# Patient Record
Sex: Female | Born: 1945
Health system: Southern US, Community
[De-identification: ages and names within clinical notes are randomized; demographics above are authoritative.]

## PROBLEM LIST (undated history)

## (undated) DIAGNOSIS — T7840XA Allergy, unspecified, initial encounter: Secondary | ICD-10-CM

## (undated) DIAGNOSIS — M1990 Unspecified osteoarthritis, unspecified site: Secondary | ICD-10-CM

## (undated) DIAGNOSIS — J383 Other diseases of vocal cords: Secondary | ICD-10-CM

## (undated) DIAGNOSIS — Z86718 Personal history of other venous thrombosis and embolism: Secondary | ICD-10-CM

## (undated) DIAGNOSIS — N950 Postmenopausal bleeding: Secondary | ICD-10-CM

## (undated) DIAGNOSIS — K219 Gastro-esophageal reflux disease without esophagitis: Secondary | ICD-10-CM

## (undated) DIAGNOSIS — F419 Anxiety disorder, unspecified: Secondary | ICD-10-CM

## (undated) HISTORY — DX: Other diseases of vocal cords: J38.3

## (undated) HISTORY — PX: EYE SURGERY: SHX253

## (undated) HISTORY — PX: AUGMENTATION MAMMAPLASTY: SUR837

## (undated) HISTORY — DX: Gastro-esophageal reflux disease without esophagitis: K21.9

## (undated) HISTORY — PX: COSMETIC SURGERY: SHX468

## (undated) HISTORY — PX: APPENDECTOMY: SHX54

## (undated) HISTORY — DX: Anxiety disorder, unspecified: F41.9

## (undated) HISTORY — PX: OTHER SURGICAL HISTORY: SHX169

## (undated) HISTORY — PX: CATARACT EXTRACTION W/ INTRAOCULAR LENS  IMPLANT, BILATERAL: SHX1307

## (undated) HISTORY — DX: Allergy, unspecified, initial encounter: T78.40XA

## (undated) HISTORY — PX: JOINT REPLACEMENT: SHX530

## (undated) HISTORY — PX: TONSILLECTOMY: SUR1361

---

## 1997-08-04 ENCOUNTER — Other Ambulatory Visit: Admission: RE | Admit: 1997-08-04 | Discharge: 1997-08-04 | Payer: Self-pay | Admitting: Obstetrics and Gynecology

## 1998-08-06 ENCOUNTER — Other Ambulatory Visit: Admission: RE | Admit: 1998-08-06 | Discharge: 1998-08-06 | Payer: Self-pay | Admitting: Obstetrics and Gynecology

## 1999-09-09 ENCOUNTER — Other Ambulatory Visit: Admission: RE | Admit: 1999-09-09 | Discharge: 1999-09-09 | Payer: Self-pay | Admitting: Obstetrics and Gynecology

## 2000-02-02 HISTORY — PX: BREAST ENHANCEMENT SURGERY: SHX7

## 2000-04-17 ENCOUNTER — Inpatient Hospital Stay: Admission: EM | Admit: 2000-04-17 | Discharge: 2000-04-19 | Payer: Self-pay | Admitting: Family Medicine

## 2000-04-18 ENCOUNTER — Encounter: Payer: Self-pay | Admitting: Family Medicine

## 2000-10-02 ENCOUNTER — Other Ambulatory Visit: Admission: RE | Admit: 2000-10-02 | Discharge: 2000-10-02 | Payer: Self-pay | Admitting: Obstetrics and Gynecology

## 2001-11-20 ENCOUNTER — Other Ambulatory Visit: Admission: RE | Admit: 2001-11-20 | Discharge: 2001-11-20 | Payer: Self-pay | Admitting: Obstetrics and Gynecology

## 2002-12-24 ENCOUNTER — Other Ambulatory Visit: Admission: RE | Admit: 2002-12-24 | Discharge: 2002-12-24 | Payer: Self-pay | Admitting: Obstetrics and Gynecology

## 2003-04-04 HISTORY — PX: ROTATOR CUFF REPAIR: SHX139

## 2003-09-02 ENCOUNTER — Encounter: Admission: RE | Admit: 2003-09-02 | Discharge: 2003-12-01 | Payer: Self-pay

## 2003-11-25 ENCOUNTER — Ambulatory Visit (HOSPITAL_COMMUNITY): Admission: RE | Admit: 2003-11-25 | Discharge: 2003-11-25 | Payer: Self-pay | Admitting: Gastroenterology

## 2003-11-25 ENCOUNTER — Encounter (INDEPENDENT_AMBULATORY_CARE_PROVIDER_SITE_OTHER): Payer: Self-pay | Admitting: Specialist

## 2003-12-24 ENCOUNTER — Other Ambulatory Visit: Admission: RE | Admit: 2003-12-24 | Discharge: 2003-12-24 | Payer: Self-pay | Admitting: Obstetrics and Gynecology

## 2005-01-31 ENCOUNTER — Other Ambulatory Visit: Admission: RE | Admit: 2005-01-31 | Discharge: 2005-01-31 | Payer: Self-pay | Admitting: Obstetrics and Gynecology

## 2009-04-03 HISTORY — PX: KNEE ARTHROSCOPY W/ MENISCECTOMY: SHX1879

## 2009-11-29 ENCOUNTER — Encounter: Admission: RE | Admit: 2009-11-29 | Discharge: 2009-11-29 | Payer: Self-pay | Admitting: Family Medicine

## 2010-08-19 NOTE — H&P (Signed)
[facility]  Patient:    Tamara Pope, Tamara Pope                         MRN: [id number] Adm. Date:  [id number] Attending:  Pamelia Hoit                         History and Physical  IDENTIFICATION:  [age]-year-old married white female from [place].  CHIEF COMPLAINT:  Swelling and tightness of the right upper arm starting this morning.  HISTORY OF PRESENT ILLNESS:  Patient was in her usual excellent state of health until this morning, when she woke up with a feeling of tightness and swelling in her right upper arm.  She noticed that it appeared swollen and slightly bruised.  It was not painful.  She came to the office, from where she was sent to [facility] where a CT scan and ultrasound were done of the area which revealed venous thrombosis; she is now admitted for further evaluation and treatment.  Two months ago, she had a replacement of bilateral breast implants.  Silicone implants which were leaking were removed and new silicone implants replaced without incident.  She had good healing without complications.  She has not had any kind of trauma since then.  Yesterday, she lifted some heavy pots x 2 and last night, she felt as if she may have strained a muscle in her arm.  She has not had any fever or chills or respiratory symptoms.  No pain.  PAST MEDICAL HISTORY:  No previous history of blood clotting problems.  Breast implants placed 13 years ago, replaced 10 years ago and 2 months ago.  No other surgery.  She has migraine headaches and allergies.  She is status post menopause and on hormone-replacement therapy.  Prempro was changed recently to femhrt.  Other medicines include Inderal LA to prevent migraines, Allegra-D once a day and Flonase one or two sprays per nostril daily.  SOCIAL HISTORY:  She has never smoked cigarettes and rarely drinks alcohol. 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  They travel a lot and recently went to [place] without incident; plan to go to [place] and [place] in one week.  They have two girls who are grown and well.  No grandchildren.  Patient works out regularly doing aerobics with no recent overuse or change in schedule.  ALLERGIES: She has an intolerance to STEROIDS, which cause her to feel jittery.  No known allergies.  FAMILY HISTORY:  Patients father died at age 71 of lung cancer.  Her mother is living at age 3 with rheumatoid arthritis.  She has one older brother who has diabetes associated with being overweight.  PHYSICAL EXAMINATION  GENERAL:  Examination reveals an attractive, young-for-age white female in no distress.  She is not obese.  Mental status is normal.  VITAL SIGNS:  Temperature 97.3, pulse 66, respirations 18, blood pressure 117/63.  Weight is 67 kg.  SKIN:  Her skin is tanned without lesions.  EXTREMITIES:  The right upper arm is swollen, slightly bruised and reddish in appearance.  The diameter of the upper and lower right arm is greater than that of the left arm.  She has full active range of motion of both arms and shoulders with normal strength bilaterally.  There is no tenderness or heat over the upper right arm.  Lower extremities are normal with no  edema.  Good pulses in feet and ankles and there are no joint abnormalities anywhere.  LYMPHATICS:  There is no axillary tenderness, mass or adenopathy.  No drainage.  There are no lymph nodes palpated at the neck or supraclavicular areas.  HEENT:  Unremarkable.  LUNGS:  Clear to auscultation.  HEART:  Regular without murmurs or extra sounds.  BREASTS:  No bruising, inflammation or swelling.  ABDOMEN:  Soft, nontender, without masses.  GU:  Deferred.  RECTAL:  Deferred.  LABORATORY AND X-RAY FINDINGS:  White count 12,000, 68% neutrophils, 23% lymphocytes, hemoglobin 14.1, platelets 303,000, MCV 88.6.  Pro time INR 1.0; PTT  26, normal.  Sodium 140, potassium 3.4, glucose 104, BUN 8, creatinine 0.6, calcium 9.8.  Liver functions are normal.  CT scan and ultrasound of right arm and shoulder are noted above but there is no written report here.  ASSESSMENT 1. Deep venous thrombosis with right upper arm with no recent trauma and no    history of same. 2. Hormone-replacement therapy. 3. Seasonal allergies. 4. Two months status post silicone breast implant replacements. 5. Migraine headaches.  PLAN:  Will obtain x-ray reports from [facility].  Heparin is started, as is Coumadin.  Tomorrow, we will change to Lovenox and plan for outpatient management when stable.  Will obtain hematology consultation for further advise.  For now, will hold off on hormone-replacement therapy but continue other medicines. DD:  04/17/00 TD:  04/18/00 Job: [id number]

## 2010-08-19 NOTE — Consult Note (Signed)
[facility]  Patient:    Tamara Pope, Tamara Pope                         MRN: [id number] Adm. Date:  [id number] Attending:  Pamelia Hoit                          Consultation Report  REFERRING PHYSICIAN:          Benedetto Goad, M.D.  REASON FOR CONSULT:           New onset right upper extremity DVT.  HISTORY OF CURRENT ILLNESS:   Patient was in a usual state of excellent health until morning of admission, January 15, when she began to feel tightness and pain, swelling in her right upper arm; it was swollen and slightly bruised. It was not very painful. She had a workup to include a CT scan and ultrasound of the area which revealed an extensive venous thrombosis. She was admitted for heparin therapy and is currently switched over to Lovenox while she is being Coumandinized.  SIGNIFICANT RECENT HISTORY:  Two months ago she had replacement of bilateral breast implants. Silicone implants were leaking. They were removed and new silicone implants were replaced without an incident. She has had no complications since then. She had good healing. She has had no trauma. She has had no period of immobilization with her upper extremities. Of note, she has recently, in the past two years, changed her hormone replacement therapy from Prempro to [facility].  PAST HISTORY:                 Significant for no history of blood clotting problems, breast implants placed 13 years ago, replaced 10 years ago and two months ago. She has had no other surgeries. She has migraine headaches and allergies. She is status post menopause and on hormone replacement therapy.  SOCIAL HISTORY:               She has never smoked cigarettes. She rarely drinks alcohol. She travels extensively. They have two grown children. She works out regularly doing aerobics. She has maintained good preventive health including mammograms which have been normal as well as Pap smears. She is currently on hormone  replacement therapy.  ALLERGIES:                    Has as intolerance to STEROIDS which cause her to feel jittery. She has no other known drug allergies.  FAMILY HISTORY:               Father died at age of 61 with lung cancer. Her mother is living at age of 14 with rheumatoid arthritis. She has one older brother who has diabetes associated with being overweight.  REVIEW OF SYSTEMS:            Right upper extremity swelling. Otherwise, no headache; no dizziness; no shortness of breath; no bowel or bladder changes; no focal neurologic complaints; no difficulty ambulating; no weight loss; no fever, chills; no night sweats.  PHYSICAL EXAMINATION:  VITAL SIGNS:                  She is afebrile. Vitals are stable.  CHEST:                        Clear.  HEART:  Regular rate and rhythm.  ABDOMEN:                      Soft, nontender to palpation. No hepatosplenomegaly by palpation.  EXTREMITIES:                  Full range of motion. No edema. She does have significant edema throughout the right upper extremity with some bruising.  NEURO:                        Alert and oriented x 3. Cranial nerves 2-12 grossly intact. Motor and sensory are intact globally.  IMPRESSION:                   Female with new onset right upper extremity deep vein thrombosis while on hormone replacement therapy. No known significant risk factors or hypercoagulability.  PLAN:                         At this point, I certainly agree with stopping her hormone replacement therapy and continue Coumadin for at least three months. During this period, I would certainly like to establish care with her and at the end of her Coumandinization, once her parameters have normalized, would consider a hypercoagulable workup; however, I believe this would be very low yield. However, it would reassure [place] that this secondary to the hormone replacement therapy. DD:  04/18/00 TD:  04/18/00 Job:  [id number]

## 2010-08-19 NOTE — Discharge Summary (Signed)
[facility]  Patient:    Tamara Pope, Tamara Pope                         MRN: [id number] Adm. Date:  [id number] Disc. Date: [id number] Attending:  Pamelia Hoit CC:         Delorse Lek, M.D. [facility]  Marnee Guarneri, M.D. Hematology   Discharge Summary  ADMISSION DIAGNOSIS:  Right upper extremity thrombosis.  DISCHARGE DIAGNOSES: 1. Thrombosis of the right axillary and subclavian vein, probably secondary to    postmenopausal hormone replacement therapy. 2. History of seasonal allergic rhinitis. 3. History of silicone breast implants two months prior to this admission. 4. History of migraine headaches.  HISTORY OF PRESENT ILLNESS:  This is a 65 year old married female from [place] who presented to the office on April 17, 2000, after she woke up feeling some tightness and swelling in her right upper extremity.  It also appeared to be slightly bruised.  There was no history of any pain, shortness of breath, or chest pain.  She did not recall any previous injury.  She was sent to [facility] where CT and ultrasound were done which showed an area of thrombosis within the entire axillary and subclavian vein with sluggish flow in the brachial vein.  The jugular vein was unremarkable.  She was admitted for IV heparin and to initiate Coumadin therapy.  There is no prior history of known clotting disorder.  She is a nonsmoker and rarely drinks alcohol.  ADMISSION LABORATORY DATA:  Her electrolytes were all normal with the exception of a minimally depressed potassium of 3.4.  Initial INR 1.0 with PTT of 26.  White blood count 12,000 with hemoglobin of 14.1, and platelet count of 303,000.  HOSPITAL COURSE:  The patient was admitted, and IV heparin was started initially, along with Coumadin 10 mg.  She was then switched over to Lovenox 70 mg subcutaneously q.12h. and Coumadin continued.  Hematology consult was obtained with  Dr. Aliene Altes who felt that the hormone replacement therapy was probably contributing to the clot, and that it would be less likely that she has a significant hypercoagulable state.  The patient did not have any complications during admission.  In fact, had really no pain whatsoever involving her upper extremity.  INR on the day of discharge, April 19, 2000, was 1.6.  The patient was able to inject her own Lovenox without any difficulty, and it was felt that she would have no problems maintaining this at home.  CONDITION ON DISCHARGE:  Stable.  DISCHARGE MEDICATIONS: 1. Coumadin 5 mg p.o. q.d. 2. Lovenox 80 mg subcutaneously b.i.d. until Coumadin therapeutic. 3. Allegra-D one b.i.d. 4. Inderal LA 80 mg p.o. q.d. 5. Flonase nasal 2 sprays to nostril q.d.  DISCHARGE INSTRUCTIONS: 1. She is instructed to do no heavy lifting. 2. Follow up promptly if she has any shortness of breath, chest pain, fevers,    or other difficulties.  DISCHARGE FOLLOWUP:  Saturday, April 21, 2000, in our office for repeat prothrombin time, and again Monday morning for repeat prothrombin time as well. DD:  04/19/00 TD:  04/20/00 Job: [id number]

## 2010-08-19 NOTE — Op Note (Signed)
[name], Tamara Pope                          ACCOUNT NO.:  [id number]   MEDICAL RECORD NO.:  [id number]                   PATIENT TYPE:  AMB   LOCATION:  ENDO                                 FACILITY:  [facility]   PHYSICIAN:  Anselmo Rod, M.D.               DATE OF BIRTH:  1945/12/25   DATE OF PROCEDURE:  11/25/2003  DATE OF DISCHARGE:                                 OPERATIVE REPORT   PROCEDURE PERFORMED:  Colonoscopy with snare polypectomy x1 and cold  biopsies x3.   ENDOSCOPIST:  Anselmo Rod, M.D.   INSTRUMENT USED:  Olympus video colonoscope.   INDICATIONS FOR PROCEDURE:  A 65 year old white female with a history of  constipation and laxative abuse undergoing a screening colonoscopy, rule out  colonic polyps, masses, etc..  There is no known family history of colon  cancer.   PREPROCEDURE PHYSICAL:  VITAL SIGNS:  Stable vital signs.  NECK:  Supple.  CHEST:  Clear to auscultation.  CARDIOVASCULAR:  S1 and S2 regular.  ABDOMEN:  Soft with normal bowel sounds.   DESCRIPTION OF PROCEDURE:  The patient was placed in left lateral decubitus  position, sedated with 60 mg of Demerol and 6 mg of Versed in slow  incremental doses.  Once the patient was adequately sedated and maintained  on low flow oxygen and continuous cardiac monitoring, the Olympus video  colonoscope was advanced from the rectum to the cecum with slight difficulty  but the patient had severe melanosis coli throughout the colon.  A small  sessile polyp was snared from 50 cm.  Three small polyps were biopsied from  40 to 50 cm.  They were all placed in the same bottle.  The appendiceal  orifice and ileocecal valve were clearly visualized and photographed.  Small  internal hemorrhoids were seen on retroflexion.  No masses or polyps were  seen in the transverse colon, right colon or cecum.  There were a few early  left-sided diverticula.  The patient tolerated the procedure well without  any immediate  complications.   IMPRESSION:  1. Small nonbleeding internal hemorrhoids.  2. Early left-sided diverticulosis.  3. One polyp snared from 50 cm and three small polyps biopsied from 40 to 50     cm.  4. Normal-appearing transverse colon, right colon and cecum, except for     severe melanosis coli throughout the colonic mucosa.   RECOMMENDATIONS:  1. Continue high fiber diet with liberal fluid intake.  2. Avoid nonsteroidals for the next four weeks.  3. Avoid laxatives in the future.  4. Avoid pathology results.  5. Repeat CR6 reading depending on pathology results.  6. Outpatient follow-up in the next two weeks or earlier if need be.  Anselmo Rod, M.D.    JNM/MEDQ  D:  11/25/2003  T:  11/25/2003  Job:  [id number]   cc:   Marjory Lies, M.D.  [address]  [place]  [place] [address]  Fax: [phone]

## 2011-10-11 DIAGNOSIS — Z9889 Other specified postprocedural states: Secondary | ICD-10-CM | POA: Insufficient documentation

## 2011-11-14 DIAGNOSIS — Z961 Presence of intraocular lens: Secondary | ICD-10-CM | POA: Insufficient documentation

## 2011-12-21 DIAGNOSIS — M1710 Unilateral primary osteoarthritis, unspecified knee: Secondary | ICD-10-CM | POA: Insufficient documentation

## 2011-12-21 DIAGNOSIS — S83249A Other tear of medial meniscus, current injury, unspecified knee, initial encounter: Secondary | ICD-10-CM | POA: Insufficient documentation

## 2012-12-02 HISTORY — PX: OTHER SURGICAL HISTORY: SHX169

## 2013-04-16 ENCOUNTER — Encounter (HOSPITAL_BASED_OUTPATIENT_CLINIC_OR_DEPARTMENT_OTHER): Payer: Self-pay | Admitting: *Deleted

## 2013-04-16 NOTE — Progress Notes (Signed)
NPO AFTER MN. ARRIVE AT 0700. NEEDS CBC.

## 2013-04-16 NOTE — H&P (Signed)
Tamara Pope  DICTATION # [id number] CSN# [id number]   [name], MD 04/16/2013 1:16 PM

## 2013-04-17 NOTE — H&P (Signed)
NAMEFABIAN, COCA                ACCOUNT NO.:  [id number]  MEDICAL RECORD NO.:  [id number]  LOCATION:                                 FACILITY:  PHYSICIAN:  Ralene Bathe. Matthew Saras, M.D.DATE OF BIRTH:  1945-10-31  DATE OF ADMISSION: DATE OF DISCHARGE:                             HISTORY & PHYSICAL   CHIEF COMPLAINT:  Postmenopausal bleeding.  HISTORY OF PRESENT ILLNESS:  A 68 year old, G2, P2, postmenopausal patient who is on HRT, she has been on Minivelle and nightly progesterone, which has worked extremely well for her without prior problems related to postmenopausal bleeding.  Was on vacation last week, when she experienced some cramping and bleeding, was told to come into the office this week for evaluation with [facility].  Sonohysterogram in our office demonstrated possible adenomyosis with question of a 2.1-cm intramural fibroid with some blood clot noted.  On saline infusion, could not see the cavity well due to some bleeding and clots.  She presents now for hysteroscopy, D and C, with possible resection of polyp or small fibroid depending on the findings.  This procedure including specific risks related to bleeding, infection, other complications or may require additional surgery discussed with her, which she understands and accepts.  Pap in April 2014 was normal.  PAST MEDICAL HISTORY:  ALLERGIES:  None.  CURRENT MEDICATIONS:  Effexor, Minivelle and nightly progesterone.  OBSTETRICAL HISTORY:  Two vaginal deliveries.  She has had prior breast augmentation.  Last mammogram showed a possible leaking implant, which was followed up by her plastic surgeon.  She has also had appendectomy, tonsillectomy and rotator cuff repair.  SOCIAL HISTORY:  Denies drug or tobacco use, one alcoholic drink per day.  REVIEW OF SYSTEMS:  Significant for arthritis, prior meniscus tear.  FAMILY HISTORY:  Significant for unspecified cancer and arthritis.  PHYSICAL EXAMINATION:  VITAL SIGNS:   Temperature 98.2, blood pressure 120/72. HEENT:  Unremarkable. NECK:  Supple without masses. LUNGS:  Clear. CARDIOVASCULAR:  Regular rate and rhythm without murmurs, rubs, or gallops. BREASTS:  Without masses.  She does have implants. ABDOMEN:  Soft, flat, nontender.  Vulva, vagina, cervix normal except for some small amount of clotting at the internal or at the external os. Uterus itself was upper limit of normal size.  Adnexa negative. EXTREMITIES:  Unremarkable. NEUROLOGIC:  Unremarkable.  IMPRESSION:  Postmenopausal bleeding, currently on hormone replacement therapy, possible adenomyosis or endometrial polyps versus submucous fibroid.  PLAN:  D and C, hysteroscopy with resectoscope.  Procedure and risks discussed as above.     Kalaya Infantino M. Matthew Saras, M.D.     RMH/MEDQ  D:  04/16/2013  T:  04/17/2013  Job:  [id number]

## 2013-04-18 ENCOUNTER — Ambulatory Visit (HOSPITAL_BASED_OUTPATIENT_CLINIC_OR_DEPARTMENT_OTHER)
Admission: RE | Admit: 2013-04-18 | Discharge: 2013-04-18 | Disposition: A | Discharge status: Home / Self Care | Payer: PRIVATE HEALTH INSURANCE | Source: Ambulatory Visit | Attending: Obstetrics and Gynecology | Admitting: Obstetrics and Gynecology

## 2013-04-18 ENCOUNTER — Encounter (HOSPITAL_BASED_OUTPATIENT_CLINIC_OR_DEPARTMENT_OTHER): Payer: Self-pay

## 2013-04-18 ENCOUNTER — Encounter (HOSPITAL_BASED_OUTPATIENT_CLINIC_OR_DEPARTMENT_OTHER)
Admission: RE | Disposition: A | Discharge status: Home / Self Care | Payer: Self-pay | Source: Ambulatory Visit | Attending: Obstetrics and Gynecology

## 2013-04-18 ENCOUNTER — Ambulatory Visit (HOSPITAL_BASED_OUTPATIENT_CLINIC_OR_DEPARTMENT_OTHER): Payer: PRIVATE HEALTH INSURANCE | Admitting: Anesthesiology

## 2013-04-18 ENCOUNTER — Encounter (HOSPITAL_BASED_OUTPATIENT_CLINIC_OR_DEPARTMENT_OTHER): Payer: PRIVATE HEALTH INSURANCE | Admitting: Anesthesiology

## 2013-04-18 DIAGNOSIS — M129 Arthropathy, unspecified: Secondary | ICD-10-CM | POA: Insufficient documentation

## 2013-04-18 DIAGNOSIS — R109 Unspecified abdominal pain: Secondary | ICD-10-CM | POA: Insufficient documentation

## 2013-04-18 DIAGNOSIS — N950 Postmenopausal bleeding: Secondary | ICD-10-CM | POA: Insufficient documentation

## 2013-04-18 HISTORY — DX: Personal history of other venous thrombosis and embolism: Z86.718

## 2013-04-18 HISTORY — PX: DILATATION & CURRETTAGE/HYSTEROSCOPY WITH RESECTOCOPE: SHX5572

## 2013-04-18 HISTORY — DX: Postmenopausal bleeding: N95.0

## 2013-04-18 LAB — CBC
HEMATOCRIT: 36.2 % (ref 36.0–46.0)
Hemoglobin: 12.4 g/dL (ref 12.0–15.0)
MCH: 31.2 pg (ref 26.0–34.0)
MCHC: 34.3 g/dL (ref 30.0–36.0)
MCV: 91.2 fL (ref 78.0–100.0)
Platelets: 287 10*3/uL (ref 150–400)
RBC: 3.97 MIL/uL (ref 3.87–5.11)
RDW: 12.7 % (ref 11.5–15.5)
WBC: 8 10*3/uL (ref 4.0–10.5)

## 2013-04-18 SURGERY — DILATATION & CURETTAGE/HYSTEROSCOPY WITH RESECTOCOPE
Anesthesia: Monitor Anesthesia Care | Site: Uterus

## 2013-04-18 MED ORDER — CEFOTETAN DISODIUM-DEXTROSE 2-2.08 GM-% IV SOLR
INTRAVENOUS | Status: AC
  Filled 2013-04-18: qty 50

## 2013-04-18 MED ORDER — KETOROLAC TROMETHAMINE 30 MG/ML IJ SOLN
INTRAMUSCULAR | Status: DC | PRN
  Administered 2013-04-18: 15 mg via INTRAVENOUS

## 2013-04-18 MED ORDER — MIDAZOLAM HCL 2 MG/2ML IJ SOLN
INTRAMUSCULAR | Status: AC
  Filled 2013-04-18: qty 2

## 2013-04-18 MED ORDER — MIDAZOLAM HCL 5 MG/5ML IJ SOLN
INTRAMUSCULAR | Status: DC | PRN
  Administered 2013-04-18: 2 mg via INTRAVENOUS

## 2013-04-18 MED ORDER — LIDOCAINE HCL 1 % IJ SOLN
INTRAMUSCULAR | Status: DC | PRN
  Administered 2013-04-18: 9 mL

## 2013-04-18 MED ORDER — LACTATED RINGERS IV SOLN
INTRAVENOUS | Status: DC
  Administered 2013-04-18: 08:00:00 via INTRAVENOUS
  Filled 2013-04-18: qty 1000

## 2013-04-18 MED ORDER — FENTANYL CITRATE 0.05 MG/ML IJ SOLN
INTRAMUSCULAR | Status: DC | PRN
Start: 2013-04-18 — End: 2013-04-18
  Administered 2013-04-18: 50 ug via INTRAVENOUS

## 2013-04-18 MED ORDER — PROPOFOL 10 MG/ML IV EMUL
INTRAVENOUS | Status: DC | PRN
  Administered 2013-04-18: 140 ug/kg/min via INTRAVENOUS

## 2013-04-18 MED ORDER — STERILE WATER FOR IRRIGATION IR SOLN
Status: DC | PRN
  Administered 2013-04-18: 1000 mL

## 2013-04-18 MED ORDER — DEXTROSE 5 % IV SOLN
2.0000 g | INTRAVENOUS | Status: AC
  Administered 2013-04-18: 2 g via INTRAVENOUS
  Filled 2013-04-18: qty 2

## 2013-04-18 MED ORDER — ONDANSETRON HCL 4 MG/2ML IJ SOLN
INTRAMUSCULAR | Status: DC | PRN
  Administered 2013-04-18: 4 mg via INTRAVENOUS

## 2013-04-18 MED ORDER — GLYCINE 1.5 % IR SOLN
Status: DC | PRN
  Administered 2013-04-18: 3000 mL

## 2013-04-18 MED ORDER — FENTANYL CITRATE 0.05 MG/ML IJ SOLN
INTRAMUSCULAR | Status: AC
  Filled 2013-04-18: qty 4

## 2013-04-18 MED ORDER — IBUPROFEN 600 MG PO TABS: 600.0000 mg | ORAL_TABLET | Freq: Four times a day (QID) | ORAL | Status: DC | PRN

## 2013-04-18 SURGICAL SUPPLY — 37 items
CANISTER SUCTION 2500CC (MISCELLANEOUS) ×3 IMPLANT
CATH ROBINSON RED A/P 16FR (CATHETERS) IMPLANT
CLOTH BEACON ORANGE TIMEOUT ST (SAFETY) ×3 IMPLANT
CORD ACTIVE DISPOSABLE (ELECTRODE)
CORD ELECTRO ACTIVE DISP (ELECTRODE) IMPLANT
COVER TABLE BACK 60X90 (DRAPES) ×3 IMPLANT
DRAPE CAMERA CLOSED 9X96 (DRAPES) ×3 IMPLANT
DRAPE LG THREE QUARTER DISP (DRAPES) ×3 IMPLANT
DRESSING TELFA 8X3 (GAUZE/BANDAGES/DRESSINGS) ×3 IMPLANT
ELECT LOOP GYNE PRO 24FR (CUTTING LOOP)
ELECT REM PT RETURN 9FT ADLT (ELECTROSURGICAL) ×3
ELECT VAPORTRODE GRVD BAR (ELECTRODE) IMPLANT
ELECTRODE LOOP GYNE PRO 24FR (CUTTING LOOP) IMPLANT
ELECTRODE REM PT RTRN 9FT ADLT (ELECTROSURGICAL) ×1 IMPLANT
ELECTRODE ROLLER BARREL 22FR (ELECTROSURGICAL) IMPLANT
ELECTRODE VAPORCUT 22FR (ELECTROSURGICAL) IMPLANT
GLOVE BIO SURGEON STRL SZ7 (GLOVE) ×6 IMPLANT
GLOVE BIOGEL PI IND STRL 7.5 (GLOVE) IMPLANT
GLOVE BIOGEL PI INDICATOR 7.5 (GLOVE) ×6
GLOVE SKINSENSE NS SZ7.0 (GLOVE) ×2
GLOVE SKINSENSE STRL SZ7.0 (GLOVE) IMPLANT
GLYCINE 1.5% IRRIG UROMATIC (IV SOLUTION) ×2 IMPLANT
GOWN PREVENTION PLUS LG XLONG (DISPOSABLE) ×1 IMPLANT
GOWN STRL REIN XL XLG (GOWN DISPOSABLE) ×1 IMPLANT
GOWN STRL REUS W/TWL LRG LVL3 (GOWN DISPOSABLE) ×6 IMPLANT
GOWN STRL REUS W/TWL XL LVL3 (GOWN DISPOSABLE) ×2 IMPLANT
LEGGING LITHOTOMY PAIR STRL (DRAPES) ×3 IMPLANT
LOOP ANGLED CUTTING 22FR (CUTTING LOOP) IMPLANT
NDL SPNL 22GX3.5 QUINCKE BK (NEEDLE) IMPLANT
NEEDLE SPNL 22GX3.5 QUINCKE BK (NEEDLE) ×3 IMPLANT
PACK BASIN DAY SURGERY FS (CUSTOM PROCEDURE TRAY) ×3 IMPLANT
PAD OB MATERNITY 4.3X12.25 (PERSONAL CARE ITEMS) ×3 IMPLANT
PAD PREP 24X48 CUFFED NSTRL (MISCELLANEOUS) ×3 IMPLANT
SYR CONTROL 10ML LL (SYRINGE) ×2 IMPLANT
TOWEL OR 17X24 6PK STRL BLUE (TOWEL DISPOSABLE) ×6 IMPLANT
TUBING HYDROFLEX HYSTEROSCOPY (TUBING) ×5 IMPLANT
WATER STERILE IRR 500ML POUR (IV SOLUTION) ×3 IMPLANT

## 2013-04-18 NOTE — Transfer of Care (Signed)
Immediate Anesthesia Transfer of Care Note  Patient: Tamara Pope  Procedure(s) Performed: Procedure(s): DILATATION & CURETTAGE/HYSTEROSCOPY WITH RESECTOCOPE (N/A)  Patient Location: PACU  Anesthesia Type:MAC  Level of Consciousness: sedated and responds to stimulation  Airway & Oxygen Therapy: Patient Spontanous Breathing and Patient connected to face mask oxygen  Post-op Assessment: Report given to PACU RN  Post vital signs: Reviewed and stable  Complications: No apparent anesthesia complications

## 2013-04-18 NOTE — Anesthesia Postprocedure Evaluation (Signed)
Anesthesia Post Note  Patient: Tamara Pope  Procedure(s) Performed: Procedure(s) (LRB): DILATATION & CURETTAGE/HYSTEROSCOPY WITH RESECTOCOPE (N/A)  Anesthesia type: MAC  Patient location: PACU  Post pain: Pain level controlled  Post assessment: Post-op Vital signs reviewed  Last Vitals:  Filed Vitals:   04/18/13 0914  BP: 111/60  Pulse: 69  Temp: 36.3 C  Resp: 15    Post vital signs: Reviewed  Level of consciousness: sedated  Complications: No apparent anesthesia complications

## 2013-04-18 NOTE — Op Note (Signed)
Preoperative diagnosis: Postmenopausal bleeding  Postoperative diagnosis: Same  Procedure: D&C, hysteroscopy  Surgeon: Matthew Saras  Anesthesia: Local plus mask general  EBL: Less than 50 cc  Specimens removed: Endometrial curettings, to pathology  Procedure and findings:  Patient was taken the operating room after an adequate level of mask anesthesia was obtained with the legs in stirrups the perineum and vagina were prepped and draped in the usual fashion and the bladder was drained. Appropriate timeout for taken prior to prepping. EUA was carried out the uterus was normal size, mobile adnexa negative. Speculum was positioned, paracervical block was then created by infiltrating at 3 and 9:00 submucosally 5-7 cc 1% plain Xylocaine at each site after negative aspiration. The uterus was sounded to 8 cm, progressively dilated to a 27-29 Pratt dilator, the continuous flow hysteroscope was inserted and a small to moderate amount of tissue buildup was noted, therefore the scope was removed sharp curettage was carried out minimal tissue was noted sent to pathology, the scope was reinserted the cavity was irrigated and the findings as follows  The fundus an endometrial layers were unremarkable, there was slight erythema from the curettage no residual polyps there was a very small right lower intramural fibroid that was partially protruding into the cavity but pretty minimally. No other abnormalities were noted these findings for further documented. Instruments removed. She tolerated this well went to recovery room in good condition.  Dictated with [facility]  Tamara Pope M. Garry Heater.D.

## 2013-04-18 NOTE — Anesthesia Preprocedure Evaluation (Signed)
Anesthesia Evaluation  Patient identified by MRN, date of birth, ID band Patient awake    Reviewed: Allergy & Precautions, H&P , NPO status , Patient's Chart, lab work & pertinent test results  Airway Mallampati: II TM Distance: >3 FB Neck ROM: Full    Dental  (+) Caps and Teeth Intact   Pulmonary neg pulmonary ROS,  breath sounds clear to auscultation  Pulmonary exam normal       Cardiovascular DVT Rhythm:Regular Rate:Normal     Neuro/Psych negative neurological ROS  negative psych ROS   GI/Hepatic negative GI ROS, Neg liver ROS,   Endo/Other  negative endocrine ROS  Renal/GU negative Renal ROS  negative genitourinary   Musculoskeletal negative musculoskeletal ROS (+)   Abdominal   Peds  Hematology negative hematology ROS (+)   Anesthesia Other Findings   Reproductive/Obstetrics                           Anesthesia Physical Anesthesia Plan  ASA: II  Anesthesia Plan: MAC   Post-op Pain Management:    Induction: Intravenous  Airway Management Planned: Simple Face Mask  Additional Equipment:   Intra-op Plan:   Post-operative Plan: Extubation in OR  Informed Consent: I have reviewed the patients History and Physical, chart, labs and discussed the procedure including the risks, benefits and alternatives for the proposed anesthesia with the patient or authorized representative who has indicated his/her understanding and acceptance.   Dental advisory given  Plan Discussed with: CRNA  Anesthesia Plan Comments:         Anesthesia Quick Evaluation

## 2013-04-18 NOTE — Progress Notes (Signed)
The patient was re-examined with no change in status 

## 2013-04-18 NOTE — Discharge Instructions (Signed)

## 2013-04-21 ENCOUNTER — Encounter (HOSPITAL_BASED_OUTPATIENT_CLINIC_OR_DEPARTMENT_OTHER): Payer: Self-pay | Admitting: Obstetrics and Gynecology

## 2014-08-16 DIAGNOSIS — H17813 Minor opacity of cornea, bilateral: Secondary | ICD-10-CM | POA: Insufficient documentation

## 2014-10-14 ENCOUNTER — Other Ambulatory Visit: Payer: Self-pay | Admitting: Obstetrics and Gynecology

## 2014-10-15 LAB — CYTOLOGY - PAP

## 2015-01-05 ENCOUNTER — Other Ambulatory Visit: Payer: Self-pay | Admitting: Obstetrics and Gynecology

## 2015-01-05 DIAGNOSIS — R928 Other abnormal and inconclusive findings on diagnostic imaging of breast: Secondary | ICD-10-CM

## 2015-01-21 ENCOUNTER — Ambulatory Visit
Admission: RE | Admit: 2015-01-21 | Discharge: 2015-01-21 | Disposition: A | Discharge status: Home / Self Care | Payer: 59 | Source: Ambulatory Visit | Attending: Obstetrics and Gynecology | Admitting: Obstetrics and Gynecology

## 2015-01-21 ENCOUNTER — Other Ambulatory Visit: Payer: PRIVATE HEALTH INSURANCE

## 2015-01-21 DIAGNOSIS — R928 Other abnormal and inconclusive findings on diagnostic imaging of breast: Secondary | ICD-10-CM

## 2015-03-10 ENCOUNTER — Other Ambulatory Visit: Payer: Self-pay | Admitting: Orthopedic Surgery

## 2015-04-19 ENCOUNTER — Encounter (HOSPITAL_COMMUNITY)
Admission: RE | Admit: 2015-04-19 | Discharge: 2015-04-19 | Disposition: A | Discharge status: Home / Self Care | Payer: 59 | Source: Ambulatory Visit | Attending: Orthopedic Surgery | Admitting: Orthopedic Surgery

## 2015-04-19 ENCOUNTER — Encounter (HOSPITAL_COMMUNITY): Payer: Self-pay

## 2015-04-19 DIAGNOSIS — Z01812 Encounter for preprocedural laboratory examination: Secondary | ICD-10-CM | POA: Diagnosis not present

## 2015-04-19 DIAGNOSIS — M1711 Unilateral primary osteoarthritis, right knee: Secondary | ICD-10-CM | POA: Insufficient documentation

## 2015-04-19 DIAGNOSIS — R918 Other nonspecific abnormal finding of lung field: Secondary | ICD-10-CM | POA: Diagnosis not present

## 2015-04-19 DIAGNOSIS — Z01818 Encounter for other preprocedural examination: Secondary | ICD-10-CM | POA: Diagnosis present

## 2015-04-19 HISTORY — DX: Unspecified osteoarthritis, unspecified site: M19.90

## 2015-04-19 LAB — URINALYSIS, ROUTINE W REFLEX MICROSCOPIC
Bilirubin Urine: NEGATIVE
Glucose, UA: NEGATIVE mg/dL
Hgb urine dipstick: NEGATIVE
KETONES UR: NEGATIVE mg/dL
LEUKOCYTES UA: NEGATIVE
NITRITE: NEGATIVE
PROTEIN: NEGATIVE mg/dL
Specific Gravity, Urine: 1.017 (ref 1.005–1.030)
pH: 8 (ref 5.0–8.0)

## 2015-04-19 LAB — COMPREHENSIVE METABOLIC PANEL
ALT: 25 U/L (ref 14–54)
AST: 27 U/L (ref 15–41)
Albumin: 4.2 g/dL (ref 3.5–5.0)
Alkaline Phosphatase: 82 U/L (ref 38–126)
Anion gap: 10 (ref 5–15)
BUN: 14 mg/dL (ref 6–20)
CHLORIDE: 104 mmol/L (ref 101–111)
CO2: 24 mmol/L (ref 22–32)
Calcium: 9.4 mg/dL (ref 8.9–10.3)
Creatinine, Ser: 0.6 mg/dL (ref 0.44–1.00)
Glucose, Bld: 122 mg/dL — ABNORMAL HIGH (ref 65–99)
POTASSIUM: 4.4 mmol/L (ref 3.5–5.1)
Sodium: 138 mmol/L (ref 135–145)
Total Bilirubin: 0.7 mg/dL (ref 0.3–1.2)
Total Protein: 7.5 g/dL (ref 6.5–8.1)

## 2015-04-19 LAB — CBC WITH DIFFERENTIAL/PLATELET
BASOS ABS: 0 10*3/uL (ref 0.0–0.1)
Basophils Relative: 0 %
EOS ABS: 0.2 10*3/uL (ref 0.0–0.7)
EOS PCT: 2 %
HCT: 44.9 % (ref 36.0–46.0)
Hemoglobin: 15 g/dL (ref 12.0–15.0)
LYMPHS PCT: 25 %
Lymphs Abs: 2 10*3/uL (ref 0.7–4.0)
MCH: 30.8 pg (ref 26.0–34.0)
MCHC: 33.4 g/dL (ref 30.0–36.0)
MCV: 92.2 fL (ref 78.0–100.0)
MONO ABS: 0.5 10*3/uL (ref 0.1–1.0)
Monocytes Relative: 7 %
Neutro Abs: 5.2 10*3/uL (ref 1.7–7.7)
Neutrophils Relative %: 66 %
PLATELETS: 319 10*3/uL (ref 150–400)
RBC: 4.87 MIL/uL (ref 3.87–5.11)
RDW: 12.7 % (ref 11.5–15.5)
WBC: 8 10*3/uL (ref 4.0–10.5)

## 2015-04-19 LAB — PROTIME-INR
INR: 1.09 (ref 0.00–1.49)
PROTHROMBIN TIME: 14.3 s (ref 11.6–15.2)

## 2015-04-19 LAB — URINE MICROSCOPIC-ADD ON

## 2015-04-19 LAB — SURGICAL PCR SCREEN
MRSA, PCR: POSITIVE — AB
STAPHYLOCOCCUS AUREUS: POSITIVE — AB

## 2015-04-19 LAB — APTT: APTT: 31 s (ref 24–37)

## 2015-04-19 NOTE — Progress Notes (Signed)
Script for Mupirocin called into [facility]  Message left for Ms Flanery related to positive results from PCR screen, and the need to pick up her script.

## 2015-04-19 NOTE — Pre-Procedure Instructions (Signed)
Tamara Pope  04/19/2015      [id number] - [place], [address] AT [facility] [address] [place] [address] Phone: [phone] Fax: [phone]    Your procedure is scheduled on Jan 30  Report to [place] at 530 A.M.  Call this number if you have problems the morning of surgery:  [phone]   Remember:  Do not eat food or drink liquids after midnight.  Take these medicines the morning of surgery with A SIP OF WATER Cetirizine (Zyrtec), Flonase Nasal spray if needed, venlafaxine XR (Effexor-XR), progesterone (Prometrium)  Stop taking Asprin, Ibuprofen, Advil, Motrin, Aleve, BC's, Goody's, Herbal medications, Fish Oil, Vitamins   Do not wear jewelry, make-up or nail polish.  Do not wear lotions, powders, or perfumes.  You may wear deodorant.  Do not shave 48 hours prior to surgery.  Men may shave face and neck.  Do not bring valuables to the hospital.  [facility] is not responsible for any belongings or valuables.  Contacts, dentures or bridgework may not be worn into surgery.  Leave your suitcase in the car.  After surgery it may be brought to your room.  For patients admitted to the hospital, discharge time will be determined by your treatment team.  Patients discharged the day of surgery will not be allowed to drive home.    Special instructions:  [place] - Preparing for Surgery  Before surgery, you can play an important role.  Because skin is not sterile, your skin needs to be as free of germs as possible.  You can reduce the number of germs on you skin by washing with CHG (chlorahexidine gluconate) soap before surgery.  CHG is an antiseptic cleaner which kills germs and bonds with the skin to continue killing germs even after washing.  Please DO NOT use if you have an allergy to CHG or antibacterial soaps.  If your skin becomes reddened/irritated stop using the CHG and inform your  nurse when you arrive at Short Stay.  Do not shave (including legs and underarms) for at least 48 hours prior to the first CHG shower.  You may shave your face.  Please follow these instructions carefully:   1.  Shower with CHG Soap the night before surgery and the    morning of Surgery.  2.  If you choose to wash your hair, wash your hair first as usual with your  normal shampoo.  3.  After you shampoo, rinse your hair and body thoroughly to remove the  Shampoo.  4.  Use CHG as you would any other liquid soap.  You can apply chg directly    to the skin and wash gently with scrungie or a clean washcloth.  5.  Apply the CHG Soap to your body ONLY FROM THE NECK DOWN.    Do not use on open wounds or open sores.  Avoid contact with your eyes,   ears, mouth and genitals (private parts).  Wash genitals (private parts)  with your normal soap.  6.  Wash thoroughly, paying special attention to the area where your surgery  will be performed.  7.  Thoroughly rinse your body with warm water from the neck down.  8.  DO NOT shower/wash with your normal soap after using and rinsing off the CHG Soap.  9.  Pat yourself dry with a clean towel.  10.  Wear clean pajamas.            11.  Place clean sheets on your bed the night of your first shower and do not sleep with pets.  Day of Surgery  Do not apply any lotions/deoderants the morning of surgery.  Please wear clean clothes to the hospital/surgery center.     Please read over the following fact sheets that you were given. Pain Booklet, Coughing and Deep Breathing, MRSA Information and Surgical Site Infection Prevention

## 2015-04-19 NOTE — Progress Notes (Signed)
PCP is Dr. Juanita Craver Denies seeing a cardiologist. Denies having a recent CXR or EKG Denies ever having a card cath, stress test, or echo.

## 2015-04-20 LAB — URINE CULTURE

## 2015-04-30 MED ORDER — CEFAZOLIN SODIUM-DEXTROSE 2-3 GM-% IV SOLR
2.0000 g | INTRAVENOUS | Status: AC
Start: 2015-05-03 — End: 2015-05-03
  Administered 2015-05-03: 2 g via INTRAVENOUS
  Filled 2015-04-30: qty 50

## 2015-04-30 MED ORDER — CHLORHEXIDINE GLUCONATE 4 % EX LIQD: 60.0000 mL | Freq: Once | CUTANEOUS | Status: DC

## 2015-04-30 MED ORDER — BUPIVACAINE LIPOSOME 1.3 % IJ SUSP
20.0000 mL | Freq: Once | INTRAMUSCULAR | Status: AC
  Administered 2015-05-03: 20 mL
  Filled 2015-04-30: qty 20

## 2015-04-30 MED ORDER — TRANEXAMIC ACID 1000 MG/10ML IV SOLN
1000.0000 mg | INTRAVENOUS | Status: AC
  Administered 2015-05-03: 1000 mg via INTRAVENOUS
  Filled 2015-04-30: qty 10

## 2015-04-30 MED ORDER — SODIUM CHLORIDE 0.9 % IV SOLN: INTRAVENOUS | Status: DC

## 2015-05-02 NOTE — Anesthesia Preprocedure Evaluation (Addendum)
Anesthesia Evaluation  Patient identified by MRN, date of birth, ID band Patient awake    Reviewed: Allergy & Precautions, Patient's Chart, lab work & pertinent test results  History of Anesthesia Complications Negative for: history of anesthetic complications  Airway Mallampati: II  TM Distance: >3 FB Neck ROM: Full    Dental  (+) Teeth Intact, Dental Advisory Given   Pulmonary neg pulmonary ROS,    Pulmonary exam normal        Cardiovascular negative cardio ROS Normal cardiovascular exam     Neuro/Psych negative neurological ROS  negative psych ROS   GI/Hepatic negative GI ROS,   Endo/Other  negative endocrine ROS  Renal/GU      Musculoskeletal   Abdominal   Peds  Hematology negative hematology ROS (+)   Anesthesia Other Findings   Reproductive/Obstetrics                            Anesthesia Physical Anesthesia Plan  ASA: II  Anesthesia Plan: MAC and Spinal   Post-op Pain Management:    Induction:   Airway Management Planned: Simple Face Mask and Natural Airway  Additional Equipment:   Intra-op Plan:   Post-operative Plan: Extubation in OR  Informed Consent: I have reviewed the patients History and Physical, chart, labs and discussed the procedure including the risks, benefits and alternatives for the proposed anesthesia with the patient or authorized representative who has indicated his/her understanding and acceptance.   Dental advisory given  Plan Discussed with: CRNA, Anesthesiologist and Surgeon  Anesthesia Plan Comments:        Anesthesia Quick Evaluation

## 2015-05-03 ENCOUNTER — Inpatient Hospital Stay (HOSPITAL_COMMUNITY): Payer: 59 | Admitting: Anesthesiology

## 2015-05-03 ENCOUNTER — Inpatient Hospital Stay (HOSPITAL_COMMUNITY): Payer: 59 | Admitting: Emergency Medicine

## 2015-05-03 ENCOUNTER — Encounter (HOSPITAL_COMMUNITY): Payer: Self-pay | Admitting: *Deleted

## 2015-05-03 ENCOUNTER — Encounter (HOSPITAL_COMMUNITY)
Admission: RE | Disposition: A | Discharge status: Home Health | Payer: Self-pay | Source: Ambulatory Visit | Attending: Orthopedic Surgery

## 2015-05-03 ENCOUNTER — Inpatient Hospital Stay (HOSPITAL_COMMUNITY)
Admission: RE | Admit: 2015-05-03 | Discharge: 2015-05-04 | DRG: 470 | Disposition: A | Discharge status: Home Health | Payer: 59 | Source: Ambulatory Visit | Attending: Orthopedic Surgery | Admitting: Orthopedic Surgery

## 2015-05-03 DIAGNOSIS — Z9842 Cataract extraction status, left eye: Secondary | ICD-10-CM

## 2015-05-03 DIAGNOSIS — Z9882 Breast implant status: Secondary | ICD-10-CM | POA: Diagnosis not present

## 2015-05-03 DIAGNOSIS — Z9841 Cataract extraction status, right eye: Secondary | ICD-10-CM

## 2015-05-03 DIAGNOSIS — Z961 Presence of intraocular lens: Secondary | ICD-10-CM | POA: Diagnosis present

## 2015-05-03 DIAGNOSIS — M1711 Unilateral primary osteoarthritis, right knee: Principal | ICD-10-CM | POA: Diagnosis present

## 2015-05-03 DIAGNOSIS — Z96659 Presence of unspecified artificial knee joint: Secondary | ICD-10-CM

## 2015-05-03 DIAGNOSIS — Z86718 Personal history of other venous thrombosis and embolism: Secondary | ICD-10-CM

## 2015-05-03 DIAGNOSIS — D62 Acute posthemorrhagic anemia: Secondary | ICD-10-CM | POA: Diagnosis not present

## 2015-05-03 HISTORY — PX: TOTAL KNEE ARTHROPLASTY: SHX125

## 2015-05-03 LAB — CBC
HEMATOCRIT: 38.9 % (ref 36.0–46.0)
Hemoglobin: 13.2 g/dL (ref 12.0–15.0)
MCH: 31.4 pg (ref 26.0–34.0)
MCHC: 33.9 g/dL (ref 30.0–36.0)
MCV: 92.4 fL (ref 78.0–100.0)
Platelets: 260 10*3/uL (ref 150–400)
RBC: 4.21 MIL/uL (ref 3.87–5.11)
RDW: 13.2 % (ref 11.5–15.5)
WBC: 13.9 10*3/uL — AB (ref 4.0–10.5)

## 2015-05-03 LAB — CREATININE, SERUM
Creatinine, Ser: 0.6 mg/dL (ref 0.44–1.00)
GFR calc non Af Amer: >60 mL/min (ref >60)

## 2015-05-03 SURGERY — ARTHROPLASTY, KNEE, TOTAL
Anesthesia: Monitor Anesthesia Care | Site: Knee | Laterality: Right

## 2015-05-03 MED ORDER — CEFAZOLIN SODIUM 1-5 GM-% IV SOLN
1.0000 g | Freq: Four times a day (QID) | INTRAVENOUS | Status: AC
  Administered 2015-05-03 (×2): 1 g via INTRAVENOUS
  Filled 2015-05-03 (×2): qty 50

## 2015-05-03 MED ORDER — BUPIVACAINE-EPINEPHRINE (PF) 0.25% -1:200000 IJ SOLN
INTRAMUSCULAR | Status: AC
  Filled 2015-05-03: qty 30

## 2015-05-03 MED ORDER — ONDANSETRON HCL 4 MG/2ML IJ SOLN
INTRAMUSCULAR | Status: AC
  Filled 2015-05-03: qty 2

## 2015-05-03 MED ORDER — PROPOFOL 500 MG/50ML IV EMUL
INTRAVENOUS | Status: DC | PRN
  Administered 2015-05-03: 50 ug/kg/min via INTRAVENOUS

## 2015-05-03 MED ORDER — HYDROMORPHONE HCL 1 MG/ML IJ SOLN
1.0000 mg | INTRAMUSCULAR | Status: DC | PRN
  Administered 2015-05-03 – 2015-05-04 (×2): 1 mg via INTRAVENOUS
  Filled 2015-05-03 (×3): qty 1

## 2015-05-03 MED ORDER — BUPIVACAINE IN DEXTROSE 0.75-8.25 % IT SOLN
INTRATHECAL | Status: DC | PRN
  Administered 2015-05-03: 15 mg via INTRATHECAL

## 2015-05-03 MED ORDER — SODIUM CHLORIDE 0.9 % IJ SOLN
INTRAMUSCULAR | Status: DC | PRN
  Administered 2015-05-03: 20 mL via INTRAVENOUS

## 2015-05-03 MED ORDER — LACTATED RINGERS IV SOLN
INTRAVENOUS | Status: DC | PRN
  Administered 2015-05-03 (×2): via INTRAVENOUS

## 2015-05-03 MED ORDER — PHENYLEPHRINE 40 MCG/ML (10ML) SYRINGE FOR IV PUSH (FOR BLOOD PRESSURE SUPPORT)
PREFILLED_SYRINGE | INTRAVENOUS | Status: AC
  Filled 2015-05-03: qty 10

## 2015-05-03 MED ORDER — METOCLOPRAMIDE HCL 5 MG/ML IJ SOLN: 5.0000 mg | Freq: Three times a day (TID) | INTRAMUSCULAR | Status: DC | PRN

## 2015-05-03 MED ORDER — BISACODYL 5 MG PO TBEC: 5.0000 mg | DELAYED_RELEASE_TABLET | Freq: Every day | ORAL | Status: DC | PRN

## 2015-05-03 MED ORDER — HYDROCODONE-ACETAMINOPHEN 10-325 MG PO TABS
1.0000 | ORAL_TABLET | ORAL | Status: DC | PRN
  Administered 2015-05-03: 1 via ORAL
  Administered 2015-05-03 – 2015-05-04 (×2): 2 via ORAL
  Filled 2015-05-03: qty 2
  Filled 2015-05-03: qty 1
  Filled 2015-05-03: qty 2

## 2015-05-03 MED ORDER — ENOXAPARIN SODIUM 30 MG/0.3ML ~~LOC~~ SOLN
30.0000 mg | Freq: Two times a day (BID) | SUBCUTANEOUS | Status: DC
  Administered 2015-05-04: 30 mg via SUBCUTANEOUS
  Filled 2015-05-03: qty 0.3

## 2015-05-03 MED ORDER — VENLAFAXINE HCL ER 75 MG PO CP24
75.0000 mg | ORAL_CAPSULE | Freq: Every day | ORAL | Status: DC
  Administered 2015-05-04: 75 mg via ORAL
  Filled 2015-05-03: qty 1

## 2015-05-03 MED ORDER — ONDANSETRON HCL 4 MG/2ML IJ SOLN
INTRAMUSCULAR | Status: DC | PRN
  Administered 2015-05-03: 4 mg via INTRAVENOUS

## 2015-05-03 MED ORDER — ALUM & MAG HYDROXIDE-SIMETH 200-200-20 MG/5ML PO SUSP: 30.0000 mL | ORAL | Status: DC | PRN

## 2015-05-03 MED ORDER — METOCLOPRAMIDE HCL 5 MG PO TABS: 5.0000 mg | ORAL_TABLET | Freq: Three times a day (TID) | ORAL | Status: DC | PRN

## 2015-05-03 MED ORDER — MIDAZOLAM HCL 2 MG/2ML IJ SOLN
INTRAMUSCULAR | Status: AC
  Filled 2015-05-03: qty 2

## 2015-05-03 MED ORDER — CELECOXIB 200 MG PO CAPS
200.0000 mg | ORAL_CAPSULE | Freq: Two times a day (BID) | ORAL | Status: DC
  Administered 2015-05-03 (×2): 200 mg via ORAL
  Filled 2015-05-03 (×3): qty 1

## 2015-05-03 MED ORDER — MENTHOL 3 MG MT LOZG: 1.0000 | LOZENGE | OROMUCOSAL | Status: DC | PRN

## 2015-05-03 MED ORDER — SODIUM CHLORIDE 0.9 % IV SOLN
INTRAVENOUS | Status: DC
  Administered 2015-05-03: 17:00:00 via INTRAVENOUS
  Administered 2015-05-04: 75 mL/h via INTRAVENOUS

## 2015-05-03 MED ORDER — FENTANYL CITRATE (PF) 250 MCG/5ML IJ SOLN
INTRAMUSCULAR | Status: DC | PRN
  Administered 2015-05-03: 50 ug via INTRAVENOUS
  Administered 2015-05-03: 25 ug via INTRAVENOUS
  Administered 2015-05-03: 50 ug via INTRAVENOUS
  Administered 2015-05-03 (×2): 25 ug via INTRAVENOUS

## 2015-05-03 MED ORDER — BUPIVACAINE-EPINEPHRINE (PF) 0.25% -1:200000 IJ SOLN
INTRAMUSCULAR | Status: DC | PRN
  Administered 2015-05-03: 20 mL via PERINEURAL

## 2015-05-03 MED ORDER — SENNOSIDES-DOCUSATE SODIUM 8.6-50 MG PO TABS: 1.0000 | ORAL_TABLET | Freq: Every evening | ORAL | Status: DC | PRN

## 2015-05-03 MED ORDER — EPHEDRINE SULFATE 50 MG/ML IJ SOLN
INTRAMUSCULAR | Status: DC | PRN
  Administered 2015-05-03: 10 mg via INTRAVENOUS

## 2015-05-03 MED ORDER — TRANEXAMIC ACID 1000 MG/10ML IV SOLN
1000.0000 mg | Freq: Once | INTRAVENOUS | Status: DC
  Filled 2015-05-03: qty 10

## 2015-05-03 MED ORDER — ACETAMINOPHEN 325 MG PO TABS
650.0000 mg | ORAL_TABLET | Freq: Four times a day (QID) | ORAL | Status: DC | PRN
  Administered 2015-05-04: 650 mg via ORAL
  Filled 2015-05-03: qty 2

## 2015-05-03 MED ORDER — MIDAZOLAM HCL 2 MG/2ML IJ SOLN
INTRAMUSCULAR | Status: DC | PRN
  Administered 2015-05-03 (×2): 1 mg via INTRAVENOUS
  Administered 2015-05-03: 2 mg via INTRAVENOUS

## 2015-05-03 MED ORDER — ZOLPIDEM TARTRATE 5 MG PO TABS: 5.0000 mg | ORAL_TABLET | Freq: Every evening | ORAL | Status: DC | PRN

## 2015-05-03 MED ORDER — STERILE WATER FOR INJECTION IJ SOLN
INTRAMUSCULAR | Status: AC
  Filled 2015-05-03: qty 10

## 2015-05-03 MED ORDER — DIPHENHYDRAMINE HCL 12.5 MG/5ML PO ELIX: 12.5000 mg | ORAL_SOLUTION | ORAL | Status: DC | PRN

## 2015-05-03 MED ORDER — SODIUM CHLORIDE 0.9 % IR SOLN
Status: DC | PRN
  Administered 2015-05-03: 1000 mL

## 2015-05-03 MED ORDER — FLEET ENEMA 7-19 GM/118ML RE ENEM: 1.0000 | ENEMA | Freq: Once | RECTAL | Status: DC | PRN

## 2015-05-03 MED ORDER — METHOCARBAMOL 500 MG PO TABS
500.0000 mg | ORAL_TABLET | Freq: Four times a day (QID) | ORAL | Status: DC | PRN
  Administered 2015-05-03 – 2015-05-04 (×3): 500 mg via ORAL
  Filled 2015-05-03 (×3): qty 1

## 2015-05-03 MED ORDER — ONDANSETRON HCL 4 MG/2ML IJ SOLN
4.0000 mg | Freq: Four times a day (QID) | INTRAMUSCULAR | Status: DC | PRN
  Administered 2015-05-03 – 2015-05-04 (×2): 4 mg via INTRAVENOUS
  Filled 2015-05-03 (×2): qty 2

## 2015-05-03 MED ORDER — PHENYLEPHRINE HCL 10 MG/ML IJ SOLN
INTRAMUSCULAR | Status: DC | PRN
  Administered 2015-05-03: 80 ug via INTRAVENOUS
  Administered 2015-05-03 (×2): 40 ug via INTRAVENOUS
  Administered 2015-05-03: 80 ug via INTRAVENOUS
  Administered 2015-05-03 (×3): 40 ug via INTRAVENOUS

## 2015-05-03 MED ORDER — FENTANYL CITRATE (PF) 250 MCG/5ML IJ SOLN
INTRAMUSCULAR | Status: AC
  Filled 2015-05-03: qty 5

## 2015-05-03 MED ORDER — PROGESTERONE MICRONIZED 100 MG PO CAPS
100.0000 mg | ORAL_CAPSULE | Freq: Every day | ORAL | Status: DC
  Filled 2015-05-03 (×2): qty 1

## 2015-05-03 MED ORDER — METHOCARBAMOL 1000 MG/10ML IJ SOLN
500.0000 mg | Freq: Four times a day (QID) | INTRAVENOUS | Status: DC | PRN
  Filled 2015-05-03: qty 5

## 2015-05-03 MED ORDER — ACETAMINOPHEN 650 MG RE SUPP: 650.0000 mg | Freq: Four times a day (QID) | RECTAL | Status: DC | PRN

## 2015-05-03 MED ORDER — ONDANSETRON HCL 4 MG PO TABS: 4.0000 mg | ORAL_TABLET | Freq: Four times a day (QID) | ORAL | Status: DC | PRN

## 2015-05-03 MED ORDER — DOCUSATE SODIUM 100 MG PO CAPS
100.0000 mg | ORAL_CAPSULE | Freq: Two times a day (BID) | ORAL | Status: DC
Start: 2015-05-03 — End: 2015-05-04
  Administered 2015-05-03 – 2015-05-04 (×3): 100 mg via ORAL
  Filled 2015-05-03 (×3): qty 1

## 2015-05-03 MED ORDER — EPHEDRINE SULFATE 50 MG/ML IJ SOLN
INTRAMUSCULAR | Status: AC
  Filled 2015-05-03: qty 1

## 2015-05-03 MED ORDER — 0.9 % SODIUM CHLORIDE (POUR BTL) OPTIME
TOPICAL | Status: DC | PRN
  Administered 2015-05-03: 1000 mL

## 2015-05-03 MED ORDER — PHENOL 1.4 % MT LIQD: 1.0000 | OROMUCOSAL | Status: DC | PRN

## 2015-05-03 MED ORDER — LORATADINE 10 MG PO TABS
10.0000 mg | ORAL_TABLET | Freq: Every day | ORAL | Status: DC
  Administered 2015-05-03: 10 mg via ORAL
  Filled 2015-05-03 (×2): qty 1

## 2015-05-03 MED ORDER — FLUTICASONE PROPIONATE 50 MCG/ACT NA SUSP
1.0000 | Freq: Every day | NASAL | Status: DC | PRN
  Filled 2015-05-03: qty 16

## 2015-05-03 SURGICAL SUPPLY — 59 items
BANDAGE ESMARK 6X9 LF (GAUZE/BANDAGES/DRESSINGS) ×1 IMPLANT
BLADE SAGITTAL 13X1.27X60 (BLADE) ×2 IMPLANT
BLADE SAGITTAL 13X1.27X60MM (BLADE) ×1
BLADE SAW SGTL 83.5X18.5 (BLADE) ×3 IMPLANT
BLADE SURG 10 STRL SS (BLADE) ×3 IMPLANT
BNDG CMPR 9X6 STRL LF SNTH (GAUZE/BANDAGES/DRESSINGS) ×1
BNDG ESMARK 6X9 LF (GAUZE/BANDAGES/DRESSINGS) ×3
BOWL SMART MIX CTS (DISPOSABLE) ×3 IMPLANT
CAPT KNEE TOTAL 3 ×3 IMPLANT
CEMENT BONE SIMPLEX SPEEDSET (Cement) ×6 IMPLANT
COVER SURGICAL LIGHT HANDLE (MISCELLANEOUS) ×3 IMPLANT
CUFF TOURNIQUET SINGLE 34IN LL (TOURNIQUET CUFF) ×3 IMPLANT
DRAPE EXTREMITY T 121X128X90 (DRAPE) ×3 IMPLANT
DRAPE INCISE IOBAN 66X45 STRL (DRAPES) ×6 IMPLANT
DRAPE PROXIMA HALF (DRAPES) IMPLANT
DRAPE U-SHAPE 47X51 STRL (DRAPES) ×3 IMPLANT
DRSG ADAPTIC 3X8 NADH LF (GAUZE/BANDAGES/DRESSINGS) ×3 IMPLANT
DRSG PAD ABDOMINAL 8X10 ST (GAUZE/BANDAGES/DRESSINGS) ×3 IMPLANT
DURAPREP 26ML APPLICATOR (WOUND CARE) ×4 IMPLANT
ELECT REM PT RETURN 9FT ADLT (ELECTROSURGICAL) ×3
ELECTRODE REM PT RTRN 9FT ADLT (ELECTROSURGICAL) ×1 IMPLANT
GAUZE SPONGE 4X4 12PLY STRL (GAUZE/BANDAGES/DRESSINGS) ×3 IMPLANT
GLOVE BIOGEL M 7.0 STRL (GLOVE) IMPLANT
GLOVE BIOGEL PI IND STRL 7.5 (GLOVE) IMPLANT
GLOVE BIOGEL PI IND STRL 8.5 (GLOVE) ×5 IMPLANT
GLOVE BIOGEL PI INDICATOR 7.5 (GLOVE)
GLOVE BIOGEL PI INDICATOR 8.5 (GLOVE) ×4
GLOVE SURG ORTHO 8.0 STRL STRW (GLOVE) ×14 IMPLANT
GOWN STRL REUS W/ TWL LRG LVL3 (GOWN DISPOSABLE) ×1 IMPLANT
GOWN STRL REUS W/ TWL XL LVL3 (GOWN DISPOSABLE) ×2 IMPLANT
GOWN STRL REUS W/TWL 2XL LVL3 (GOWN DISPOSABLE) ×3 IMPLANT
GOWN STRL REUS W/TWL LRG LVL3 (GOWN DISPOSABLE) ×3
GOWN STRL REUS W/TWL XL LVL3 (GOWN DISPOSABLE) ×6
HANDPIECE INTERPULSE COAX TIP (DISPOSABLE) ×3
HOOD PEEL AWAY FACE SHEILD DIS (HOOD) ×9 IMPLANT
KIT BASIN OR (CUSTOM PROCEDURE TRAY) ×3 IMPLANT
KIT ROOM TURNOVER OR (KITS) ×3 IMPLANT
KNEE CAPITATED TOTAL 3 IMPLANT
MANIFOLD NEPTUNE II (INSTRUMENTS) ×3 IMPLANT
NEEDLE 22X1 1/2 (OR ONLY) (NEEDLE) ×6 IMPLANT
NS IRRIG 1000ML POUR BTL (IV SOLUTION) ×3 IMPLANT
PACK TOTAL JOINT (CUSTOM PROCEDURE TRAY) ×3 IMPLANT
PACK UNIVERSAL I (CUSTOM PROCEDURE TRAY) ×3 IMPLANT
PAD ARMBOARD 7.5X6 YLW CONV (MISCELLANEOUS) ×6 IMPLANT
PADDING CAST COTTON 6X4 STRL (CAST SUPPLIES) ×3 IMPLANT
SET HNDPC FAN SPRY TIP SCT (DISPOSABLE) ×1 IMPLANT
STAPLER VISISTAT 35W (STAPLE) ×3 IMPLANT
SUCTION FRAZIER HANDLE 10FR (MISCELLANEOUS) ×2
SUCTION TUBE FRAZIER 10FR DISP (MISCELLANEOUS) ×1 IMPLANT
SUT BONE WAX W31G (SUTURE) ×3 IMPLANT
SUT VIC AB 0 CTB1 27 (SUTURE) ×6 IMPLANT
SUT VIC AB 1 CT1 27 (SUTURE) ×6
SUT VIC AB 1 CT1 27XBRD ANBCTR (SUTURE) ×2 IMPLANT
SUT VIC AB 2-0 CT1 27 (SUTURE) ×6
SUT VIC AB 2-0 CT1 TAPERPNT 27 (SUTURE) ×2 IMPLANT
SYR 20CC LL (SYRINGE) ×6 IMPLANT
TOWEL OR 17X24 6PK STRL BLUE (TOWEL DISPOSABLE) ×3 IMPLANT
TOWEL OR 17X26 10 PK STRL BLUE (TOWEL DISPOSABLE) ×3 IMPLANT
WATER STERILE IRR 1000ML POUR (IV SOLUTION) ×6 IMPLANT

## 2015-05-03 NOTE — Progress Notes (Signed)
Orthopedic Tech Progress Note Patient Details:  Tamara Pope 05-17-1945 [id number] On cpm at [place] Patient ID: [name], female   DOB: [birth year], 70 y.o.   MRN: [id number]   Braulio Bosch 05/03/2015, 6:52 PM

## 2015-05-03 NOTE — Care Management (Signed)
Utilization review completed. Kaveri Perras, RN Case Manager [phone]. 

## 2015-05-03 NOTE — Anesthesia Procedure Notes (Signed)
Spinal Patient location during procedure: OR Start time: 05/03/2015 7:34 AM End time: 05/03/2015 7:42 AM Staffing Anesthesiologist: Duane Boston Performed by: anesthesiologist  Preanesthetic Checklist Completed: patient identified, surgical consent, pre-op evaluation, timeout performed, IV checked, risks and benefits discussed and monitors and equipment checked Spinal Block Patient position: sitting Prep: DuraPrep Patient monitoring: cardiac monitor, continuous pulse ox and blood pressure Approach: midline Location: L2-3 Injection technique: single-shot Needle Needle type: Pencan  Needle gauge: 24 G Needle length: 9 cm Additional Notes Functioning IV was confirmed and monitors were applied. Sterile prep and drape, including hand hygiene and sterile gloves were used. The patient was positioned and the spine was prepped. The skin was anesthetized with lidocaine.  Free flow of clear CSF was obtained prior to injecting local anesthetic into the CSF.  The spinal needle aspirated freely following injection.  The needle was carefully withdrawn.  The patient tolerated the procedure well.

## 2015-05-03 NOTE — H&P (Signed)
Tamara Pope MRN:  [id number] DOB/SEX:  15-Jul-1945/female  CHIEF COMPLAINT:  Painful right Knee  HISTORY: Patient is a 70 y.o. female presented with a history of pain in the right knee. Onset of symptoms was gradual starting several years ago with gradually worsening course since that time. Prior procedures on the knee include arthroscopy. Patient has been treated conservatively with over-the-counter NSAIDs and activity modification. Patient currently rates pain in the knee at 10 out of 10 with activity. There is pain at night.  PAST MEDICAL HISTORY: There are no active problems to display for this patient.  Past Medical History  Diagnosis Date  . PMB (postmenopausal bleeding)   . History of DVT (deep vein thrombosis)     JAN 2002-- S/P BREAST IMPLANTS  RIGHT UPPER ARM (AXILLARY/ SUBCLAVIN VEIN)  . Arthritis    Past Surgical History  Procedure Laterality Date  . Breast enhancement surgery Bilateral NOV 2001  . Bilateral breast lift and implant reduction  SEPT 2014  . Tonsillectomy  AS CHILD  . Appendectomy  AS  CHILD  . Knee arthroscopy w/ meniscectomy Right 2011  . Rotator cuff repair Right 2005  . Cataract extraction w/ intraocular lens  implant, bilateral    . Dilatation & currettage/hysteroscopy with resectocope N/A 04/18/2013    Procedure: DILATATION & CURETTAGE/HYSTEROSCOPY ;  Surgeon: [name], MD;  Location: [facility];  Service: Gynecology;  Laterality: N/A;     MEDICATIONS:   Prescriptions prior to admission  Medication Sig Dispense Refill Last Dose  . aspirin EC 81 MG tablet Take 81 mg by mouth daily.   2 weeks  . Biotin 5000 MCG TABS Take 1 tablet by mouth daily.   2 weeks  . Calcium Citrate-Vitamin D (CALCIUM + D PO) Take 1 tablet by mouth daily.   2 weeks  . cetirizine (ZYRTEC) 10 MG tablet Take 10 mg by mouth daily.   05/02/2015 at Unknown time  . Coenzyme Q10 (COQ10 PO) Take 1 capsule by mouth daily.   2 weeks  . Cyanocobalamin (B-12)  5000 MCG SUBL Place 1 tablet under the tongue daily.   2 weeks  . estradiol (VIVELLE-DOT) 0.05 MG/24HR patch Place 1 patch onto the skin 2 (two) times a week. MINIVELLE   Past Week at Unknown time  . fluticasone (FLONASE) 50 MCG/ACT nasal spray Place 1 spray into both nostrils daily as needed for allergies or rhinitis.   05/02/2015 at Unknown time  . magnesium gluconate (MAGONATE) 500 MG tablet Take 500 mg by mouth at bedtime.   2 weeks  . Omega 3 1000 MG CAPS Take 1 capsule by mouth daily.   2 weeks  . Probiotic Product (ALIGN) 4 MG CAPS Take 1 capsule by mouth daily.   05/02/2015 at Unknown time  . progesterone (PROMETRIUM) 100 MG capsule Take 100 mg by mouth daily.   05/02/2015 at Unknown time  . venlafaxine XR (EFFEXOR-XR) 75 MG 24 hr capsule Take 75 mg by mouth daily with breakfast.   05/02/2015 at Unknown time  . vitamin C (ASCORBIC ACID) 500 MG tablet Take 500 mg by mouth daily.   2 weeks    ALLERGIES:  No Known Allergies  REVIEW OF SYSTEMS:  Pertinent items noted in HPI and remainder of comprehensive ROS otherwise negative.   FAMILY HISTORY:  History reviewed. No pertinent family history.  SOCIAL HISTORY:   Social History  Substance Use Topics  . Smoking status: Never Smoker   . Smokeless tobacco: Never Used  .  Alcohol Use: Yes     Comment: maybe 2glasses a week     EXAMINATION:  Vital signs in last 24 hours: Temp:  [97.1 F (36.2 C)] 97.1 F (36.2 C) (01/30 [id number]) Pulse Rate:  [71] 71 (01/30 0607) Resp:  [20] 20 (01/30 0607) BP: (137)/(70) 137/70 mmHg (01/30 0607) SpO2:  [98 %] 98 % (01/30 0607) Weight:  [70.308 kg (155 lb)] 70.308 kg (155 lb) (01/30 0607)  General appearance: alert, cooperative and no distress Lungs: clear to auscultation bilaterally Heart: regular rate and rhythm, S1, S2 normal, no murmur, click, rub or gallop Abdomen: soft, non-tender; bowel sounds normal; no masses,  no organomegaly Extremities: extremities normal, atraumatic, no cyanosis or edema  and Homans sign is negative, no sign of DVT Pulses: 2+ and symmetric Skin: Skin color, texture, turgor normal. No rashes or lesions Neurologic: Alert and oriented X 3, normal strength and tone. Normal symmetric reflexes. Normal coordination and gait  Musculoskeletal:  ROM 0-90, Ligaments intact,  Imaging Review Plain radiographs demonstrate severe degenerative joint disease of the right knee. The overall alignment is significant varus. The bone quality appears to be good for age and reported activity level.  Assessment/Plan: Primary osteoarthritis, right knee   The patient history, physical examination and imaging studies are consistent with advanced degenerative joint disease of the right knee. The patient has failed conservative treatment.  The clearance notes were reviewed.  After discussion with the patient it was felt that Total Knee Replacement was indicated. The procedure,  risks, and benefits of total knee arthroplasty were presented and reviewed. The risks including but not limited to aseptic loosening, infection, blood clots, vascular injury, stiffness, patella tracking problems complications among others were discussed. The patient acknowledged the explanation, agreed to proceed with the plan.  [name] 05/03/2015, 6:28 AM

## 2015-05-03 NOTE — Care Management Note (Signed)
Case Management Note  Patient Details  Name: Tamara Pope MRN: [id number] Date of Birth: 05-29-1945  Subjective/Objective:  70 yr old female s/p right total knee arthroplasty.                  Action/Plan:  Case manager spoke with patient and husband concerning home health and DME needs. Patient was preoperatively setup with  [facility], no changes. Rolling walker, 3in1 and CPM have been delivered to the home. Patient has family support at discharge.  Expected Discharge Date:    05/04/15              Expected Discharge Plan:  [place]  In-House Referral:     Discharge planning Services  CM Consult  Post Acute Care Choice:  Durable Medical Equipment, Home Health Choice offered to:  Patient  DME Arranged:  3-N-1, Walker rolling, CPM DME Agency:  [facility]  HH Arranged:  PT HH Agency:  [place]  Status of Service:  Completed, signed off  Medicare Important Message Given:    Date Medicare IM Given:    Medicare IM give by:    Date Additional Medicare IM Given:    Additional Medicare Important Message give by:     If discussed at [place] of Stay Meetings, dates discussed:    Additional Comments:  Ninfa Meeker, RN 05/03/2015, 2:24 PM

## 2015-05-03 NOTE — Evaluation (Signed)
Physical Therapy Evaluation Patient Details Name: Tamara Pope MRN: [id number] DOB: [birth year] Today's Date: 05/03/2015   History of Present Illness  Pt admitted 1/30 for elective R TKA. PMH: history of DVT, arthritis, appendectomy, rotator cuff repair, and breast enhancement surgery.  Clinical Impression  Pt is s/p TKA resulting in the deficits listed below (see PT Problem List). Pt tolerated OOB mobility well but then felt hot and lightheaded therefore deferred ambulation at this time. Suspect pt will progress well and be able to d/c home as planned tomorrow s/p 2, PT treatments. Pt will benefit from skilled PT to increase their independence and safety with mobility to allow discharge to the venue listed below.      Follow Up Recommendations Home health PT;Supervision/Assistance - 24 hour    Equipment Recommendations  None recommended by PT    Recommendations for Other Services       Precautions / Restrictions Precautions Precautions: Knee Precaution Booklet Issued: Yes (comment) Precaution Comments: instructed on not placing pillow under knee Restrictions Weight Bearing Restrictions: Yes RLE Weight Bearing: Weight bearing as tolerated      Mobility  Bed Mobility Overal bed mobility: Needs Assistance Bed Mobility: Supine to Sit     Supine to sit: Supervision     General bed mobility comments: used bed rail but no physical assist needed  Transfers Overall transfer level: Needs assistance Equipment used: Rolling walker (2 wheeled) Transfers: Sit to/from [facility] Sit to Stand: Min guard Stand pivot transfers: Min assist       General transfer comment: v/c's for hand placement, but no physical assist. pt able to complete std pvt to [facility] and then to chair without knee buckling  Ambulation/Gait             General Gait Details: deferred ambulation due to patient feeling hot and light headed  Science writer     Modified Rankin (Stroke Patients Only)       Balance Overall balance assessment: Needs assistance Sitting-balance support: Feet supported;No upper extremity supported Sitting balance-Leahy Scale: Good     Standing balance support: Single extremity supported Standing balance-Leahy Scale: Fair Standing balance comment: pt able to stand and perform pericare without physical assist                             Pertinent Vitals/Pain Pain Assessment: 0-10 Pain Score: 7  Pain Location: R knee Pain Intervention(s): Premedicated before session    [place] expects to be discharged to:: Private residence Living Arrangements: Spouse/significant other Available Help at Discharge: Family;Available 24 hours/day Type of Home: House Home Access: Stairs to enter   [employer] of Steps: 1 (platform step) Home Layout: One level Home Equipment: Walker - 2 wheels;Cane - single point;Shower seat;Bedside commode Additional Comments: equip delivered PTA    Prior Function Level of Independence: Independent         Comments: owns a business [place]     [employer]   Dominant Hand: Right    Extremity/Trunk Assessment   Upper Extremity Assessment: Overall WFL for tasks assessed           Lower Extremity Assessment: RLE deficits/detail RLE Deficits / Details: weak quad set, achieved 60 degrees active knee flexion    Cervical / Trunk Assessment: Normal  Communication   Communication: No difficulties  Cognition Arousal/Alertness: Awake/alert Behavior During Therapy: [facility] for  tasks assessed/performed Overall Cognitive Status: Within Functional Limits for tasks assessed                      General Comments      Exercises Total Joint Exercises Ankle Circles/Pumps: AROM;Both;10 reps;Supine Quad Sets: AROM;Right;10 reps;Supine (with 5 second hold) Heel Slides: AROM;Right;10 reps;Supine      Assessment/Plan    PT  Assessment Patient needs continued PT services  PT Diagnosis Difficulty walking;Acute pain   PT Problem List Decreased strength;Decreased range of motion;Decreased activity tolerance;Decreased balance;Decreased mobility;Decreased safety awareness  PT Treatment Interventions DME instruction;Gait training;Stair training;Functional mobility training;Therapeutic activities;Therapeutic exercise;Patient/family education   PT Goals (Current goals can be found in the Care Plan section) Acute Rehab PT Goals Patient Stated Goal: home PT Goal Formulation: With patient Time For Goal Achievement: 05/10/15 Potential to Achieve Goals: Good    Frequency 7X/week   Barriers to discharge        Co-evaluation               End of Session Equipment Utilized During Treatment: Gait belt Activity Tolerance: Patient tolerated treatment well Patient left: in chair;with call bell/phone within reach;with family/visitor present Nurse Communication: Mobility status         Time: [id number] PT Time Calculation (min) (ACUTE ONLY): 28 min   Charges:   PT Evaluation $PT Eval Moderate Complexity: 1 Procedure PT Treatments $Therapeutic Activity: 8-22 mins   PT G CodesKingsley Callander 05/03/2015, 2:56 PM   Kittie Plater, PT, DPT Pager #: [phone] Office #: [phone]

## 2015-05-03 NOTE — Transfer of Care (Signed)
Immediate Anesthesia Transfer of Care Note  Patient: Tamara Pope  Procedure(s) Performed: Procedure(s): RIGHT TOTAL KNEE ARTHROPLASTY (Right)  Patient Location: PACU  Anesthesia Type:Spinal and GA combined with regional for post-op pain  Level of Consciousness: awake, alert  and oriented  Airway & Oxygen Therapy: Patient Spontanous Breathing  Post-op Assessment: Report given to RN and Post -op Vital signs reviewed and stable  Post vital signs: Reviewed and stable  Last Vitals:  Filed Vitals:   05/03/15 0607 05/03/15 0619  BP: 137/70   Pulse: 71   Temp:  36.2 C  Resp: 20     Complications: No apparent anesthesia complications

## 2015-05-03 NOTE — Progress Notes (Signed)
Occupational Therapy Evaluation Patient Details Name: Tamara Pope MRN: [id number] DOB: [birth year] Today's Date: 05/03/2015    History of Present Illness Pt admitted 1/30 for elective R TKA. PMH: history of DVT, arthritis, appendectomy, rotator cuff repair, and breast enhancement surgery.   Clinical Impression   PTA, pt was independent with all ADLs and mobility. Pt currently requires min guard assist for functional transfers and min assist for LB ADLs. Pt plans to d/c home with 24/7 assistance from her husband. Pt would benefit from continued acute OT to increase independence and safety with ADLs and mobility to allow for safe discharge home. No OT follow up or DME recommendations at this time.    Follow Up Recommendations  No OT follow up;Supervision - Intermittent    Equipment Recommendations  None recommended by OT    Recommendations for Other Services       Precautions / Restrictions Precautions Precautions: Knee Precaution Booklet Issued: No Precaution Comments: Reviewed not placing pillow under knee Restrictions Weight Bearing Restrictions: Yes RLE Weight Bearing: Weight bearing as tolerated      Mobility Bed Mobility Overal bed mobility: Needs Assistance Bed Mobility: Supine to Sit;Sit to Supine     Supine to sit: Supervision Sit to supine: Supervision   General bed mobility comments: HOB flat, no use of bedrails to simulate home environment. Able to progress RLE off bed without physical assist or cues.  Transfers Overall transfer level: Needs assistance Equipment used: Rolling walker (2 wheeled) Transfers: Sit to/from Stand Sit to Stand: Min guard Stand pivot transfers: Min assist       General transfer comment: Min guard for safety. Good demonstration of safe hand placement on surfaces and DME. No overt LOB or knee buckling noted.    Balance Overall balance assessment: Needs assistance Sitting-balance support: No upper extremity supported;Feet  supported Sitting balance-Leahy Scale: Good     Standing balance support: No upper extremity supported;During functional activity Standing balance-Leahy Scale: Fair Standing balance comment: Able to complete grooming tasks at sink without UE support for 2-3 minutes.                            ADL Overall ADL's : Needs assistance/impaired     Grooming: Wash/dry hands;Min guard;Standing           Upper Body Dressing : Set up;Sitting   Lower Body Dressing: Minimal assistance;Sit to/from stand   Toilet Transfer: Min guard;Cueing for safety;Ambulation;BSC;RW Toilet Transfer Details (indicate cue type and reason): BSC over toilet, cues to feel BSC on back of legs before sitting Toileting- Clothing Manipulation and Hygiene: Min guard;Sit to/from stand;Sitting/lateral lean       Functional mobility during ADLs: Min guard;Rolling walker General ADL Comments: Husband present for OT eval. Pt very motivated and responded well to allow compensatory strategy suggestions and precautions.     Vision Vision Assessment?: No apparent visual deficits   Perception     Praxis      Pertinent Vitals/Pain Pain Assessment: 0-10 Pain Score: 8  Pain Location: R knee Pain Descriptors / Indicators: Aching Pain Intervention(s): Limited activity within patient's tolerance;Monitored during session;Patient requesting pain meds-RN notified;Ice applied;Repositioned     Hand Dominance Right   Extremity/Trunk Assessment Upper Extremity Assessment Upper Extremity Assessment: Overall WFL for tasks assessed   Lower Extremity Assessment Lower Extremity Assessment: RLE deficits/detail RLE Deficits / Details: decreased ROM and strength as expected post op   Cervical / Trunk Assessment Cervical / Trunk  Assessment: Normal   Communication Communication Communication: No difficulties   Cognition Arousal/Alertness: Awake/alert Behavior During Therapy: WFL for tasks  assessed/performed Overall Cognitive Status: Within Functional Limits for tasks assessed                     General Comments       Exercises Exercises: Total Joint     Shoulder Instructions      Home Living Family/patient expects to be discharged to:: Private residence Living Arrangements: Spouse/significant other Available Help at Discharge: Family;Available 24 hours/day Type of Home: House Home Access: Stairs to enter [employer] of Steps: 1   Home Layout: One level     Bathroom Shower/Tub: Walk-in shower;Door   [employer] Toilet: Standard Bathroom Accessibility: Yes How Accessible: Accessible via walker Home Equipment: Walker - 2 wheels;Cane - single point;Bedside commode;Shower seat   Additional Comments: equip delivered PTA      Prior Functioning/Environment Level of Independence: Independent        Comments: [place] business [place]    OT Diagnosis: Acute pain   OT Problem List: Decreased strength;Decreased range of motion;Decreased activity tolerance;Impaired balance (sitting and/or standing);Decreased knowledge of use of DME or AE;Decreased knowledge of precautions;Decreased safety awareness;Decreased coordination;Pain   OT Treatment/Interventions: Self-care/ADL training;Therapeutic exercise;Energy conservation;DME and/or AE instruction;Therapeutic activities;Patient/family education;Balance training    OT Goals(Current goals can be found in the care plan section) Acute Rehab OT Goals Patient Stated Goal: to go home OT Goal Formulation: With patient Time For Goal Achievement: 05/17/15 Potential to Achieve Goals: Good ADL Goals Pt Will Perform Grooming: with modified independence;standing Pt Will Perform Lower Body Bathing: with min assist;with caregiver independent in assisting;sit to/from stand Pt Will Perform Lower Body Dressing: with min assist;with caregiver independent in assisting;sit to/from stand Pt Will Transfer to Toilet:  with modified independence;ambulating;bedside commode (BSC over toilet) Pt Will Perform Toileting - Clothing Manipulation and hygiene: with modified independence;sit to/from stand Pt Will Perform Tub/Shower Transfer: Shower transfer;with supervision;ambulating;shower seat;rolling walker  OT Frequency: Min 2X/week   Barriers to D/C:            Co-evaluation              End of Session Equipment Utilized During Treatment: Gait belt;Rolling walker CPM Right Knee CPM Right Knee: Off Nurse Communication: Mobility status;Precautions  Activity Tolerance: Patient limited by pain Patient left: in bed;with call bell/phone within reach;with family/visitor present;with SCD's reapplied   Time: [id number] OT Time Calculation (min): 25 min Charges:  OT General Charges $OT Visit: 1 Procedure OT Evaluation $OT Eval Moderate Complexity: 1 Procedure OT Treatments $Self Care/Home Management : 8-22 mins G-Codes:    Redmond Baseman, OTR/L Pager: [phone] 05/03/2015, 3:55 PM

## 2015-05-03 NOTE — Care Management Note (Deleted)
Case Management Note  Patient Details  Name: Tamara Pope MRN: [id number] Date of Birth: [birth year]  Subjective/Objective:   70 yr old female s/p right total knee arthroplasty.                 Action/Plan:  Patient was preoperatively setup with [facility]   Expected Discharge Date:                  Expected Discharge Plan:  [place]  In-House Referral:     Discharge planning Services  CM Consult  Post Acute Care Choice:    Choice offered to:     DME Arranged:    DME Agency:     HH Arranged:  PT HH Agency:  [place]  Status of Service:  In process, will continue to follow  Medicare Important Message Given:    Date Medicare IM Given:    Medicare IM give by:    Date Additional Medicare IM Given:    Additional Medicare Important Message give by:     If discussed at [place] of Stay Meetings, dates discussed:    Additional Comments:  Ninfa Meeker, RN 05/03/2015, 2:03 PM

## 2015-05-03 NOTE — Anesthesia Postprocedure Evaluation (Signed)
Anesthesia Post Note  Patient: Tamara Pope  Procedure(s) Performed: Procedure(s) (LRB): RIGHT TOTAL KNEE ARTHROPLASTY (Right)  Patient location during evaluation: PACU Anesthesia Type: Spinal Level of consciousness: oriented and awake and alert Pain management: pain level controlled Vital Signs Assessment: post-procedure vital signs reviewed and stable Respiratory status: spontaneous breathing, respiratory function stable and patient connected to nasal cannula oxygen Cardiovascular status: blood pressure returned to baseline and stable Postop Assessment: no headache and no backache Anesthetic complications: no    Last Vitals:  Filed Vitals:   05/03/15 1030 05/03/15 1045  BP: 130/73 157/75  Pulse: 76 70  Temp:  36.1 C  Resp: 19 19    Last Pain: There were no vitals filed for this visit.          L Sensory Level: S1-Sole of foot, small toes (05/03/15 1045) R Sensory Level: S1-Sole of foot, small toes (05/03/15 1045)  [place]

## 2015-05-04 ENCOUNTER — Encounter (HOSPITAL_COMMUNITY): Payer: Self-pay | Admitting: Orthopedic Surgery

## 2015-05-04 LAB — CBC
HEMATOCRIT: 39.2 % (ref 36.0–46.0)
HEMOGLOBIN: 13.2 g/dL (ref 12.0–15.0)
MCH: 30.6 pg (ref 26.0–34.0)
MCHC: 33.7 g/dL (ref 30.0–36.0)
MCV: 91 fL (ref 78.0–100.0)
Platelets: 277 10*3/uL (ref 150–400)
RBC: 4.31 MIL/uL (ref 3.87–5.11)
RDW: 13 % (ref 11.5–15.5)
WBC: 14.1 10*3/uL — ABNORMAL HIGH (ref 4.0–10.5)

## 2015-05-04 LAB — BASIC METABOLIC PANEL
Anion gap: 10 (ref 5–15)
CHLORIDE: 96 mmol/L — AB (ref 101–111)
CO2: 25 mmol/L (ref 22–32)
CREATININE: 0.54 mg/dL (ref 0.44–1.00)
Calcium: 8.8 mg/dL — ABNORMAL LOW (ref 8.9–10.3)
GFR calc Af Amer: >60 mL/min (ref >60)
GFR calc non Af Amer: >60 mL/min (ref >60)
Glucose, Bld: 159 mg/dL — ABNORMAL HIGH (ref 65–99)
POTASSIUM: 3.9 mmol/L (ref 3.5–5.1)
Sodium: 131 mmol/L — ABNORMAL LOW (ref 135–145)

## 2015-05-04 MED ORDER — ENOXAPARIN SODIUM 40 MG/0.4ML ~~LOC~~ SOLN: 40.0000 mg | SUBCUTANEOUS | Status: DC

## 2015-05-04 MED ORDER — METHOCARBAMOL 500 MG PO TABS: 500.0000 mg | ORAL_TABLET | Freq: Four times a day (QID) | ORAL | Status: DC | PRN

## 2015-05-04 MED ORDER — HYDROCODONE-ACETAMINOPHEN 10-325 MG PO TABS: 1.0000 | ORAL_TABLET | ORAL | Status: DC | PRN

## 2015-05-04 MED ORDER — HYDROMORPHONE HCL 2 MG PO TABS
2.0000 mg | ORAL_TABLET | ORAL | Status: DC | PRN
  Administered 2015-05-04 (×2): 2 mg via ORAL
  Filled 2015-05-04 (×2): qty 1

## 2015-05-04 MED ORDER — HYDROMORPHONE HCL 2 MG PO TABS: 2.0000 mg | ORAL_TABLET | ORAL | Status: DC | PRN

## 2015-05-04 MED ORDER — DOCUSATE SODIUM 100 MG PO CAPS
100.0000 mg | ORAL_CAPSULE | Freq: Two times a day (BID) | ORAL | Status: DC
Start: 2015-05-04 — End: 2017-02-05

## 2015-05-04 MED ORDER — ONDANSETRON HCL 4 MG PO TABS: 4.0000 mg | ORAL_TABLET | Freq: Four times a day (QID) | ORAL | Status: DC | PRN

## 2015-05-04 MED ORDER — OXYCODONE HCL 5 MG PO TABS: 5.0000 mg | ORAL_TABLET | ORAL | Status: DC | PRN

## 2015-05-04 NOTE — Progress Notes (Addendum)
Physical Therapy Treatment Patient Details Name: Tamara Pope MRN: [id number] DOB: 1945/07/24 Today's Date: 05/04/2015    History of Present Illness Pt admitted 1/30 for elective R TKA. PMH: history of DVT, arthritis, appendectomy, rotator cuff repair, and breast enhancement surgery.    PT Comments    Patient is making good progress with PT.  From a mobility standpoint anticipate patient will be ready for DC home when medically ready .      Follow Up Recommendations  Home health PT;Supervision/Assistance - 24 hour     Equipment Recommendations  None recommended by PT    Recommendations for Other Services       Precautions / Restrictions Precautions Precautions: Knee Precaution Booklet Issued: No Precaution Comments: Reviewed not placing pillow under knee Restrictions Weight Bearing Restrictions: Yes RLE Weight Bearing: Weight bearing as tolerated    Mobility  Bed Mobility Overal bed mobility: Needs Assistance Bed Mobility: Supine to Sit     Supine to sit: Supervision Sit to supine: Supervision   General bed mobility comments: supervision for safety; no use of bed rails needed; HOB flat; increased time needed due to pain with R knee flexion   Transfers Overall transfer level: Needs assistance Equipment used: Rolling walker (2 wheeled) Transfers: Sit to/from Stand Sit to Stand: Supervision Stand pivot transfers: Supervision       General transfer comment: verbal cues for safe hand placement; good technique; supervision for safety  Ambulation/Gait Ambulation/Gait assistance: Min guard Ambulation Distance (Feet): 150 Feet Assistive device: Rolling walker (2 wheeled) Gait Pattern/deviations: Trunk flexed;Step-to pattern;Step-through pattern;Decreased step length - left;Decreased stance time - right;Antalgic     Gait velocity interpretation: Below normal speed for age/gender General Gait Details: vc for sequencing, posture, and facilitation of R heel strike  and increasing R knee extension befor WS to R LE   Stairs  Min guard No rails; forward; with walker Number: 2 (1X2) Educated pt on technique with use of RW; husband present for stair training            Wheelchair Mobility    Modified Rankin (Stroke Patients Only)       Balance Overall balance assessment: Needs assistance Sitting-balance support: No upper extremity supported;Feet supported Sitting balance-Leahy Scale: Good     Standing balance support: Bilateral upper extremity supported Standing balance-Leahy Scale: Fair Standing balance comment: Able to maintain balance without UE support during dressing tasks                    Cognition Arousal/Alertness: Awake/alert Behavior During Therapy: WFL for tasks assessed/performed Overall Cognitive Status: Within Functional Limits for tasks assessed                      Exercises Total Joint Exercises Quad Sets: AROM;Both;10 reps;Supine Short Arc Quad: AROM;Right;10 reps;Supine Heel Slides: AROM;AAROM;Right;10 reps;Supine Goniometric ROM: 10-55    General Comments General comments (skin integrity, edema, etc.): pt with c/o nausea that subided after ambulating      Pertinent Vitals/Pain Pain Assessment: 0-10 Pain Score: 6  Pain Location: R knee Pain Descriptors / Indicators: Sore Pain Intervention(s): Limited activity within patient's tolerance;Monitored during session;Premedicated before session;Repositioned    Home Living                      Prior Function            PT Goals (current goals can now be found in the care plan section) Acute Rehab PT Goals  Patient Stated Goal: to go home Progress towards PT goals: Progressing toward goals    Frequency  7X/week    PT Plan Current plan remains appropriate    Co-evaluation             End of Session Equipment Utilized During Treatment: Gait belt Activity Tolerance: Patient tolerated treatment well Patient left: in  chair;with call bell/phone within reach;with family/visitor present     Time: [id number] PT Time Calculation (min) (ACUTE ONLY): 47 min  Charges:  $Gait Training: 23-37 mins $Therapeutic Exercise: 8-22 mins                    G Codes:      [place] April, PTA Pager: [phone]   05/04/2015, 12:17 PM

## 2015-05-04 NOTE — Op Note (Signed)
TOTAL KNEE REPLACEMENT OPERATIVE NOTE:  05/03/2015  12:51 PM  PATIENT:  Tamara Pope  70 y.o. female  PRE-OPERATIVE DIAGNOSIS:  primary osteoarthritis right knee  POST-OPERATIVE DIAGNOSIS:  primary osteoarthritis right knee  PROCEDURE:  Procedure(s): RIGHT TOTAL KNEE ARTHROPLASTY  SURGEON:  Surgeon(s): Vickey Huger, MD  PHYSICIAN ASSISTANT: Carlynn Spry, [facility]  ANESTHESIA:   spinal  DRAINS: Hemovac  SPECIMEN: None  COUNTS:  Correct  TOURNIQUET:   Total Tourniquet Time Documented: Thigh (Right) - 42 minutes Total: Thigh (Right) - 42 minutes   DICTATION:  Indication for procedure:    The patient is a 70 y.o. female who has failed conservative treatment for primary osteoarthritis right knee.  Informed consent was obtained prior to anesthesia. The risks versus benefits of the operation were explain and in a way the patient can, and did, understand.   On the implant demand matching protocol, this patient scored 10.  Therefore, this patient did" "did not receive a polyethylene insert with vitamin E which is a high demand implant.  Description of procedure:     The patient was taken to the operating room and placed under anesthesia.  The patient was positioned in the usual fashion taking care that all body parts were adequately padded and/or protected.  I foley catheter was not placed.  A tourniquet was applied and the leg prepped and draped in the usual sterile fashion.  The extremity was exsanguinated with the esmarch and tourniquet inflated to 350 mmHg.  Pre-operative range of motion was normal.  The knee was in 5 degree of mild valgus.  A midline incision approximately 6-7 inches long was made with a #10 blade.  A new blade was used to make a parapatellar arthrotomy going 2-3 cm into the quadriceps tendon, over the patella, and alongside the medial aspect of the patellar tendon.  A synovectomy was then performed with the #10 blade and forceps. I then elevated the deep MCL  off the medial tibial metaphysis subperiosteally around to the semimembranosus attachment.    I everted the patella and used calipers to measure patellar thickness.  I used the reamer to ream down to appropriate thickness to recreate the native thickness.  I then removed excess bone with the rongeur and sagittal saw.  I used the appropriately sized template and drilled the three lug holes.  I then put the trial in place and measured the thickness with the calipers to ensure recreation of the native thickness.  The trial was then removed and the patella subluxed and the knee brought into flexion.  A homan retractor was place to retract and protect the patella and lateral structures.  A Z-retractor was place medially to protect the medial structures.  The extra-medullary alignment system was used to make cut the tibial articular surface perpendicular to the anamotic axis of the tibia and in 3 degrees of posterior slope.  The cut surface and alignment jig was removed.  I then used the intramedullary alignment guide to make a 4 valgus cut on the distal femur.  I then marked out the epicondylar axis on the distal femur.  The posterior condylar axis measured 5 degrees.  I then used the anterior referencing sizer and measured the femur to be a size 6.  The 4-In-1 cutting block was screwed into place in external rotation matching the posterior condylar angle, making our cuts perpendicular to the epicondylar axis.  Anterior, posterior and chamfer cuts were made with the sagittal saw.  The cutting block and cut  pieces were removed.  A lamina spreader was placed in 90 degrees of flexion.  The ACL, PCL, menisci, and posterior condylar osteophytes were removed.  A 10 mm spacer blocked was found to offer good flexion and extension gap balance after moderate in degree releasing.   The scoop retractor was then placed and the femoral finishing block was pinned in place.  The small sagittal saw was used as well as the lug  drill to finish the femur.  The block and cut surfaces were removed and the medullary canal hole filled with autograft bone from the cut pieces.  The tibia was delivered forward in deep flexion and external rotation.  A size C tray was selected and pinned into place centered on the medial 1/3 of the tibial tubercle.  The reamer and keel was used to prepare the tibia through the tray.    I then trialed with the size 6 femur, size C tibia, a 10 mm insert and the 32 patella.  I had excellent flexion/extension gap balance, excellent patella tracking.  Flexion was full and beyond 120 degrees; extension was zero.  These components were chosen and the staff opened them to me on the back table while the knee was lavaged copiously and the cement mixed.  The soft tissue was infiltrated with 60cc of exparel 1.3% through a 21 gauge needle.  I cemented in the components and removed all excess cement.  The polyethylene tibial component was snapped into place and the knee placed in extension while cement was hardening.  The capsule was infilltrated with 30cc of .25% Marcaine with epinephrine.  A hemovac was place in the joint exiting superolaterally.  A pain pump was place superomedially superficial to the arthrotomy.  Once the cement was hard, the tourniquet was let down.  Hemostasis was obtained.  The arthrotomy was closed with figure-8 #1 vicryl sutures.  The deep soft tissues were closed with #0 vicryls and the subcuticular layer closed with a running #2-0 vicryl.  The skin was reapproximated and closed with skin staples.  The wound was dressed with xeroform, 4 x4's, 2 ABD sponges, a single layer of webril and a TED stocking.   The patient was then awakened, extubated, and taken to the recovery room in stable condition.  BLOOD LOSS:  300cc DRAINS: 1 hemovac, 1 pain catheter COMPLICATIONS:  None.  PLAN OF CARE: Admit to inpatient   PATIENT DISPOSITION:  PACU - hemodynamically stable.   Delay start of  Pharmacological VTE agent (>24hrs) due to surgical blood loss or risk of bleeding:  not applicable  Please fax a copy of this op note to my office at [phone] (please only include page 1 and 2 of the Case Information op note)

## 2015-05-04 NOTE — Discharge Instructions (Signed)

## 2015-05-04 NOTE — Progress Notes (Signed)
SPORTS MEDICINE AND JOINT REPLACEMENT  Lara Mulch, MD   Carlynn Spry, PA-C [address], [place], [place]  [id number]                             [phone]   PROGRESS NOTE  Subjective:  negative for Chest Pain  negative for Shortness of Breath  negative for Nausea/Vomiting   negative for Calf Pain  negative for Bowel Movement   Tolerating Diet: yes         Patient reports pain as 5 on 0-10 scale.    Objective: Vital signs in last 24 hours:   Patient Vitals for the past 24 hrs:  BP Temp Temp src Pulse Resp SpO2  05/04/15 0423 140/66 mmHg 98.2 F (36.8 C) Oral 83 18 96 %  05/04/15 0016 (!) 159/76 mmHg 98.4 F (36.9 C) Oral 80 18 96 %  05/03/15 2001 (!) 150/73 mmHg 98.1 F (36.7 C) Oral 73 18 94 %  05/03/15 1114 (!) 150/76 mmHg 97.6 F (36.4 C) - 71 16 99 %  05/03/15 1045 (!) 157/75 mmHg 97 F (36.1 C) - 70 19 98 %  05/03/15 1030 130/73 mmHg - - 76 19 95 %  05/03/15 1015 137/71 mmHg - - 75 16 95 %  05/03/15 1000 - - - 78 16 95 %  05/03/15 0956 135/64 mmHg - - 79 17 96 %  05/03/15 0941 128/61 mmHg - - 80 15 97 %  05/03/15 0930 - - - 82 15 97 %  05/03/15 0926 120/67 mmHg - - 87 15 94 %  05/03/15 0912 125/71 mmHg - - 88 18 96 %  05/03/15 0908 - 97.6 F (36.4 C) - 89 18 98 %    @flow {1959:LAST@   Intake/Output from previous day:   01/30 0701 - 01/31 0700 In: 1300 [I.V.:1300] Out: 25    Intake/Output this shift:       Intake/Output      01/30 0701 - 01/31 0700 01/31 0701 - 02/01 0700   I.V. (mL/kg) 1300 (18.5)    Total Intake(mL/kg) 1300 (18.5)    Blood 25    Total Output 25     Net +1275          Urine Occurrence 2 x       LABORATORY DATA:  Recent Labs  05/03/15 1257  WBC 13.9*  HGB 13.2  HCT 38.9  PLT 260    Recent Labs  05/03/15 1257  CREATININE 0.60   Lab Results  Component Value Date   INR 1.09 04/19/2015    Examination:  General appearance: alert, cooperative and no distress Extremities: extremities normal,  atraumatic, no cyanosis or edema and no ulcers, gangrene or trophic changes  Wound Exam: clean, dry, intact   Drainage:  None: wound tissue dry  Motor Exam: Quadriceps and Hamstrings Intact  Sensory Exam: Superficial Peroneal and Tibial normal   Assessment:    1 Day Post-Op  Procedure(s) (LRB): RIGHT TOTAL KNEE ARTHROPLASTY (Right)  ADDITIONAL DIAGNOSIS:  Active Problems:   S/P total knee arthroplasty  Acute Blood Loss Anemia   Plan: Physical Therapy as ordered Weight Bearing as Tolerated (WBAT)  DVT Prophylaxis:  Lovenox  DISCHARGE PLAN: Home  DISCHARGE NEEDS: HHPT         Donia Ast 05/04/2015, 7:01 AM

## 2015-05-04 NOTE — Progress Notes (Signed)
Occupational Therapy Treatment/Discharge Patient Details Name: Tamara Pope MRN: [id number] DOB: 10/14/1945 Today's Date: 05/04/2015    History of present illness Pt admitted 1/30 for elective R TKA. PMH: history of DVT, arthritis, appendectomy, rotator cuff repair, and breast enhancement surgery.   OT comments  Pt progressing very well towards occupational therapy goals. Pt completed all functional transfers at supervision level with 1 verbal cue for safe hand placement and completed LB ADLs with min assist from husband. Educated pt/husband on fall prevention, energy conservation, gradually increasing activity level, and pain/edema management strategies. All education has been completed and pt has no further questions. Pt with no further acute OT needs. OT signing off.   Follow Up Recommendations  No OT follow up;Supervision - Intermittent    Equipment Recommendations  None recommended by OT    Recommendations for Other Services      Precautions / Restrictions Precautions Precautions: Knee Precaution Booklet Issued: No Precaution Comments: Reviewed not placing pillow under knee Restrictions Weight Bearing Restrictions: Yes RLE Weight Bearing: Weight bearing as tolerated       Mobility Bed Mobility Overal bed mobility: Needs Assistance Bed Mobility: Supine to Sit;Sit to Supine     Supine to sit: Supervision Sit to supine: Supervision   General bed mobility comments: HOB flat, no use of bedrails to simulate home environment. Supervision for safety - pt with increased knee pain and diminshed ability to move RLE efficiently on/off bed  Transfers Overall transfer level: Needs assistance Equipment used: Rolling walker (2 wheeled) Transfers: Sit to/from Stand Sit to Stand: Supervision Stand pivot transfers: Supervision       General transfer comment: Supervision for safety. Verbal cues x1 for safe hand placement on seated surface before sitting - pt immediately stood  back up and practiced the correct way    Balance Overall balance assessment: Needs assistance Sitting-balance support: No upper extremity supported;Feet supported Sitting balance-Leahy Scale: Good     Standing balance support: No upper extremity supported;During functional activity Standing balance-Leahy Scale: Fair Standing balance comment: Able to maintain balance without UE support during dressing tasks                   ADL Overall ADL's : Needs assistance/impaired                     Lower Body Dressing: Minimal assistance;Sit to/from stand;Cueing for sequencing Lower Body Dressing Details (indicate cue type and reason): Pt able to don underwear and pants independently, required min assist for socks         Tub/ Shower Transfer: Walk-in shower;Supervision/safety;Cueing for sequencing;Ambulation;Rolling walker Tub/Shower Transfer Details (indicate cue type and reason): cues for step sequence with RW Functional mobility during ADLs: Supervision/safety;Rolling walker General ADL Comments: Reviewed compensatory strategies for functional transfers and ADLs. Educated pt/husband on fall prevention, energy conservation, gradually increasing activity level, and pain/edema management strategies.       Vision                     Perception     Praxis      Cognition   Behavior During Therapy: [facility] for tasks assessed/performed Overall Cognitive Status: Within Functional Limits for tasks assessed                       Extremity/Trunk Assessment               Exercises     Shoulder Instructions  General Comments      Pertinent Vitals/ Pain       Pain Assessment: 0-10 Pain Score: 4  Pain Location: R knee Pain Descriptors / Indicators: Aching Pain Intervention(s): Limited activity within patient's tolerance;Monitored during session;Premedicated before session;Repositioned;Ice applied  Home Living                                           Prior Functioning/Environment              Frequency       Progress Toward Goals  OT Goals(current goals can now be found in the care plan section)  Progress towards OT goals: Goals met/education completed, patient discharged from OT  Acute Rehab OT Goals Patient Stated Goal: to go home OT Goal Formulation: With patient Time For Goal Achievement: 05/17/15 Potential to Achieve Goals: Good ADL Goals Pt Will Perform Grooming: with modified independence;standing Pt Will Perform Lower Body Bathing: with min assist;with caregiver independent in assisting;sit to/from stand Pt Will Perform Lower Body Dressing: with min assist;with caregiver independent in assisting;sit to/from stand Pt Will Transfer to Toilet: with modified independence;ambulating;bedside commode Pt Will Perform Toileting - Clothing Manipulation and hygiene: with modified independence;sit to/from stand Pt Will Perform Tub/Shower Transfer: Shower transfer;with supervision;ambulating;shower seat;rolling walker  Plan All goals met and education completed, patient discharged from OT services    Co-evaluation                 End of Session Equipment Utilized During Treatment: Gait belt;Rolling walker   Activity Tolerance Patient tolerated treatment well   Patient Left in bed;with call bell/phone within reach;with family/visitor present;Other (comment) (0 degree bone foam applied, ice applied)   Nurse Communication Mobility status        Time: 8756-4332 OT Time Calculation (min): 24 min  Charges: OT General Charges $OT Visit: 1 Procedure OT Treatments $Self Care/Home Management : 23-37 mins  Redmond Baseman, OTR/L Pager: [phone] 05/04/2015, 11:51 AM

## 2015-05-04 NOTE — Op Note (Signed)
PATIENT ID:      [name]  MRN:     [id number] DOB/AGE:    February 21, 1946 / 71 y.o.                                             _____________________________           Lenore Manner ASSISTANT NOTE        I assisted Surgeon:  Lara Mulch, MD   ON THE PROCEDURE:  Procedure(s): RIGHT TOTAL KNEE ARTHROPLASTY on1/30/17    I provided my assistance on this case for assistance with exposure, retraction, bleeding control, instrumentation, and closure         Electronicallly signed by [place] Biana Haggar 05/04/2015  7:04 AM

## 2015-05-11 NOTE — Discharge Summary (Signed)
SPORTS MEDICINE & JOINT REPLACEMENT   Tamara Mulch, MD   Carlynn Spry, PA-C Carlyon Shadow, PA-C [place], [place], [place]  [address]                             [phone]  PATIENT ID: Tamara Pope        MRN:  [id number]          DOB/AGE: 72/72/1947 / 70 y.o.    DISCHARGE SUMMARY  ADMISSION DATE:    05/03/2015 DISCHARGE DATE:   05/04/2015   ADMISSION DIAGNOSIS: primary osteoarthritis right knee    DISCHARGE DIAGNOSIS:  primary osteoarthritis right knee    ADDITIONAL DIAGNOSIS: Active Problems:   S/P total knee arthroplasty  Past Medical History  Diagnosis Date  . PMB (postmenopausal bleeding)   . History of DVT (deep vein thrombosis)     JAN 2002-- S/P BREAST IMPLANTS  RIGHT UPPER ARM (AXILLARY/ SUBCLAVIN VEIN)  . Arthritis     PROCEDURE: Procedure(s): RIGHT TOTAL KNEE ARTHROPLASTY on 05/03/2015  CONSULTS:     HISTORY:  See H&P in chart  HOSPITAL COURSE:  Tamara Pope is a 70 y.o. admitted on 05/03/2015 and found to have a diagnosis of primary osteoarthritis right knee.  After appropriate laboratory studies were obtained  they were taken to the operating room on 05/03/2015 and underwent Procedure(s): RIGHT TOTAL KNEE ARTHROPLASTY.   They were given perioperative antibiotics:  Anti-infectives    Start     Dose/Rate Route Frequency Ordered Stop   05/03/15 1300  ceFAZolin (ANCEF) IVPB 1 g/50 mL premix     1 g 100 mL/hr over 30 Minutes Intravenous Every 6 hours 05/03/15 1127 05/03/15 1921   05/03/15 0700  ceFAZolin (ANCEF) IVPB 2 g/50 mL premix     2 g 100 mL/hr over 30 Minutes Intravenous To [facility] 04/30/15 0754 05/03/15 0742    .  Patient given tranexamic acid IV or topical and exparel intra-operatively.  Tolerated the procedure well.    POD# 1: Vital signs were stable.  Patient denied Chest pain, shortness of breath, or calf pain.  Patient was started on Lovenox 30 mg subcutaneously twice daily at 8am.  Consults to PT, OT, and care  management were made.  The patient was weight bearing as tolerated.  CPM was placed on the operative leg 0-90 degrees for 6-8 hours a day. When out of the CPM, patient was placed in the foam block to achieve full extension. Incentive spirometry was taught.  Dressing was changed.       , Continued  PT for ambulation and exercise program.  IV saline locked.  O2 discontinued.    The remainder of the hospital course was dedicated to ambulation and strengthening.   The patient was discharged on 1 day post op in  Good condition.  Blood products given:none  DIAGNOSTIC STUDIES: Recent vital signs: No data found.      Recent laboratory studies: No results for input(s): WBC, HGB, HCT, PLT in the last 168 hours. No results for input(s): NA, K, CL, CO2, BUN, CREATININE, GLUCOSE, CALCIUM in the last 168 hours. Lab Results  Component Value Date   INR 1.09 04/19/2015     Recent Radiographic Studies :  Dg Chest 2 View  04/19/2015  CLINICAL DATA:  Knee arthroplasty. EXAM: CHEST  2 VIEW COMPARISON:  No prior. FINDINGS: Mediastinum and hilar structures normal. Low lung volumes with mild bibasilar atelectasis. No  pleural effusion or pneumothorax. Heart size normal. No acute bony abnormality . IMPRESSION: Low lung volumes with mild bibasilar atelectasis. Electronically Signed   By: Marcello Moores  Register   On: 04/19/2015 09:56    DISCHARGE INSTRUCTIONS:   DISCHARGE MEDICATIONS:     Medication List    STOP taking these medications        aspirin EC 81 MG tablet     CALCIUM + D PO     estradiol 0.05 MG/24HR patch  Commonly known as:  VIVELLE-DOT     magnesium gluconate 500 MG tablet  Commonly known as:  MAGONATE      TAKE these medications        ALIGN 4 MG Caps  Take 1 capsule by mouth daily.     B-12 5000 MCG Subl  Place 1 tablet under the tongue daily.     Biotin 5000 MCG Tabs  Take 1 tablet by mouth daily.     cetirizine 10 MG tablet  Commonly known as:  ZYRTEC  Take 10 mg by mouth  daily.     COQ10 PO  Take 1 capsule by mouth daily.     docusate sodium 100 MG capsule  Commonly known as:  COLACE  Take 1 capsule (100 mg total) by mouth 2 (two) times daily.     enoxaparin 40 MG/0.4ML injection  Commonly known as:  LOVENOX  Inject 0.4 mLs (40 mg total) into the skin daily.     fluticasone 50 MCG/ACT nasal spray  Commonly known as:  FLONASE  Place 1 spray into both nostrils daily as needed for allergies or rhinitis.     methocarbamol 500 MG tablet  Commonly known as:  ROBAXIN  Take 1-2 tablets (500-1,000 mg total) by mouth every 6 (six) hours as needed for muscle spasms.     Omega 3 1000 MG Caps  Take 1 capsule by mouth daily.     ondansetron 4 MG tablet  Commonly known as:  ZOFRAN  Take 1 tablet (4 mg total) by mouth every 6 (six) hours as needed for nausea.     oxyCODONE 5 MG immediate release tablet  Commonly known as:  Oxy IR/ROXICODONE  Take 1-2 tablets (5-10 mg total) by mouth every 4 (four) hours as needed for moderate pain or severe pain.     progesterone 100 MG capsule  Commonly known as:  PROMETRIUM  Take 100 mg by mouth daily.     venlafaxine XR 75 MG 24 hr capsule  Commonly known as:  EFFEXOR-XR  Take 75 mg by mouth daily with breakfast.     vitamin C 500 MG tablet  Commonly known as:  ASCORBIC ACID  Take 500 mg by mouth daily.        FOLLOW UP VISIT:       Follow-up Information    Follow up with [facility]   Why:  Someone from [facility] [place] [facility] will contact you concerning start date and time for therapy.   Contact information:   [address] [place] [address] [phone]       Follow up with Rudean Haskell, MD. Call on 05/18/2015.   Specialty:  Orthopedic Surgery   Contact information:   [place] [address] [phone]       DISPOSITION: HOME  CONDITION:  Keenan Bachelor 05/11/2015, 12:31 PM

## 2015-05-19 ENCOUNTER — Ambulatory Visit: Payer: 59 | Attending: Orthopedic Surgery | Admitting: Physical Therapy

## 2015-05-19 ENCOUNTER — Encounter: Payer: Self-pay | Admitting: Physical Therapy

## 2015-05-19 DIAGNOSIS — M25561 Pain in right knee: Secondary | ICD-10-CM | POA: Diagnosis not present

## 2015-05-19 DIAGNOSIS — M7989 Other specified soft tissue disorders: Secondary | ICD-10-CM | POA: Insufficient documentation

## 2015-05-19 DIAGNOSIS — R262 Difficulty in walking, not elsewhere classified: Secondary | ICD-10-CM

## 2015-05-19 DIAGNOSIS — M25661 Stiffness of right knee, not elsewhere classified: Secondary | ICD-10-CM

## 2015-05-19 DIAGNOSIS — R29898 Other symptoms and signs involving the musculoskeletal system: Secondary | ICD-10-CM | POA: Insufficient documentation

## 2015-05-19 NOTE — Therapy (Signed)
[place], [place], [address] Phone: [phone]   Fax:  [phone]  Physical Therapy Evaluation  Patient Details  Name: Tamara Pope MRN: [id number] Date of Birth: [birth year] Referring Provider: Ronnie Derby  Encounter Date: 05/19/2015      PT End of Session - 05/19/15 1145    Visit Number 1   Date for PT Re-Evaluation 07/17/15   PT Start Time 1100   PT Stop Time 1200   PT Time Calculation (min) 60 min   Activity Tolerance Patient tolerated treatment well   Behavior During Therapy [facility] for tasks assessed/performed      Past Medical History  Diagnosis Date  . PMB (postmenopausal bleeding)   . History of DVT (deep vein thrombosis)     JAN 2002-- S/P BREAST IMPLANTS  RIGHT UPPER ARM (AXILLARY/ SUBCLAVIN VEIN)  . Arthritis     Past Surgical History  Procedure Laterality Date  . Breast enhancement surgery Bilateral NOV 2001  . Bilateral breast lift and implant reduction  SEPT 2014  . Tonsillectomy  AS CHILD  . Appendectomy  AS  CHILD  . Knee arthroscopy w/ meniscectomy Right 2011  . Rotator cuff repair Right 2005  . Cataract extraction w/ intraocular lens  implant, bilateral    . Dilatation & currettage/hysteroscopy with resectocope N/A 04/18/2013    Procedure: DILATATION & CURETTAGE/HYSTEROSCOPY ;  Surgeon: [name], MD;  Location: [facility];  Service: Gynecology;  Laterality: N/A;  . Total knee arthroplasty Right 05/03/2015    Procedure: RIGHT TOTAL KNEE ARTHROPLASTY;  Surgeon: Vickey Huger, MD;  Location: [facility];  Service: Orthopedics;  Laterality: Right;    There were no vitals filed for this visit.  Visit Diagnosis:  Right knee pain - Plan: PT plan of care cert/re-cert  Swelling of limb - Plan: PT plan of care cert/re-cert  Decreased ROM of right knee - Plan: PT plan of care cert/re-cert  Difficulty walking - Plan: PT plan of care cert/re-cert       Subjective Assessment - 05/19/15 1100    Subjective Patient underwent a right TKR on 05/03/15 due to bone on bone.  Reports that the recovery has been okay.   Limitations Lifting;Standing;Walking   Currently in Pain? Yes   Pain Score 3    Pain Location Knee   Pain Orientation Right   Pain Descriptors / Indicators Aching;Discomfort;Tightness   Pain Type Surgical pain   Pain Onset 1 to 4 weeks ago   Pain Frequency Constant   Aggravating Factors  bending, standing, walking, pain up to 8/10   Pain Relieving Factors rest and ice   Effect of Pain on Daily Activities walk and take yoga classes            [facility] PT Assessment - 05/19/15 0001    Assessment   Medical Diagnosis s/p right TKR 05/03/15   Referring Provider [place]   Onset Date/Surgical Date 05/03/15   Prior Therapy home   Precautions   Precautions None   Balance Screen   Has the patient fallen in the past 6 months No   Has the patient had a decrease in activity level because of a fear of falling?  No   Is the patient reluctant to leave their home because of a fear of falling?  No   Home Environment   Additional Comments stairs, does housework   Prior Function   Level of Independence Independent   Leisure was  exercising with yoga and light aerobics in the past   Observation/Other Assessments-Edema    Edema Circumferential   Circumferential Edema   Circumferential - Right 42cm   Circumferential - Left  38.5 cm   AROM   Overall AROM Comments AROM of the right knee 14-84 degrees flexion   PROM   Overall PROM Comments PROM of the right knee 7-90 degrees flexion   Strength   Overall Strength Comments 4/5   Flexibility   Soft Tissue Assessment /Muscle Length --  some calf and HS tightness   Palpation   Palpation comment very mild warmth, has steri strips in place, scar is mobile as is the patella   Special Tests    Special Tests --  quad lag   Ambulation/Gait   Gait Comments no assistive device, stiff legged, slight  circumduction                   [facility] Adult PT Treatment/Exercise - 05/19/15 0001    Exercises   Exercises Knee/Hip   Knee/Hip Exercises: Aerobic   Nustep Level 2 x 6 minutes mostly working on ROM   Knee/Hip Exercises: Machines for Strengthening   Cybex Leg Press 20# x10, right only no weight x10 reps, then no weight working on flexion   Modalities   Modalities Vasopneumatic   Vasopneumatic   Number Minutes Vasopneumatic  15 minutes   Vasopnuematic Location  Knee   Vasopneumatic Pressure Medium   Vasopneumatic Temperature  36                PT Education - 05/19/15 1145    Education provided Yes   Education Details low load long duration flexion and extension stretches   Person(s) Educated Patient   Methods Explanation;Demonstration;Handout   Comprehension Verbalized understanding;Returned demonstration;Verbal cues required          PT Short Term Goals - 05/19/15 1200    PT SHORT TERM GOAL #1   Title independent with intial HEP   Time 1   Period Weeks   Status New           PT Long Term Goals - 05/19/15 1200    PT LONG TERM GOAL #1   Title report pain decreased 50%   Time 8   Period Weeks   Status New   PT LONG TERM GOAL #2   Title increase right knee AROM to 0-120 degrees flexion   Time 8   Period Weeks   Status New   PT LONG TERM GOAL #3   Title walk all community distances without issue with good pattern   Time 8   Period Weeks   Status New   PT LONG TERM GOAL #4   Title understand RICE   Time 8   Period Weeks   Status New               Plan - 05/19/15 1146    Clinical Impression Statement Patient underwent a right TKR on 05/03/15.  She has some swelling, and decreased ROM of the knee, tends to walk with a stiff right knee.  She had some anxiety but did well once she got moving.  She will be traveling back and forth from here to [place], she will recieve PT there as well to continue with the progression following the  surgery.   Pt will benefit from skilled therapeutic intervention in order to improve on the following deficits Abnormal gait;Decreased activity tolerance;Decreased mobility;Decreased range of motion;Decreased scar mobility;Decreased strength;Increased  edema;Difficulty walking;Increased fascial restricitons;Impaired flexibility;Pain   Rehab Potential Good   PT Frequency 2x / week   PT Duration 8 weeks   PT Treatment/Interventions ADLs/Self Care Home Management;Cryotherapy;Electrical Stimulation;Therapeutic exercise;Therapeutic activities;Functional mobility training;Stair training;Gait training;Balance training;Neuromuscular re-education;Patient/family education;Manual techniques;Passive range of motion   PT Next Visit Plan Will see here on Friday and then she will go to [place] for about 4 weeks and then will return, I will place her on hold after our visit on Friday   Consulted and Agree with Plan of Care Patient          G-Codes - 2015-06-03 1204    Functional Assessment Tool Used Foto    Functional Limitation Mobility: Walking and moving around   Mobility: Walking and Moving Around Current Status [id number]) At least 60 percent but less than 80 percent impaired, limited or restricted   Mobility: Walking and Moving Around Goal Status [id number]) At least 40 percent but less than 60 percent impaired, limited or restricted       Problem List Patient Active Problem List   Diagnosis Date Noted  . S/P total knee arthroplasty 05/03/2015    Sumner Boast., PT 06-03-2015, 12:14 PM  [place] [address] [place], [place], [address] Phone: [phone]   Fax:  [phone]  Name: Tamara Pope MRN: [id number] Date of Birth: Dec 08, 1945

## 2015-05-21 ENCOUNTER — Ambulatory Visit: Payer: 59 | Admitting: Physical Therapy

## 2015-05-21 ENCOUNTER — Encounter: Payer: Self-pay | Admitting: Physical Therapy

## 2015-05-21 DIAGNOSIS — M25661 Stiffness of right knee, not elsewhere classified: Secondary | ICD-10-CM

## 2015-05-21 DIAGNOSIS — M25561 Pain in right knee: Secondary | ICD-10-CM | POA: Diagnosis not present

## 2015-05-21 DIAGNOSIS — M7989 Other specified soft tissue disorders: Secondary | ICD-10-CM

## 2015-05-21 NOTE — Therapy (Addendum)
[place], [place], [address] Phone: [phone]   Fax:  [phone]  Physical Therapy Treatment  Patient Details  Name: Tamara Pope MRN: [id number] Date of Birth: 11-02-1945 Referring Provider: Ronnie Derby  Encounter Date: 05/21/2015      PT End of Session - 05/21/15 1135    Visit Number 2   Date for PT Re-Evaluation 07/17/15   PT Start Time 1100   PT Stop Time 1155   PT Time Calculation (min) 55 min      Past Medical History  Diagnosis Date  . PMB (postmenopausal bleeding)   . History of DVT (deep vein thrombosis)     JAN 2002-- S/P BREAST IMPLANTS  RIGHT UPPER ARM (AXILLARY/ SUBCLAVIN VEIN)  . Arthritis     Past Surgical History  Procedure Laterality Date  . Breast enhancement surgery Bilateral NOV 2001  . Bilateral breast lift and implant reduction  SEPT 2014  . Tonsillectomy  AS CHILD  . Appendectomy  AS  CHILD  . Knee arthroscopy w/ meniscectomy Right 2011  . Rotator cuff repair Right 2005  . Cataract extraction w/ intraocular lens  implant, bilateral    . Dilatation & currettage/hysteroscopy with resectocope N/A 04/18/2013    Procedure: DILATATION & CURETTAGE/HYSTEROSCOPY ;  Surgeon: [name], MD;  Location: [facility];  Service: Gynecology;  Laterality: N/A;  . Total knee arthroplasty Right 05/03/2015    Procedure: RIGHT TOTAL KNEE ARTHROPLASTY;  Surgeon: Vickey Huger, MD;  Location: [facility];  Service: Orthopedics;  Laterality: Right;    There were no vitals filed for this visit.  Visit Diagnosis:  Right knee pain  Swelling of limb  Decreased ROM of right knee      Subjective Assessment - 05/21/15 1105    Subjective stiffness but doing better   Currently in Pain? Yes   Pain Score 2    Pain Location Knee   Pain Orientation Right            [facility] PT Assessment - 05/21/15 0001    AROM   Overall AROM Comments 4- 105                      [facility] Adult PT Treatment/Exercise - 05/21/15 0001    Ambulation/Gait   Gait Comments working on heel strike to increase ext   Knee/Hip Exercises: Aerobic   Stationary Bike 51mn   Nustep L 4 66m   Knee/Hip Exercises: Machines for Strengthening   Cybex Knee Extension 5# 2 sets 10   Cybex Knee Flexion 10# 2 sets 10 RT leg only   Knee/Hip Exercises: Seated   Long Arc Quad Strengthening;Right;1 set;10 reps  red tband   Hamstring Curl Strengthening;Right;10 reps  red tband   Knee/Hip Exercises: Supine   Terminal Knee Extension Strengthening;Right;10 reps;Theraband   Theraband Level (Terminal Knee Extension) Level 2 (Red)   Modalities   Modalities Vasopneumatic   Vasopneumatic   Number Minutes Vasopneumatic  15 minutes   Vasopnuematic Location  Knee   Vasopneumatic Pressure Medium   Vasopneumatic Temperature  36   Manual Therapy   Manual Therapy Joint mobilization;Passive ROM  patellar mobs                  PT Short Term Goals - 05/21/15 1137    PT SHORT TERM GOAL #1   Title independent with intial HEP   Status Achieved  PT Long Term Goals - 05/19/15 1200    PT LONG TERM GOAL #1   Title report pain decreased 50%   Time 8   Period Weeks   Status New   PT LONG TERM GOAL #2   Title increase right knee AROM to 0-120 degrees flexion   Time 8   Period Weeks   Status New   PT LONG TERM GOAL #3   Title walk all community distances without issue with good pattern   Time 8   Period Weeks   Status New   PT LONG TERM GOAL #4   Title understand RICE   Time 8   Period Weeks   Status New               Plan - 05/21/15 1135    Clinical Impression Statement pt with signficant increase in ROM and improved gait, working on heel strike with flexion.   PT Next Visit Plan Pt will be in [place] in next month and approval from insurance ( per husband) to resume PT there. Chart will stay open here for return to [place].         Problem List Patient Active Problem List   Diagnosis Date Noted  . S/P total knee arthroplasty 05/03/2015   PHYSICAL THERAPY DISCHARGE SUMMARY  Visits from Start of Care: 2  Plan: Patient agrees to discharge.  Patient goals were partially met. Patient is being discharged due to being pleased with the current functional level.  ?????       Shelva Hetzer,ANGIE PTA 05/21/2015, 11:37 AM  [place], [place], [address] Phone: [phone]   Fax:  [phone]  Name: CHARNAE LILL MRN: [id number] Date of Birth: [birth year]

## 2015-07-26 DIAGNOSIS — Z0000 Encounter for general adult medical examination without abnormal findings: Secondary | ICD-10-CM | POA: Insufficient documentation

## 2015-11-17 ENCOUNTER — Other Ambulatory Visit: Payer: Self-pay | Admitting: Gastroenterology

## 2015-11-17 ENCOUNTER — Ambulatory Visit
Admission: RE | Admit: 2015-11-17 | Discharge: 2015-11-17 | Disposition: A | Discharge status: Home / Self Care | Payer: Self-pay | Source: Ambulatory Visit | Attending: Gastroenterology | Admitting: Gastroenterology

## 2015-11-17 DIAGNOSIS — Z1211 Encounter for screening for malignant neoplasm of colon: Secondary | ICD-10-CM

## 2015-11-17 DIAGNOSIS — K5730 Diverticulosis of large intestine without perforation or abscess without bleeding: Secondary | ICD-10-CM

## 2015-11-17 DIAGNOSIS — Z86010 Personal history of colonic polyps: Secondary | ICD-10-CM

## 2016-03-21 DIAGNOSIS — M79672 Pain in left foot: Secondary | ICD-10-CM | POA: Insufficient documentation

## 2016-05-04 DIAGNOSIS — M19079 Primary osteoarthritis, unspecified ankle and foot: Secondary | ICD-10-CM | POA: Insufficient documentation

## 2016-12-11 DIAGNOSIS — E785 Hyperlipidemia, unspecified: Secondary | ICD-10-CM | POA: Diagnosis not present

## 2017-01-02 DIAGNOSIS — R490 Dysphonia: Secondary | ICD-10-CM | POA: Diagnosis not present

## 2017-01-02 DIAGNOSIS — K219 Gastro-esophageal reflux disease without esophagitis: Secondary | ICD-10-CM | POA: Diagnosis not present

## 2017-01-05 ENCOUNTER — Encounter: Payer: Self-pay | Admitting: Internal Medicine

## 2017-01-11 DIAGNOSIS — E039 Hypothyroidism, unspecified: Secondary | ICD-10-CM | POA: Diagnosis not present

## 2017-01-15 DIAGNOSIS — Z23 Encounter for immunization: Secondary | ICD-10-CM | POA: Diagnosis not present

## 2017-02-05 ENCOUNTER — Ambulatory Visit (INDEPENDENT_AMBULATORY_CARE_PROVIDER_SITE_OTHER): Payer: Medicare Other | Admitting: Internal Medicine

## 2017-02-05 ENCOUNTER — Encounter (INDEPENDENT_AMBULATORY_CARE_PROVIDER_SITE_OTHER): Payer: Self-pay

## 2017-02-05 ENCOUNTER — Encounter: Payer: Self-pay | Admitting: Internal Medicine

## 2017-02-05 VITALS — BP 128/88 | HR 90 | Ht 64.0 in | Wt 157.0 lb

## 2017-02-05 DIAGNOSIS — Z1211 Encounter for screening for malignant neoplasm of colon: Secondary | ICD-10-CM

## 2017-02-05 DIAGNOSIS — R490 Dysphonia: Secondary | ICD-10-CM | POA: Diagnosis not present

## 2017-02-05 DIAGNOSIS — J302 Other seasonal allergic rhinitis: Secondary | ICD-10-CM | POA: Diagnosis not present

## 2017-02-05 NOTE — Patient Instructions (Addendum)
  We are going to get you an appointment with the [facility] at [facility], hopefully Dr Addison Bailey.  Their phone # is [phone].   I appreciate the opportunity to care for you. Silvano Rusk, MD, [facility]

## 2017-02-05 NOTE — Progress Notes (Signed)
Tamara Pope 71 y.o. [birth year] [id number]  Assessment & Plan:   Encounter Diagnoses  Name Primary?  . Hoarseness, chronic Yes  . Seasonal allergies   . Colon cancer screening     I doubt this is GERD given failure to respond to bid high dose PPI x 2 mos after a few mos at 20 mg bid, plus lifestyle changes. As best I can tell she has not had a flexible laryngoscopy and I think she needs one. Will refer to voice center at [facility] - Dr. Joya Gaskins and colleagues. I ? If she could have spastic dysphonia, but lesions of vocal cords may be possible also. Doubt related to her allergies. I do not think an EGD will help her and would do an ENT eval (w/ laryngoscopy) prior to any pH or impedance testing which are unlikely to yield an abnormality related to hoarseness  She may need to have a complete colonscopy vs other screening - Dr. Sandi Mariscal is obtaining prior records. I have never heard of a 10 day prep and think we could get her prepped with a 2 day prep at most  I appreciate the opportunity to care for this patient. TK:[id number], Collier Salina, MD     Subjective:   Chief Complaint: GERD?- hoarseness  HPI This is a very nice woman here referred by Dr. Sandi Mariscal because of hoarseness.  She has had several months of the symptoms which were new.  In the late winter she had an episode of bronchitis and strep.  She was having some intermittent hoarseness before then.  But when she talks her voice will go away, but there can be several days where she can speak and be understandable on the phone etc. but still be somewhat hoarse.  But then she has severe episodes.  She was seen by Dr. Constance Holster who advised her to avoid caffeine citrus alcohol and other reflux triggers based on a working dx of GERD it seems, and she was also treated with 20 mg twice daily of Nexium and then 40 mg twice daily of Nexium.  20 mg dose for 2 or 3 months and for the past 2 months 40 mg.  She has not noticed any difference in her  symptoms.  If she tries to drink red wine it will burn some.  However she does not have any dysphagia.  I do not have the exact records but she tells me that when she saw Dr. Constance Holster, he looked with a speculum but did not do a laryngoscopy.  She is never had classic heartburn or reflux symptoms. She does have some seasonal allergies and Dr. Sandi Mariscal added monteleukast to her regimen recently but has not helped. No sig post nasal drip or drainage.  She had an attempt at colonoscopy in the past 1-2 yrs but had inadequate prep and then was recommended to have a "10 day prep" for another colonoscopy but chose not to do so.   No Known Allergies Current Meds  Medication Sig  . ALPRAZolam (XANAX) 0.25 MG tablet TK 1 T PO Q 8 H PRF ANXIETY  . Biotin 5000 MCG TABS Take 1 tablet by mouth daily.  . cetirizine (ZYRTEC) 10 MG tablet Take 10 mg by mouth daily.  . Cyanocobalamin (B-12) 5000 MCG SUBL Place 1 tablet under the tongue daily.  Marland Kitchen esomeprazole (NEXIUM) 40 MG capsule   . estradiol (VIVELLE-DOT) 0.05 MG/24HR patch APPLY 1 PATCH EXTERNALY TO SKIN TWICE WEEKLY AS DIRECTED  . fluticasone (FLONASE) 50 MCG/ACT nasal  spray Place 1 spray into both nostrils daily as needed for allergies or rhinitis.  . Probiotic Product (ALIGN) 4 MG CAPS Take 1 capsule by mouth daily.  . progesterone (PROMETRIUM) 100 MG capsule Take 100 mg by mouth daily.  Marland Kitchen venlafaxine XR (EFFEXOR-XR) 75 MG 24 hr capsule Take 75 mg by mouth daily with breakfast.  . vitamin C (ASCORBIC ACID) 500 MG tablet Take 500 mg by mouth daily.  .    .    .    .    .    .    Marland Kitchen     Past Medical History:  Diagnosis Date  . Arthritis   . History of DVT (deep vein thrombosis)    JAN 2002-- S/P BREAST IMPLANTS  RIGHT UPPER ARM (AXILLARY/ SUBCLAVIN VEIN)  . PMB (postmenopausal bleeding)    Past Surgical History:  Procedure Laterality Date  . APPENDECTOMY  AS  CHILD  . BILATERAL BREAST LIFT AND IMPLANT REDUCTION  SEPT 2014  . BREAST ENHANCEMENT  SURGERY Bilateral NOV 2001  . CATARACT EXTRACTION W/ INTRAOCULAR LENS  IMPLANT, BILATERAL    . KNEE ARTHROSCOPY W/ MENISCECTOMY Right 2011  . ROTATOR CUFF REPAIR Right 2005  . TONSILLECTOMY  AS CHILD   Social History   Tobacco Use  . Smoking status: Never Smoker  . Smokeless tobacco: Never Used  Substance and Sexual Activity  . Alcohol use: Yes    Comment: maybe 2glasses a week  . Drug use: No  . Sexual activity: Not Currently    Partners: Male   Social History   Social History Narrative   Married   Orig from [place], [place]   Has been Paediatric nurse   At least 1 daughter   Second home [employer] - enjoys yoga    family history includes Diabetes in her brother; Lung cancer in her father.   Review of Systems Anxiety All other ROS negative or as per HPI  Objective:   Physical Exam  @BP  128/88   Pulse 90   Ht 5\' 4"  (1.626 m)   Wt 157 lb (71.2 kg)   BMI 26.95 kg/m @  General:  Well-developed, well-nourished and in no acute distress Eyes:  anicteric. ENT:   Mouth and posterior pharynx free of lesions.  Neck:   supple w/o thyromegaly or mass.  Lungs: Clear to auscultation bilaterally. Heart:  S1S2, no rubs, murmurs, gallops. Abdomen:  soft, non-tender, no hepatosplenomegaly, hernia, or mass and BS+.  Lymph:  no cervical or supraclavicular adenopathy. Extremities:   no edema, cyanosis or clubbing Skin   no rash. Neuro:  A&O x 3.  Psych:  appropriate mood and  Affect.   Data Reviewed:  Labs, PCP notes from 2018

## 2017-02-06 ENCOUNTER — Telehealth: Payer: Self-pay

## 2017-02-06 ENCOUNTER — Telehealth: Payer: Self-pay | Admitting: Internal Medicine

## 2017-02-06 NOTE — Telephone Encounter (Signed)
Spoke with Amy at the [place] at [facility] in [place], Drs phone # [phone], fax # [phone].  Patient phone # [phone].  Appointment made with Dr Addison Bailey for Monday December 17th at 11:30AM, arrive at 11:15AM.  Appointment set up for a voice eval December 17th at 12:30PM in case Dr Joya Gaskins wants her to have the eval it can be done the same day.  They are located at [address] [place] [address].  They are going to mail her paperwork.  I've left Tamara Pope a detailed phone message with this information. I'll fax the office note to them after Dr Carlean Purl completes it.

## 2017-02-06 NOTE — Telephone Encounter (Signed)
Unfortunately I don't think I have any sway there.  I do think she could go back to Dr. Constance Holster and get reassessed vs seeing another ENT here in town and still wait to see Dr. Joya Gaskins. She should be able to at least get more evaluation of the vocal cords than it sounds like she got (I mean a video laryngoscopy)

## 2017-02-06 NOTE — Telephone Encounter (Signed)
Patient called back with questions and I have called her back and had to leave her a message to call me back.

## 2017-02-06 NOTE — Telephone Encounter (Signed)
Tamara Pope called back and said she called Dr Bettina Gavia office and has gotten on their waiting list.  She wonders if her ENT she's seen calls would it help to get her in sooner or if you call Dr Joya Gaskins would it help.

## 2017-02-06 NOTE — Telephone Encounter (Signed)
See phone note regarding the [facility] appointment.

## 2017-02-07 ENCOUNTER — Other Ambulatory Visit: Payer: Self-pay | Admitting: Family Medicine

## 2017-02-07 DIAGNOSIS — R221 Localized swelling, mass and lump, neck: Secondary | ICD-10-CM

## 2017-02-07 DIAGNOSIS — R490 Dysphonia: Secondary | ICD-10-CM

## 2017-02-07 NOTE — Telephone Encounter (Signed)
Left patient detailed message with this information.

## 2017-02-08 ENCOUNTER — Telehealth: Payer: Self-pay | Admitting: Internal Medicine

## 2017-02-08 ENCOUNTER — Ambulatory Visit
Admission: RE | Admit: 2017-02-08 | Discharge: 2017-02-08 | Disposition: A | Discharge status: Home / Self Care | Payer: Medicare Other | Source: Ambulatory Visit | Attending: Family Medicine | Admitting: Family Medicine

## 2017-02-08 ENCOUNTER — Encounter: Payer: Self-pay | Admitting: Internal Medicine

## 2017-02-08 DIAGNOSIS — R221 Localized swelling, mass and lump, neck: Secondary | ICD-10-CM

## 2017-02-08 DIAGNOSIS — R490 Dysphonia: Secondary | ICD-10-CM

## 2017-02-08 DIAGNOSIS — K219 Gastro-esophageal reflux disease without esophagitis: Secondary | ICD-10-CM | POA: Diagnosis not present

## 2017-02-08 NOTE — Telephone Encounter (Signed)
The pt is aware that the results have been sent to Dr Carlean Purl

## 2017-02-09 ENCOUNTER — Telehealth: Payer: Self-pay | Admitting: Internal Medicine

## 2017-02-09 NOTE — Telephone Encounter (Signed)
The pt is aware that her message was sent to Dr Carlean Purl on 11/8 and she will be called as soon as he offers his recommendations.  The pt verbalized understanding.

## 2017-02-14 NOTE — Telephone Encounter (Signed)
Patient calling back.     Thank you

## 2017-02-14 NOTE — Telephone Encounter (Signed)
I think she should continue her medications as she was for now. I did see the barium swallow that showed reflux. That does not prove it is causing her hoarseness though lends support.  Has she been able to get an earlier appointment with ENT? She was on waiting list at [facility]

## 2017-02-14 NOTE — Telephone Encounter (Signed)
Patient calling back regarding this. Best # [phone]

## 2017-02-14 NOTE — Telephone Encounter (Signed)
Patient advised to continue medications for now. Advised of barium swallow results. She does have an appointment with ENT on 12/17 but is hoping to move this appointment up.

## 2017-02-19 DIAGNOSIS — R490 Dysphonia: Secondary | ICD-10-CM | POA: Diagnosis not present

## 2017-02-19 DIAGNOSIS — J385 Laryngeal spasm: Secondary | ICD-10-CM | POA: Diagnosis not present

## 2017-02-19 DIAGNOSIS — J383 Other diseases of vocal cords: Secondary | ICD-10-CM | POA: Diagnosis not present

## 2017-02-27 DIAGNOSIS — J385 Laryngeal spasm: Secondary | ICD-10-CM | POA: Insufficient documentation

## 2017-03-02 DIAGNOSIS — J385 Laryngeal spasm: Secondary | ICD-10-CM | POA: Diagnosis not present

## 2017-03-05 ENCOUNTER — Ambulatory Visit: Payer: 59 | Admitting: Internal Medicine

## 2017-04-12 DIAGNOSIS — Z1231 Encounter for screening mammogram for malignant neoplasm of breast: Secondary | ICD-10-CM | POA: Diagnosis not present

## 2017-04-24 DIAGNOSIS — R490 Dysphonia: Secondary | ICD-10-CM | POA: Diagnosis not present

## 2017-04-24 DIAGNOSIS — J383 Other diseases of vocal cords: Secondary | ICD-10-CM | POA: Diagnosis not present

## 2017-04-24 DIAGNOSIS — J385 Laryngeal spasm: Secondary | ICD-10-CM | POA: Diagnosis not present

## 2017-05-15 DIAGNOSIS — D519 Vitamin B12 deficiency anemia, unspecified: Secondary | ICD-10-CM | POA: Diagnosis not present

## 2017-05-15 DIAGNOSIS — D518 Other vitamin B12 deficiency anemias: Secondary | ICD-10-CM | POA: Diagnosis not present

## 2017-05-15 DIAGNOSIS — B370 Candidal stomatitis: Secondary | ICD-10-CM | POA: Diagnosis not present

## 2017-05-15 DIAGNOSIS — E538 Deficiency of other specified B group vitamins: Secondary | ICD-10-CM | POA: Diagnosis not present

## 2017-05-25 DIAGNOSIS — J385 Laryngeal spasm: Secondary | ICD-10-CM | POA: Diagnosis not present

## 2017-06-21 DIAGNOSIS — R490 Dysphonia: Secondary | ICD-10-CM | POA: Diagnosis not present

## 2017-06-27 ENCOUNTER — Other Ambulatory Visit: Payer: Self-pay | Admitting: Obstetrics and Gynecology

## 2017-06-27 DIAGNOSIS — N6310 Unspecified lump in the right breast, unspecified quadrant: Secondary | ICD-10-CM | POA: Diagnosis not present

## 2017-06-27 DIAGNOSIS — N6001 Solitary cyst of right breast: Secondary | ICD-10-CM

## 2017-06-29 DIAGNOSIS — L309 Dermatitis, unspecified: Secondary | ICD-10-CM | POA: Diagnosis not present

## 2017-06-29 DIAGNOSIS — F411 Generalized anxiety disorder: Secondary | ICD-10-CM | POA: Diagnosis not present

## 2017-07-02 ENCOUNTER — Ambulatory Visit
Admission: RE | Admit: 2017-07-02 | Discharge: 2017-07-02 | Disposition: A | Discharge status: Home / Self Care | Payer: Medicare Other | Source: Ambulatory Visit | Attending: Obstetrics and Gynecology | Admitting: Obstetrics and Gynecology

## 2017-07-02 DIAGNOSIS — R922 Inconclusive mammogram: Secondary | ICD-10-CM | POA: Diagnosis not present

## 2017-07-02 DIAGNOSIS — S60561A Insect bite (nonvenomous) of right hand, initial encounter: Secondary | ICD-10-CM | POA: Diagnosis not present

## 2017-07-02 DIAGNOSIS — N6001 Solitary cyst of right breast: Secondary | ICD-10-CM

## 2017-07-02 DIAGNOSIS — L821 Other seborrheic keratosis: Secondary | ICD-10-CM | POA: Diagnosis not present

## 2017-07-02 DIAGNOSIS — N6311 Unspecified lump in the right breast, upper outer quadrant: Secondary | ICD-10-CM | POA: Diagnosis not present

## 2017-08-29 DIAGNOSIS — D0472 Carcinoma in situ of skin of left lower limb, including hip: Secondary | ICD-10-CM | POA: Diagnosis not present

## 2017-08-29 DIAGNOSIS — L820 Inflamed seborrheic keratosis: Secondary | ICD-10-CM | POA: Diagnosis not present

## 2017-08-29 DIAGNOSIS — L821 Other seborrheic keratosis: Secondary | ICD-10-CM | POA: Diagnosis not present

## 2017-08-29 DIAGNOSIS — D1801 Hemangioma of skin and subcutaneous tissue: Secondary | ICD-10-CM | POA: Diagnosis not present

## 2017-08-29 DIAGNOSIS — L814 Other melanin hyperpigmentation: Secondary | ICD-10-CM | POA: Diagnosis not present

## 2017-08-29 DIAGNOSIS — C44712 Basal cell carcinoma of skin of right lower limb, including hip: Secondary | ICD-10-CM | POA: Diagnosis not present

## 2017-08-29 DIAGNOSIS — D692 Other nonthrombocytopenic purpura: Secondary | ICD-10-CM | POA: Diagnosis not present

## 2017-09-05 DIAGNOSIS — C44712 Basal cell carcinoma of skin of right lower limb, including hip: Secondary | ICD-10-CM | POA: Diagnosis not present

## 2017-09-05 DIAGNOSIS — D0472 Carcinoma in situ of skin of left lower limb, including hip: Secondary | ICD-10-CM | POA: Diagnosis not present

## 2017-09-14 DIAGNOSIS — J385 Laryngeal spasm: Secondary | ICD-10-CM | POA: Diagnosis not present

## 2017-10-11 ENCOUNTER — Encounter: Payer: Self-pay | Admitting: Family Medicine

## 2017-10-11 ENCOUNTER — Ambulatory Visit (INDEPENDENT_AMBULATORY_CARE_PROVIDER_SITE_OTHER): Payer: Medicare Other | Admitting: Family Medicine

## 2017-10-11 VITALS — BP 138/92 | HR 74 | Temp 98.4°F | Ht 64.0 in | Wt 160.8 lb

## 2017-10-11 DIAGNOSIS — F411 Generalized anxiety disorder: Secondary | ICD-10-CM | POA: Insufficient documentation

## 2017-10-11 DIAGNOSIS — Z1159 Encounter for screening for other viral diseases: Secondary | ICD-10-CM

## 2017-10-11 DIAGNOSIS — Z23 Encounter for immunization: Secondary | ICD-10-CM

## 2017-10-11 DIAGNOSIS — K219 Gastro-esophageal reflux disease without esophagitis: Secondary | ICD-10-CM | POA: Diagnosis not present

## 2017-10-11 DIAGNOSIS — J309 Allergic rhinitis, unspecified: Secondary | ICD-10-CM | POA: Diagnosis not present

## 2017-10-11 DIAGNOSIS — J383 Other diseases of vocal cords: Secondary | ICD-10-CM | POA: Diagnosis not present

## 2017-10-11 NOTE — Assessment & Plan Note (Signed)
Well controlled on current dose of PPI.  No changes made today.

## 2017-10-11 NOTE — Assessment & Plan Note (Signed)
Currently receiving botox injections at [facility].Marland Kitchen Has appointment scheduled at [facility] to discuss new treatments being researched.  She is excited about this.

## 2017-10-11 NOTE — Addendum Note (Signed)
Addended by: Lucille Passy on: 10/11/2017 01:28 PM   Modules accepted: Level of Service

## 2017-10-11 NOTE — Patient Instructions (Addendum)
Great to meet you. I will call you with your lab results from today and you can view them online.   Go to [email or web address] to see what you may need for [place].

## 2017-10-11 NOTE — Assessment & Plan Note (Signed)
Well controlled on current dose of Effexor. No changes made today.

## 2017-10-11 NOTE — Progress Notes (Signed)
Subjective:   Patient ID: Tamara Pope, female    DOB: 15-Aug-1945, 72 y.o.   MRN: [id number]  Tamara Pope is a pleasant 72 y.o. year old female who presents to clinic today with New Patient (Initial Visit) (Patient is here today to establish care.  Mammogram completed on 41.19 states to have next screening on 1.2020 at [facility].  Virtual Colonoscopy completed 8.17.17 and showed Cholelithiasis and Aortic Atherosclerosis.  Barium Swallow on 11.8.18 revealed significant GERD.  She states that she switched doctors to Dr. Carlean Purl who Dx with Spasmodic Dysphonia and she is being Tx at [facility] but they recom her to go to [facility] for this.  The only Tx for this is Botox injections to her vocal cords.  She sees) and addendum (She sees Dr. Molli Posey at [facility] and has an appt this month for her yearly PAP.  He takes care of her Mammograms and BMD.  She had a TKR right sided 1.31. 2017 by Dr. Lorre Nick.   She would like to have the Hep-C screening.  She will be travelling to [place] in October.)  on 10/11/2017  HPI:  Here to establish care.  Has GYN- Dr. Matthew Saras.  Has appt this month for yearly visit.  HRT per GYN. Mammogram UTD. Had a virtual colonoscopy in 11/2015.  Spasmodic dysphonia- has been followed at [facility] for this . Receives botox injections for it.  She was referred for further tx at this at [facility] for newer treatment options- has appointment scheduled for later this month. Also takes Nexium 40 mg daily for possible GERD component of her symptoms.  GAD- just decreased effexor to 75 mg daily four months ago.  She feels this dose is working well. Denies any current symptoms of anxiety or depression.    Current Outpatient Medications on File Prior to Visit  Medication Sig Dispense Refill  . Biotin 5000 MCG TABS Take 1 tablet by mouth daily.    . Calcium Carb-Cholecalciferol (CALTRATE 600+D3) 600-800 MG-UNIT TABS Take 1 tablet by mouth 2 (two) times daily.    .  cetirizine (ZYRTEC) 10 MG tablet Take 10 mg by mouth daily.    . Cholecalciferol (VITAMIN D3) 2000 units TABS Take 1 tablet by mouth daily.    Marland Kitchen estradiol (VIVELLE-DOT) 0.05 MG/24HR patch APPLY 1 PATCH EXTERNALY TO SKIN TWICE WEEKLY AS DIRECTED  0  . fluticasone (FLONASE) 50 MCG/ACT nasal spray Place 1 spray into both nostrils daily as needed for allergies or rhinitis.    . Probiotic Product (ALIGN) 4 MG CAPS Take 1 capsule by mouth daily.    . progesterone (PROMETRIUM) 100 MG capsule Take 100 mg by mouth daily.    Marland Kitchen venlafaxine XR (EFFEXOR-XR) 75 MG 24 hr capsule Take 75 mg by mouth daily with breakfast.     No current facility-administered medications on file prior to visit.     No Known Allergies  Past Medical History:  Diagnosis Date  . Allergy   . Arthritis   . Dysphonia, spasmodic   . GERD (gastroesophageal reflux disease)   . History of DVT (deep vein thrombosis)    JAN 2002-- S/P BREAST IMPLANTS  RIGHT UPPER ARM (AXILLARY/ SUBCLAVIN VEIN)  . PMB (postmenopausal bleeding)     Past Surgical History:  Procedure Laterality Date  . APPENDECTOMY  AS  CHILD  . AUGMENTATION MAMMAPLASTY Bilateral    2016  . BILATERAL BREAST LIFT AND IMPLANT REDUCTION  SEPT 2014  . BREAST ENHANCEMENT SURGERY Bilateral  NOV 2001  . CATARACT EXTRACTION W/ INTRAOCULAR LENS  IMPLANT, BILATERAL    . DILATATION & CURRETTAGE/HYSTEROSCOPY WITH RESECTOCOPE N/A 04/18/2013   Procedure: DILATATION & CURETTAGE/HYSTEROSCOPY ;  Surgeon: [name], MD;  Location: [facility];  Service: Gynecology;  Laterality: N/A;  . KNEE ARTHROSCOPY W/ MENISCECTOMY Right 2011  . ROTATOR CUFF REPAIR Right 2005  . TONSILLECTOMY  AS CHILD  . TOTAL KNEE ARTHROPLASTY Right 05/03/2015   Procedure: RIGHT TOTAL KNEE ARTHROPLASTY;  Surgeon: Vickey Huger, MD;  Location: [facility];  Service: Orthopedics;  Laterality: Right;    Family History  Problem Relation Age of Onset  . Arthritis Mother   . Lung cancer Father     . Diabetes Brother        age 63  . Birth defects Brother   . Arthritis Maternal Grandmother   . Heart disease Maternal Grandmother   . Heart attack Paternal Grandmother   . Colon cancer Neg Hx   . Stomach cancer Neg Hx     Social History   Socioeconomic History  . Marital status: Married    Spouse name: Not on file  . Number of children: Not on file  . Years of education: Not on file  . Highest education level: Not on file  Occupational History    Employer: [employer].  Social Needs  . Financial resource strain: Not on file  . Food insecurity:    Worry: Not on file    Inability: Not on file  . Transportation needs:    Medical: Not on file    Non-medical: Not on file  Tobacco Use  . Smoking status: Never Smoker  . Smokeless tobacco: Never Used  Substance and Sexual Activity  . Alcohol use: Yes    Comment: maybe 2glasses a week  . Drug use: No  . Sexual activity: Not Currently    Partners: Male  Lifestyle  . Physical activity:    Days per week: Not on file    Minutes per session: Not on file  . Stress: Not on file  Relationships  . Social connections:    Talks on phone: Not on file    Gets together: Not on file    Attends religious service: Not on file    Active member of club or organization: Not on file    Attends meetings of clubs or organizations: Not on file    Relationship status: Not on file  . Intimate partner violence:    Fear of current or ex partner: Not on file    Emotionally abused: Not on file    Physically abused: Not on file    Forced sexual activity: Not on file  Other Topics Concern  . 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   Review of  Systems  Constitutional: Negative.   Respiratory: Negative.   Cardiovascular: Negative.   Gastrointestinal: Negative.   Genitourinary: Negative.   Musculoskeletal: Negative.   Neurological: Negative.   Hematological: Negative.   Psychiatric/Behavioral: Negative.   All other systems reviewed and are negative.      Objective:    BP (!) 138/92 (BP Location: Left Arm, Patient Position:  Sitting, Cuff Size: Normal)   Pulse 74   Temp 98.4 F (36.9 C) (Oral)   Ht 5\' 4"  (1.626 m)   Wt 160 lb 12.8 oz (72.9 kg)   SpO2 95%   BMI 27.60 kg/m    Physical Exam  Constitutional: She is oriented to person, place, and time. She appears well-developed and well-nourished. No distress.  HENT:  Head: Normocephalic and atraumatic.  Eyes: EOM are normal.  Neck: Normal range of motion.  Cardiovascular: Normal rate.  Pulmonary/Chest: Effort normal.  Musculoskeletal: Normal range of motion.  Neurological: She is alert and oriented to person, place, and time. No cranial nerve deficit.  Skin: Skin is warm and dry. She is not diaphoretic.  Psychiatric: She has a normal mood and affect. Her behavior is normal. Judgment and thought content normal.  Nursing note and vitals reviewed.          Assessment & Plan:   Spasmodic dysphonia  Allergic rhinitis, unspecified seasonality, unspecified trigger  Gastroesophageal reflux disease, esophagitis presence not specified  Need for pneumococcal vaccination - Plan: Pneumococcal conjugate vaccine 13-valent  Need for hepatitis C screening test - Plan: Hepatitis C Antibody No follow-ups on file.

## 2017-10-12 LAB — HEPATITIS C ANTIBODY
Hepatitis C Ab: NONREACTIVE
SIGNAL TO CUT-OFF: 0.01 (ref <1.00)

## 2017-10-22 DIAGNOSIS — R498 Other voice and resonance disorders: Secondary | ICD-10-CM | POA: Diagnosis not present

## 2017-10-22 DIAGNOSIS — J385 Laryngeal spasm: Secondary | ICD-10-CM | POA: Diagnosis not present

## 2017-10-29 DIAGNOSIS — R490 Dysphonia: Secondary | ICD-10-CM | POA: Diagnosis not present

## 2017-10-29 DIAGNOSIS — J383 Other diseases of vocal cords: Secondary | ICD-10-CM | POA: Diagnosis not present

## 2017-11-05 ENCOUNTER — Telehealth: Payer: Self-pay | Admitting: Family Medicine

## 2017-11-05 NOTE — Telephone Encounter (Signed)
Copied from [place] [phone]. Topic: Quick Communication - See Telephone Encounter >> Nov 05, 2017 12:26 PM Vernona Rieger wrote: CRM for notification. See Telephone encounter for: 11/05/17.  Patient said that her prescription with her previous doctor has ended for venlafaxine XR (EFFEXOR-XR) 75 MG 24 hr capsule. She is needing a new prescription for this. She states that her and Dr Deborra Medina have talked about this before. She would like that sent to [facility] # [address] [place] [address] [place] [address]

## 2017-11-06 MED ORDER — VENLAFAXINE HCL ER 75 MG PO CP24: 75.0000 mg | ORAL_CAPSULE | Freq: Every day | ORAL | 1 refills | Status: DC

## 2017-11-06 NOTE — Telephone Encounter (Signed)
Sent in Rx for pt/thx dmf

## 2017-11-07 DIAGNOSIS — Z6827 Body mass index (BMI) 27.0-27.9, adult: Secondary | ICD-10-CM | POA: Diagnosis not present

## 2017-11-07 DIAGNOSIS — Z01419 Encounter for gynecological examination (general) (routine) without abnormal findings: Secondary | ICD-10-CM | POA: Diagnosis not present

## 2017-11-07 NOTE — Telephone Encounter (Signed)
Started PA for Venlafaxine with covermymeds. Waiting for approval   KEY: [facility]

## 2017-11-08 ENCOUNTER — Telehealth: Payer: Self-pay | Admitting: Family Medicine

## 2017-11-09 NOTE — Telephone Encounter (Signed)
Prior authorization denied for Venlafaxine 37.5 mg pt is taking 75 mg .

## 2017-11-09 NOTE — Telephone Encounter (Signed)
Venlafaxine 37.5 mg has been appealed and approved.

## 2017-12-05 ENCOUNTER — Other Ambulatory Visit (INDEPENDENT_AMBULATORY_CARE_PROVIDER_SITE_OTHER): Payer: Medicare Other

## 2017-12-05 ENCOUNTER — Other Ambulatory Visit: Payer: Self-pay

## 2017-12-05 ENCOUNTER — Ambulatory Visit: Payer: Medicare Other | Admitting: Family Medicine

## 2017-12-05 DIAGNOSIS — Z79890 Hormone replacement therapy (postmenopausal): Secondary | ICD-10-CM

## 2017-12-05 DIAGNOSIS — E785 Hyperlipidemia, unspecified: Secondary | ICD-10-CM

## 2017-12-05 LAB — COMPREHENSIVE METABOLIC PANEL
ALBUMIN: 4.2 g/dL (ref 3.5–5.2)
ALK PHOS: 65 U/L (ref 39–117)
ALT: 19 U/L (ref 0–35)
AST: 15 U/L (ref 0–37)
BILIRUBIN TOTAL: 0.6 mg/dL (ref 0.2–1.2)
BUN: 21 mg/dL (ref 6–23)
CO2: 28 mEq/L (ref 19–32)
Calcium: 9.4 mg/dL (ref 8.4–10.5)
Chloride: 104 mEq/L (ref 96–112)
Creatinine, Ser: 0.63 mg/dL (ref 0.40–1.20)
GFR: 98.26 mL/min (ref >60.00)
GLUCOSE: 119 mg/dL — AB (ref 70–99)
Potassium: 5.3 mEq/L — ABNORMAL HIGH (ref 3.5–5.1)
Sodium: 139 mEq/L (ref 135–145)
TOTAL PROTEIN: 7.1 g/dL (ref 6.0–8.3)

## 2017-12-05 LAB — LIPID PANEL
CHOLESTEROL: 277 mg/dL — AB (ref 0–200)
HDL: 88.4 mg/dL (ref >39.00)
LDL Cholesterol: 165 mg/dL — ABNORMAL HIGH (ref 0–99)
NONHDL: 188.99
Total CHOL/HDL Ratio: 3
Triglycerides: 122 mg/dL (ref 0.0–149.0)
VLDL: 24.4 mg/dL (ref 0.0–40.0)

## 2017-12-05 LAB — CBC WITH DIFFERENTIAL/PLATELET
Basophils Absolute: 0 10*3/uL (ref 0.0–0.1)
Basophils Relative: 0.3 % (ref 0.0–3.0)
EOS PCT: 1.9 % (ref 0.0–5.0)
Eosinophils Absolute: 0.2 10*3/uL (ref 0.0–0.7)
HEMATOCRIT: 41.6 % (ref 36.0–46.0)
HEMOGLOBIN: 14.1 g/dL (ref 12.0–15.0)
LYMPHS ABS: 1.5 10*3/uL (ref 0.7–4.0)
LYMPHS PCT: 19.5 % (ref 12.0–46.0)
MCHC: 34 g/dL (ref 30.0–36.0)
MCV: 92.1 fl (ref 78.0–100.0)
MONOS PCT: 6.5 % (ref 3.0–12.0)
Monocytes Absolute: 0.5 10*3/uL (ref 0.1–1.0)
NEUTROS PCT: 71.8 % (ref 43.0–77.0)
Neutro Abs: 5.7 10*3/uL (ref 1.4–7.7)
Platelets: 253 10*3/uL (ref 150.0–400.0)
RBC: 4.51 Mil/uL (ref 3.87–5.11)
RDW: 12.8 % (ref 11.5–15.5)
WBC: 7.9 10*3/uL (ref 4.0–10.5)

## 2017-12-06 ENCOUNTER — Telehealth: Payer: Self-pay

## 2017-12-06 DIAGNOSIS — R739 Hyperglycemia, unspecified: Secondary | ICD-10-CM

## 2017-12-06 DIAGNOSIS — E785 Hyperlipidemia, unspecified: Secondary | ICD-10-CM

## 2017-12-06 NOTE — Telephone Encounter (Signed)
-----   Message from Lucille Passy, MD sent at 12/05/2017  3:50 PM EDT ----- Please call pt- was she fasting for this sample?  Blood sugar and cholesterol are very high.  Her potassium is high as well- ? Cut back on foods higher in potassium such as melon, dried fruits, bananas. I would like to check a fasting lipid panel, CMET and a1c on Friday if possible- please schedule lab visit.

## 2017-12-18 ENCOUNTER — Other Ambulatory Visit (INDEPENDENT_AMBULATORY_CARE_PROVIDER_SITE_OTHER): Payer: Medicare Other

## 2017-12-18 DIAGNOSIS — R739 Hyperglycemia, unspecified: Secondary | ICD-10-CM | POA: Diagnosis not present

## 2017-12-18 DIAGNOSIS — E785 Hyperlipidemia, unspecified: Secondary | ICD-10-CM | POA: Diagnosis not present

## 2017-12-18 LAB — COMPREHENSIVE METABOLIC PANEL
ALBUMIN: 4.1 g/dL (ref 3.5–5.2)
ALT: 16 U/L (ref 0–35)
AST: 16 U/L (ref 0–37)
Alkaline Phosphatase: 65 U/L (ref 39–117)
BUN: 12 mg/dL (ref 6–23)
CALCIUM: 9.1 mg/dL (ref 8.4–10.5)
CHLORIDE: 102 meq/L (ref 96–112)
CO2: 23 mEq/L (ref 19–32)
Creatinine, Ser: 0.59 mg/dL (ref 0.40–1.20)
GFR: 105.97 mL/min (ref >60.00)
Glucose, Bld: 114 mg/dL — ABNORMAL HIGH (ref 70–99)
POTASSIUM: 4.4 meq/L (ref 3.5–5.1)
Sodium: 135 mEq/L (ref 135–145)
TOTAL PROTEIN: 6.9 g/dL (ref 6.0–8.3)
Total Bilirubin: 0.7 mg/dL (ref 0.2–1.2)

## 2017-12-18 LAB — LIPID PANEL
CHOL/HDL RATIO: 3
Cholesterol: 230 mg/dL — ABNORMAL HIGH (ref 0–200)
HDL: 78.3 mg/dL (ref >39.00)
LDL CALC: 127 mg/dL — AB (ref 0–99)
NonHDL: 151.22
TRIGLYCERIDES: 120 mg/dL (ref 0.0–149.0)
VLDL: 24 mg/dL (ref 0.0–40.0)

## 2017-12-18 LAB — HEMOGLOBIN A1C: Hgb A1c MFr Bld: 5.9 % (ref 4.6–6.5)

## 2017-12-18 NOTE — Progress Notes (Signed)
Subjective:   Tamara Pope is a 72 y.o. female who presents for an Initial Medicare Annual Wellness Visit.  Pt just returned from trip to [place] hiking and spas with her 2 daughters. Pt is leaving in 3 weeks to go to [place] for her husband's 75th birthday.  Review of Systems   No ROS.  Medicare Wellness Visit. Additional risk factors are reflected in the social history.   Sleep patterns: no issues Home Safety/Smoke Alarms: Feels safe in home. Smoke alarms in place. Lives in 3 story home. No issues with stairs.   Female:     Mammo- utd      Dexa scan-  Pt states she will schedule with Dr.Holland.      CCS- last 11/17/15     Objective:    Today's Vitals   12/19/17 0931  BP: 138/60  Pulse: 70  SpO2: 95%  Weight: 158 lb 12.8 oz (72 kg)  Height: 5\' 4"  (1.626 m)   Body mass index is 27.26 kg/m.  Advanced Directives 12/19/2017 04/19/2015 04/18/2013  Does Patient Have a Medical Advance Directive? Yes Yes Patient has advance directive, copy not in chart  Type of Advance Directive [place];Living will [place];Living will [place];Living will  Does patient want to make changes to medical advance directive? - No - Patient declined No  Copy of Healthcare Power of Attorney in Chart? No - copy requested No - copy requested Copy requested from family    Current Medications (verified) Outpatient Encounter Medications as of 12/19/2017  Medication Sig  . Biotin 5000 MCG TABS Take 1 tablet by mouth daily.  . Calcium Carb-Cholecalciferol (CALTRATE 600+D3) 600-800 MG-UNIT TABS Take 1 tablet by mouth 2 (two) times daily.  . cetirizine (ZYRTEC) 10 MG tablet Take 10 mg by mouth daily.  . Cholecalciferol (VITAMIN D3) 2000 units TABS Take 1 tablet by mouth daily.  Marland Kitchen estradiol (VIVELLE-DOT) 0.05 MG/24HR patch APPLY 1 PATCH EXTERNALY TO SKIN TWICE WEEKLY AS DIRECTED  . fluticasone (FLONASE) 50 MCG/ACT nasal spray Place 1 spray  into both nostrils daily as needed for allergies or rhinitis.  . Probiotic Product (ALIGN) 4 MG CAPS Take 1 capsule by mouth daily.  . progesterone (PROMETRIUM) 100 MG capsule Take 100 mg by mouth daily.  Marland Kitchen venlafaxine XR (EFFEXOR-XR) 75 MG 24 hr capsule Take 1 capsule (75 mg total) by mouth daily with breakfast.  . VITAMIN K PO Take 1 tablet by mouth daily.  . [DISCONTINUED] Menaquinone-7 (VITAMIN K2 PO) Take by mouth.   No facility-administered encounter medications on file as of 12/19/2017.     Allergies (verified) Patient has no known allergies.   History: Past Medical History:  Diagnosis Date  . Allergy   . Arthritis   . Dysphonia, spasmodic   . GERD (gastroesophageal reflux disease)   . History of DVT (deep vein thrombosis)    JAN 2002-- S/P BREAST IMPLANTS  RIGHT UPPER ARM (AXILLARY/ SUBCLAVIN VEIN)  . PMB (postmenopausal bleeding)    Past Surgical History:  Procedure Laterality Date  . APPENDECTOMY  AS  CHILD  . AUGMENTATION MAMMAPLASTY Bilateral    2016  . BILATERAL BREAST LIFT AND IMPLANT REDUCTION  SEPT 2014  . BREAST ENHANCEMENT SURGERY Bilateral NOV 2001  . CATARACT EXTRACTION W/ INTRAOCULAR LENS  IMPLANT, BILATERAL    . DILATATION & CURRETTAGE/HYSTEROSCOPY WITH RESECTOCOPE N/A 04/18/2013   Procedure: DILATATION & CURETTAGE/HYSTEROSCOPY ;  Surgeon: [name], MD;  Location: [facility];  Service: Gynecology;  Laterality: N/A;  . KNEE ARTHROSCOPY W/ MENISCECTOMY Right 2011  . ROTATOR CUFF REPAIR Right 2005  . TONSILLECTOMY  AS CHILD  . TOTAL KNEE ARTHROPLASTY Right 05/03/2015   Procedure: RIGHT TOTAL KNEE ARTHROPLASTY;  Surgeon: Vickey Huger, MD;  Location: [facility];  Service: Orthopedics;  Laterality: Right;   Family History  Problem Relation Age of Onset  . Arthritis Mother   . Lung cancer Father   . Diabetes Brother        age 39  . Birth defects Brother   . Arthritis Maternal Grandmother   . Heart disease Maternal Grandmother   . Heart  attack Paternal Grandmother   . Colon cancer Neg Hx   . Stomach cancer Neg Hx    Social History   Socioeconomic History  . Marital status: Married    Spouse name: Not on file  . Number of children: Not on file  . Years of education: Not on file  . Highest education level: Not on file  Occupational History    Employer: [employer].  Social Needs  . Financial resource strain: Not on file  . Food insecurity:    Worry: Not on file    Inability: Not on file  . Transportation needs:    Medical: Not on file    Non-medical: Not on file  Tobacco Use  . Smoking status: Never Smoker  . Smokeless tobacco: Never Used  Substance and Sexual Activity  . Alcohol use: Yes    Comment: maybe 2 glasses a week  . Drug use: No  . Sexual activity: Yes    Partners: Male  Lifestyle  . Physical activity:    Days per week: Not on file    Minutes per session: Not on file  . Stress: Not on file  Relationships  . Social connections:    Talks on phone: Not on file    Gets together: Not on file    Attends religious service: Not on file    Active member of club or organization: Not on file    Attends meetings of clubs or organizations: Not on file    Relationship status: Not on file  Other Topics Concern  . Not on file  Social History Narrative   Married   Orig from [place], [place]   Has been Event organiser of [employer]   At least 1 daughter   Second home [employer] - enjoys yoga    Tobacco Counseling Counseling given: Not Answered   Clinical Intake Pain : No/denies pain    Activities of Daily Living In your present state of health, do you have any difficulty performing the following activities: 12/19/2017 10/11/2017  Hearing? N N  Vision? N N  Difficulty concentrating or making decisions? N N  Walking or climbing stairs? N N  Dressing or bathing? N N  Doing errands, shopping? N N  Preparing Food and eating ? N -  Using the Toilet? N -  In the past six  months, have you accidently leaked urine? N -  Do you have problems with loss of bowel control? N -  Managing your Medications? N -  Managing your Finances? N -  Housekeeping or managing your Housekeeping? N -  Some recent data might be hidden     Immunizations and Health Maintenance Immunization History  Administered Date(s) Administered  . Influenza, High Dose Seasonal PF 01/14/2015  . Pneumococcal Conjugate-13 10/11/2017  . Tdap 11/03/2009   Health  Maintenance Due  Topic Date Due  . DEXA SCAN  03/19/2009  . INFLUENZA VACCINE  11/01/2017    Patient Care Team: Lucille Passy, MD as PCP - General (Family Medicine)  Indicate any recent Medical Services you may have received from other than [facility] providers in the past year (date may be approximate).     Assessment:   This is a routine wellness examination for Tamara Pope. Physical assessment deferred to PCP.  Hearing/Vision screen  Visual Acuity Screening   Right eye Left eye Both eyes  Without correction: 20/25 20/25 20/25   With correction:     Hearing Screening Comments: Able to hear conversational tones w/o difficulty. No issues reported.    Dietary issues and exercise activities discussed: Current Exercise Habits: Structured exercise class, Type of exercise: yoga;strength training/weights;walking, Time (Minutes): 60, Frequency (Times/Week): 4, Weekly Exercise (Minutes/Week): 240, Exercise limited by: None identified Diet (meal preparation, eat out, water intake, caffeinated beverages, dairy products, fruits and vegetables): in general, a "healthy" diet  , well balanced    Goals    . Weight (lb) < 148 lb (67.1 kg)     Continue to exercise and eat healthy.       Depression Screen PHQ 2/9 Scores 12/19/2017 10/11/2017  PHQ - 2 Score 0 0    Fall Risk Fall Risk  12/19/2017 10/11/2017  Falls in the past year? No No   Cognitive Function: Ad8 score reviewed for issues:  Issues making decisions:no  Less interest in hobbies  / activities:no  Repeats questions, stories (family complaining):no  Trouble using ordinary gadgets (microwave, computer, phone):no  Forgets the month or year: no  Mismanaging finances: no  Remembering appts:no  Daily problems with thinking and/or memory:no Ad8 score is=0        Screening Tests Health Maintenance  Topic Date Due  . DEXA SCAN  03/19/2009  . INFLUENZA VACCINE  11/01/2017  . PNA vac Low Risk Adult (2 of 2 - PPSV23) 10/12/2018  . MAMMOGRAM  07/03/2019  . TETANUS/TDAP  11/04/2019  . COLONOSCOPY  11/16/2025  . Hepatitis C Screening  Completed       Plan:    Please schedule your next medicare wellness visit with me in 1 yr.  Follow up with Dr.Aron as scheduled.  Continue to eat heart healthy diet (full of fruits, vegetables, whole grains, lean protein, water--limit salt, fat, and sugar intake) and increase physical activity as tolerated.  Bring a copy of your living will and/or healthcare power of attorney to your next office visit.  Continue doing brain stimulating activities (puzzles, reading, adult coloring books, staying active) to keep memory sharp.     I have personally reviewed and noted the following in the patient's chart:   . Medical and social history . Use of alcohol, tobacco or illicit drugs  . Current medications and supplements . Functional ability and status . Nutritional status . Physical activity . Advanced directives . List of other physicians . Hospitalizations, surgeries, and ER visits in previous 12 months . Vitals . Screenings to include cognitive, depression, and falls . Referrals and appointments  In addition, I have reviewed and discussed with patient certain preventive protocols, quality metrics, and best practice recommendations. A written personalized care plan for preventive services as well as general preventive health recommendations were provided to patient.     Naaman Plummer [place], [place]   12/19/2017

## 2017-12-19 ENCOUNTER — Telehealth: Payer: Self-pay | Admitting: Family Medicine

## 2017-12-19 ENCOUNTER — Ambulatory Visit: Payer: Medicare Other | Admitting: Family Medicine

## 2017-12-19 ENCOUNTER — Encounter: Payer: Self-pay | Admitting: Behavioral Health

## 2017-12-19 ENCOUNTER — Ambulatory Visit (INDEPENDENT_AMBULATORY_CARE_PROVIDER_SITE_OTHER): Payer: Medicare Other | Admitting: Behavioral Health

## 2017-12-19 VITALS — BP 138/60 | HR 70 | Ht 64.0 in | Wt 158.8 lb

## 2017-12-19 DIAGNOSIS — Z0000 Encounter for general adult medical examination without abnormal findings: Secondary | ICD-10-CM

## 2017-12-19 NOTE — Telephone Encounter (Signed)
Patient came in today saying that she will be going out of the country and in inquiring about a medication (malaria pills). Patient would like to come by and pick up this RX instead of sending it to the pharmacy. Please do not send RX to pharmacy, patient wants a physical copy of RX. Patient will be leaving the country on Oct. 13, 2019.

## 2017-12-19 NOTE — Progress Notes (Signed)
Medical screening examination/treatment/procedure(s) were performed by the Wellness Coach, RN. As primary care provider I was immediately available for consulation/collaboration. I agree with above documentation. Charlotte Nche, AGNP-C 

## 2017-12-19 NOTE — Patient Instructions (Signed)
Please schedule your next medicare wellness visit with me in 1 yr.  Follow up with Dr.Aron as scheduled.  Continue to eat heart healthy diet (full of fruits, vegetables, whole grains, lean protein, water--limit salt, fat, and sugar intake) and increase physical activity as tolerated.  Bring a copy of your living will and/or healthcare power of attorney to your next office visit.  Continue doing brain stimulating activities (puzzles, reading, adult coloring books, staying active) to keep memory sharp.    Tamara Pope , Thank you for taking time to come for your Medicare Wellness Visit. I appreciate your ongoing commitment to your health goals. Please review the following plan we discussed and let me know if I can assist you in the future.   These are the goals we discussed: Goals    . Weight (lb) < 148 lb (67.1 kg)     Continue to exercise and eat healthy.        This is a list of the screening recommended for you and due dates:  Health Maintenance  Topic Date Due  . DEXA scan (bone density measurement)  03/19/2009  . Flu Shot  11/01/2017  . Pneumonia vaccines (2 of 2 - PPSV23) 10/12/2018  . Mammogram  07/03/2019  . Tetanus Vaccine  11/04/2019  . Colon Cancer Screening  11/16/2025  .  Hepatitis C: One time screening is recommended by Center for Disease Control  (CDC) for  adults born from [birth year] through 1967.   Completed    Health Maintenance for Postmenopausal Women Menopause is a normal process in which your reproductive ability comes to an end. This process happens gradually over a span of months to years, usually between the ages of 21 and 28. Menopause is complete when you have missed 12 consecutive menstrual periods. It is important to talk with your health care provider about some of the most common conditions that affect postmenopausal women, such as heart disease, cancer, and bone loss (osteoporosis). Adopting a healthy lifestyle and getting preventive care can help to  promote your health and wellness. Those actions can also lower your chances of developing some of these common conditions. What should I know about menopause? During menopause, you may experience a number of symptoms, such as:  Moderate-to-severe hot flashes.  Night sweats.  Decrease in sex drive.  Mood swings.  Headaches.  Tiredness.  Irritability.  Memory problems.  Insomnia.  Choosing to treat or not to treat menopausal changes is an individual decision that you make with your health care provider. What should I know about hormone replacement therapy and supplements? Hormone therapy products are effective for treating symptoms that are associated with menopause, such as hot flashes and night sweats. Hormone replacement carries certain risks, especially as you become older. If you are thinking about using estrogen or estrogen with progestin treatments, discuss the benefits and risks with your health care provider. What should I know about heart disease and stroke? Heart disease, heart attack, and stroke become more likely as you age. This may be due, in part, to the hormonal changes that your body experiences during menopause. These can affect how your body processes dietary fats, triglycerides, and cholesterol. Heart attack and stroke are both medical emergencies. There are many things that you can do to help prevent heart disease and stroke:  Have your blood pressure checked at least every 1-2 years. High blood pressure causes heart disease and increases the risk of stroke.  If you are 14-81 years old, ask your health  care provider if you should take aspirin to prevent a heart attack or a stroke.  Do not use any tobacco products, including cigarettes, chewing tobacco, or electronic cigarettes. If you need help quitting, ask your health care provider.  It is important to eat a healthy diet and maintain a healthy weight. ? Be sure to include plenty of vegetables, fruits, low-fat  dairy products, and lean protein. ? Avoid eating foods that are high in solid fats, added sugars, or salt (sodium).  Get regular exercise. This is one of the most important things that you can do for your health. ? Try to exercise for at least 150 minutes each week. The type of exercise that you do should increase your heart rate and make you sweat. This is known as moderate-intensity exercise. ? Try to do strengthening exercises at least twice each week. Do these in addition to the moderate-intensity exercise.  Know your numbers.Ask your health care provider to check your cholesterol and your blood glucose. Continue to have your blood tested as directed by your health care provider.  What should I know about cancer screening? There are several types of cancer. Take the following steps to reduce your risk and to catch any cancer development as early as possible. Breast Cancer  Practice breast self-awareness. ? This means understanding how your breasts normally appear and feel. ? It also means doing regular breast self-exams. Let your health care provider know about any changes, no matter how small.  If you are 28 or older, have a clinician do a breast exam (clinical breast exam or CBE) every year. Depending on your age, family history, and medical history, it may be recommended that you also have a yearly breast X-ray (mammogram).  If you have a family history of breast cancer, talk with your health care provider about genetic screening.  If you are at high risk for breast cancer, talk with your health care provider about having an MRI and a mammogram every year.  Breast cancer (BRCA) gene test is recommended for women who have family members with BRCA-related cancers. Results of the assessment will determine the need for genetic counseling and BRCA1 and for BRCA2 testing. BRCA-related cancers include these types: ? Breast. This occurs in males or females. ? Ovarian. ? Tubal. This may also  be called fallopian tube cancer. ? Cancer of the abdominal or pelvic lining (peritoneal cancer). ? Prostate. ? Pancreatic.  Cervical, Uterine, and Ovarian Cancer Your health care provider may recommend that you be screened regularly for cancer of the pelvic organs. These include your ovaries, uterus, and vagina. This screening involves a pelvic exam, which includes checking for microscopic changes to the surface of your cervix (Pap test).  For women ages 21-65, health care providers may recommend a pelvic exam and a Pap test every three years. For women ages 36-65, they may recommend the Pap test and pelvic exam, combined with testing for human papilloma virus (HPV), every five years. Some types of HPV increase your risk of cervical cancer. Testing for HPV may also be done on women of any age who have unclear Pap test results.  Other health care providers may not recommend any screening for nonpregnant women who are considered low risk for pelvic cancer and have no symptoms. Ask your health care provider if a screening pelvic exam is right for you.  If you have had past treatment for cervical cancer or a condition that could lead to cancer, you need Pap tests and screening  for cancer for at least 20 years after your treatment. If Pap tests have been discontinued for you, your risk factors (such as having a new sexual partner) need to be reassessed to determine if you should start having screenings again. Some women have medical problems that increase the chance of getting cervical cancer. In these cases, your health care provider may recommend that you have screening and Pap tests more often.  If you have a family history of uterine cancer or ovarian cancer, talk with your health care provider about genetic screening.  If you have vaginal bleeding after reaching menopause, tell your health care provider.  There are currently no reliable tests available to screen for ovarian cancer.  Lung  Cancer Lung cancer screening is recommended for adults 46-69 years old who are at high risk for lung cancer because of a history of smoking. A yearly low-dose CT scan of the lungs is recommended if you:  Currently smoke.  Have a history of at least 30 pack-years of smoking and you currently smoke or have quit within the past 15 years. A pack-year is smoking an average of one pack of cigarettes per day for one year.  Yearly screening should:  Continue until it has been 15 years since you quit.  Stop if you develop a health problem that would prevent you from having lung cancer treatment.  Colorectal Cancer  This type of cancer can be detected and can often be prevented.  Routine colorectal cancer screening usually begins at age 68 and continues through age 37.  If you have risk factors for colon cancer, your health care provider may recommend that you be screened at an earlier age.  If you have a family history of colorectal cancer, talk with your health care provider about genetic screening.  Your health care provider may also recommend using home test kits to check for hidden blood in your stool.  A small camera at the end of a tube can be used to examine your colon directly (sigmoidoscopy or colonoscopy). This is done to check for the earliest forms of colorectal cancer.  Direct examination of the colon should be repeated every 5-10 years until age 47. However, if early forms of precancerous polyps or small growths are found or if you have a family history or genetic risk for colorectal cancer, you may need to be screened more often.  Skin Cancer  Check your skin from head to toe regularly.  Monitor any moles. Be sure to tell your health care provider: ? About any new moles or changes in moles, especially if there is a change in a mole's shape or color. ? If you have a mole that is larger than the size of a pencil eraser.  If any of your family members has a history of skin  cancer, especially at a young age, talk with your health care provider about genetic screening.  Always use sunscreen. Apply sunscreen liberally and repeatedly throughout the day.  Whenever you are outside, protect yourself by wearing long sleeves, pants, a wide-brimmed hat, and sunglasses.  What should I know about osteoporosis? Osteoporosis is a condition in which bone destruction happens more quickly than new bone creation. After menopause, you may be at an increased risk for osteoporosis. To help prevent osteoporosis or the bone fractures that can happen because of osteoporosis, the following is recommended:  If you are 35-93 years old, get at least 1,000 mg of calcium and at least 600 mg of vitamin D  per day.  If you are older than age 90 but younger than age 38, get at least 1,200 mg of calcium and at least 600 mg of vitamin D per day.  If you are older than age 59, get at least 1,200 mg of calcium and at least 800 mg of vitamin D per day.  Smoking and excessive alcohol intake increase the risk of osteoporosis. Eat foods that are rich in calcium and vitamin D, and do weight-bearing exercises several times each week as directed by your health care provider. What should I know about how menopause affects my mental health? Depression may occur at any age, but it is more common as you become older. Common symptoms of depression include:  Low or sad mood.  Changes in sleep patterns.  Changes in appetite or eating patterns.  Feeling an overall lack of motivation or enjoyment of activities that you previously enjoyed.  Frequent crying spells.  Talk with your health care provider if you think that you are experiencing depression. What should I know about immunizations? It is important that you get and maintain your immunizations. These include:  Tetanus, diphtheria, and pertussis (Tdap) booster vaccine.  Influenza every year before the flu season begins.  Pneumonia  vaccine.  Shingles vaccine.  Your health care provider may also recommend other immunizations. This information is not intended to replace advice given to you by your health care provider. Make sure you discuss any questions you have with your health care provider. Document Released: 05/12/2005 Document Revised: 10/08/2015 Document Reviewed: 12/22/2014 Elsevier Interactive Patient Education  2018 [employer].

## 2017-12-19 NOTE — Telephone Encounter (Signed)
Can you help with this please.

## 2017-12-20 ENCOUNTER — Other Ambulatory Visit: Payer: Self-pay | Admitting: Family Medicine

## 2017-12-20 ENCOUNTER — Telehealth: Payer: Self-pay | Admitting: Emergency Medicine

## 2017-12-20 MED ORDER — ATOVAQUONE-PROGUANIL HCL 250-100 MG PO TABS: 1.0000 | ORAL_TABLET | Freq: Every day | ORAL | 0 refills | Status: DC

## 2017-12-20 NOTE — Telephone Encounter (Signed)
Rx printed and pt informed it is ready for pick up

## 2017-12-20 NOTE — Telephone Encounter (Signed)
Rx for [employer].

## 2017-12-20 NOTE — Telephone Encounter (Signed)
Left a VM for patient regarding prescription for Malarone being ready for pick up at the front desk.

## 2017-12-24 ENCOUNTER — Telehealth: Payer: Self-pay | Admitting: Family Medicine

## 2017-12-24 MED ORDER — VENLAFAXINE HCL ER 75 MG PO CP24: 75.0000 mg | ORAL_CAPSULE | Freq: Every day | ORAL | 0 refills | Status: DC

## 2017-12-24 NOTE — Telephone Encounter (Signed)
Sent in Rx/LMOVM to pt that it is completed/thx dmf

## 2017-12-24 NOTE — Telephone Encounter (Signed)
Copied from [place] [phone]. Topic: Quick Communication - See Telephone Encounter >> Dec 24, 2017  9:16 AM Blase Mess A wrote: CRM for notification. See Telephone encounter for: 12/24/17. Patient is on vacation and has left her medication at home.  At this time she is requesting 5 pills of venlafaxine XR (EFFEXOR-XR) 75 MG 24 hr capsule [[id number]]  Please advise Preferred Pharmacy [facility], [place], [place] [id number] [phone]  Patients call back number [phone]

## 2017-12-26 ENCOUNTER — Encounter: Payer: Medicare Other | Admitting: Family Medicine

## 2018-01-02 DIAGNOSIS — Z85828 Personal history of other malignant neoplasm of skin: Secondary | ICD-10-CM | POA: Diagnosis not present

## 2018-01-02 DIAGNOSIS — L821 Other seborrheic keratosis: Secondary | ICD-10-CM | POA: Diagnosis not present

## 2018-01-02 DIAGNOSIS — L820 Inflamed seborrheic keratosis: Secondary | ICD-10-CM | POA: Diagnosis not present

## 2018-01-03 ENCOUNTER — Ambulatory Visit (INDEPENDENT_AMBULATORY_CARE_PROVIDER_SITE_OTHER): Payer: Medicare Other | Admitting: Family Medicine

## 2018-01-03 ENCOUNTER — Encounter: Payer: Self-pay | Admitting: Family Medicine

## 2018-01-03 VITALS — BP 130/80 | HR 81 | Temp 98.2°F | Ht 64.0 in | Wt 157.0 lb

## 2018-01-03 DIAGNOSIS — R739 Hyperglycemia, unspecified: Secondary | ICD-10-CM

## 2018-01-03 DIAGNOSIS — J309 Allergic rhinitis, unspecified: Secondary | ICD-10-CM | POA: Diagnosis not present

## 2018-01-03 DIAGNOSIS — E785 Hyperlipidemia, unspecified: Secondary | ICD-10-CM | POA: Diagnosis not present

## 2018-01-03 DIAGNOSIS — F411 Generalized anxiety disorder: Secondary | ICD-10-CM

## 2018-01-03 DIAGNOSIS — Z23 Encounter for immunization: Secondary | ICD-10-CM

## 2018-01-03 DIAGNOSIS — J385 Laryngeal spasm: Secondary | ICD-10-CM | POA: Diagnosis not present

## 2018-01-03 DIAGNOSIS — K219 Gastro-esophageal reflux disease without esophagitis: Secondary | ICD-10-CM

## 2018-01-03 HISTORY — DX: Hyperlipidemia, unspecified: E78.5

## 2018-01-03 NOTE — Patient Instructions (Signed)
Great to see you. Have a great trip!!   Take a baby aspirin (81 mg daily)- the day of the flights.

## 2018-01-03 NOTE — Assessment & Plan Note (Signed)
a1c was 5.9. Given copy of Eat right Diet- she feels there is room in her diet to improve. Follow up in 6-12 months.

## 2018-01-03 NOTE — Assessment & Plan Note (Signed)
Asymptomatic without PPI.

## 2018-01-03 NOTE — Progress Notes (Signed)
Subjective:   Patient ID: Tamara Pope, female    DOB: [birth year], 72 y.o.   MRN: [id number]  Scheryl Sanborn is a pleasant 72 y.o. year old female who presents to clinic today with Follow-up (Dexa scan scheduled for Nov.)  on 01/03/2018  HPI:  Had medicare wellness visit with Glenard Haring, RN on 12/19/17.  Note reviewed.  Spasmodic dysphonia- has been followed at [facility] for this . Receives botox injections for it- currently has been 4 months since her last injection. Being followed at [facility]. Was taking Nexium 40 mg daily for possible GERD component of her symptoms. She stopped taking it months ago and feels asymptomatic without it.  GAD- just decreased effexor to 75 mg daily earlier this year.  She feels this dose is working well. Denies any current symptoms of anxiety or depression.  Vitamin D deficiency- currently taking 2000 IU of Vitamin D daily.  On HRT per GYN.  Hyperglycemia- Fasting glucose 117 in 12/2017.  a1c was 5.9. Lab Results  Component Value Date   HGBA1C 5.9 12/18/2017   HLD- LDL was very highon 9/4 but she was not fasting.  Improved diet and returned fasting on 12/18/17 and lipids were improved dramatically. Lab Results  Component Value Date   CHOL 230 (H) 12/18/2017   HDL 78.30 12/18/2017   LDLCALC 127 (H) 12/18/2017   TRIG 120.0 12/18/2017   CHOLHDL 3 12/18/2017     Lab Results  Component Value Date   CHOL 230 (H) 12/18/2017   HDL 78.30 12/18/2017   LDLCALC 127 (H) 12/18/2017   TRIG 120.0 12/18/2017   CHOLHDL 3 12/18/2017   Lab Results  Component Value Date   NA 135 12/18/2017   K 4.4 12/18/2017   CL 102 12/18/2017   CO2 23 12/18/2017   Lab Results  Component Value Date   WBC 7.9 12/05/2017   HGB 14.1 12/05/2017   HCT 41.6 12/05/2017   MCV 92.1 12/05/2017   PLT 253.0 12/05/2017   No results found for: TSH  Current Outpatient Medications on File Prior to Visit  Medication Sig Dispense Refill  . atovaquone-proguanil (MALARONE)  250-100 MG TABS tablet Take 1 tablet by mouth daily. Begin 48 hours before trip and continue 1 week upon return. 120 tablet 0  . Biotin 5000 MCG TABS Take 1 tablet by mouth daily.    . Calcium Carb-Cholecalciferol (CALTRATE 600+D3) 600-800 MG-UNIT TABS Take 1 tablet by mouth 2 (two) times daily.    . cetirizine (ZYRTEC) 10 MG tablet Take 10 mg by mouth daily.    . Cholecalciferol (VITAMIN D3) 2000 units TABS Take 1 tablet by mouth daily.    Marland Kitchen estradiol (VIVELLE-DOT) 0.05 MG/24HR patch APPLY 1 PATCH EXTERNALY TO SKIN TWICE WEEKLY AS DIRECTED  0  . fluticasone (FLONASE) 50 MCG/ACT nasal spray Place 1 spray into both nostrils daily as needed for allergies or rhinitis.    . Probiotic Product (ALIGN) 4 MG CAPS Take 1 capsule by mouth daily.    . progesterone (PROMETRIUM) 100 MG capsule Take 100 mg by mouth daily.    Marland Kitchen venlafaxine XR (EFFEXOR-XR) 75 MG 24 hr capsule Take 1 capsule (75 mg total) by mouth daily with breakfast. 90 capsule 0  . VITAMIN K PO Take 1 tablet by mouth daily.     No current facility-administered medications on file prior to visit.     No Known Allergies  Past Medical History:  Diagnosis Date  . Allergy   . Arthritis   .  Dysphonia, spasmodic   . GERD (gastroesophageal reflux disease)   . History of DVT (deep vein thrombosis)    JAN 2002-- S/P BREAST IMPLANTS  RIGHT UPPER ARM (AXILLARY/ SUBCLAVIN VEIN)  . PMB (postmenopausal bleeding)     Past Surgical History:  Procedure Laterality Date  . APPENDECTOMY  AS  CHILD  . AUGMENTATION MAMMAPLASTY Bilateral    2016  . BILATERAL BREAST LIFT AND IMPLANT REDUCTION  SEPT 2014  . BREAST ENHANCEMENT SURGERY Bilateral NOV 2001  . CATARACT EXTRACTION W/ INTRAOCULAR LENS  IMPLANT, BILATERAL    . DILATATION & CURRETTAGE/HYSTEROSCOPY WITH RESECTOCOPE N/A 04/18/2013   Procedure: DILATATION & CURETTAGE/HYSTEROSCOPY ;  Surgeon: [name], MD;  Location: [facility];  Service: Gynecology;  Laterality: N/A;  .  KNEE ARTHROSCOPY W/ MENISCECTOMY Right 2011  . ROTATOR CUFF REPAIR Right 2005  . TONSILLECTOMY  AS CHILD  . TOTAL KNEE ARTHROPLASTY Right 05/03/2015   Procedure: RIGHT TOTAL KNEE ARTHROPLASTY;  Surgeon: Vickey Huger, MD;  Location: [facility];  Service: Orthopedics;  Laterality: Right;    Family History  Problem Relation Age of Onset  . Arthritis Mother   . Lung cancer Father   . Diabetes Brother        age 65  . Birth defects Brother   . Arthritis Maternal Grandmother   . Heart disease Maternal Grandmother   . Heart attack Paternal Grandmother   . Colon cancer Neg Hx   . Stomach cancer Neg Hx     Social History   Socioeconomic History  . Marital status: Married    Spouse name: Not on file  . Number of children: Not on file  . Years of education: Not on file  . Highest education level: Not on file  Occupational History    Employer: [employer].  Social Needs  . Financial resource strain: Not on file  . Food insecurity:    Worry: Not on file    Inability: Not on file  . Transportation needs:    Medical: Not on file    Non-medical: Not on file  Tobacco Use  . Smoking status: Never Smoker  . Smokeless tobacco: Never Used  Substance and Sexual Activity  . Alcohol use: Yes    Comment: maybe 2 glasses a week  . Drug use: No  . Sexual activity: Yes    Partners: Male  Lifestyle  . Physical activity:    Days per week: Not on file    Minutes per session: Not on file  . Stress: Not on file  Relationships  . Social connections:    Talks on phone: Not on file    Gets together: Not on file    Attends religious service: Not on file    Active member of club or organization: Not on file    Attends meetings of clubs or organizations: Not on file    Relationship status: Not on file  . Intimate partner violence:    Fear of current or ex partner: Not on file    Emotionally abused: Not on file    Physically abused: Not on file    Forced sexual activity: Not on file    Other Topics Concern  . Not on file  Social History Narrative   Married   Orig from [place], [place]   Has been Paediatric nurse   At least 1 daughter   Second home [employer] - enjoys yoga   The PMH, [place], Social History, Family  History, Medications, and allergies have been reviewed in [facility], and have been updated if relevant.  Review of Systems  Constitutional: Negative.   HENT: Negative.  Negative for congestion.   Respiratory: Negative.   Cardiovascular: Negative.   Gastrointestinal: Negative.   Endocrine: Negative.   Genitourinary: Negative.   Musculoskeletal: Negative.   Allergic/Immunologic: Negative.   Neurological: Negative.   Hematological: Negative.   Psychiatric/Behavioral: Negative.   All other systems reviewed and are negative.      Objective:    BP 130/80 (BP Location: Right Arm, Patient Position: Sitting, Cuff Size: Normal)   Pulse 81   Temp 98.2 F (36.8 C) (Oral)   Ht 5\' 4"  (1.626 m)   Wt 157 lb (71.2 kg)   SpO2 95%   BMI 26.95 kg/m    Physical Exam    General:  Well-developed,well-nourished,in no acute distress; alert,appropriate and cooperative throughout examination Head:  normocephalic and atraumatic.   Eyes:  vision grossly intact, PERRL Ears:  R ear normal and L ear normal externally, TMs clear bilaterally Nose:  no external deformity.   Mouth:  good dentition.   Neck:  No deformities, masses, or tenderness noted. Lungs:  Normal respiratory effort, chest expands symmetrically. Lungs are clear to auscultation, no crackles or wheezes. Heart:  Normal rate and regular rhythm. S1 and S2 normal without gallop, murmur, click, rub or other extra sounds. Abdomen:  Bowel sounds positiv: performede,abdomen soft and non-tender without masses, organomegaly or hernias  Msk:  No deformity or scoliosis noted of thoracic or lumbar spine.   Extremities:  No clubbing, cyanosis, edema, or deformity noted with normal full range of  motion of all joints.   Neurologic:  alert & oriented X3 and gait normal.   Skin:  Intact without suspicious lesions or rashes Psych:  Cognition and judgment appear intact. Alert and cooperative with normal attention span and concentration. No apparent delusions, illusions, hallucinations     Assessment & Plan:   Gastroesophageal reflux disease, esophagitis presence not specified  GAD (generalized anxiety disorder)  Allergic rhinitis, unspecified seasonality, unspecified trigger  Hyperlipidemia, unspecified hyperlipidemia type  Need for influenza vaccination - Plan: Flu vaccine HIGH DOSE PF (Fluzone High dose) No follow-ups on file.

## 2018-01-03 NOTE — Assessment & Plan Note (Signed)
Well controlled on current dose of Effexor. No changes made to rx today.

## 2018-01-03 NOTE — Assessment & Plan Note (Signed)
Followed by [facility], receiving Botox injections less frequently and doing well. Has stopped taking Nexium.

## 2018-01-03 NOTE — Assessment & Plan Note (Signed)
Improved with diet. Advised to continue working on diet and exercise.

## 2018-02-02 DIAGNOSIS — Z85828 Personal history of other malignant neoplasm of skin: Secondary | ICD-10-CM | POA: Diagnosis not present

## 2018-02-02 DIAGNOSIS — R21 Rash and other nonspecific skin eruption: Secondary | ICD-10-CM | POA: Diagnosis not present

## 2018-02-02 DIAGNOSIS — L03115 Cellulitis of right lower limb: Secondary | ICD-10-CM | POA: Diagnosis not present

## 2018-02-04 DIAGNOSIS — L011 Impetiginization of other dermatoses: Secondary | ICD-10-CM | POA: Diagnosis not present

## 2018-02-04 DIAGNOSIS — L0101 Non-bullous impetigo: Secondary | ICD-10-CM | POA: Diagnosis not present

## 2018-02-04 DIAGNOSIS — L986 Other infiltrative disorders of the skin and subcutaneous tissue: Secondary | ICD-10-CM | POA: Diagnosis not present

## 2018-02-04 DIAGNOSIS — D485 Neoplasm of uncertain behavior of skin: Secondary | ICD-10-CM | POA: Diagnosis not present

## 2018-02-04 DIAGNOSIS — L309 Dermatitis, unspecified: Secondary | ICD-10-CM | POA: Diagnosis not present

## 2018-02-11 ENCOUNTER — Other Ambulatory Visit: Payer: Self-pay | Admitting: Family Medicine

## 2018-02-11 NOTE — Telephone Encounter (Signed)
Peter Garter with [facility] calling to explain the refill request. States taht the last prescription [facility] and [facility] had the 75mg  on back order so it was filled as 37.5 taken 2x a day. States that [facility] does have the 75mg  in stock. Please advise

## 2018-02-18 DIAGNOSIS — N958 Other specified menopausal and perimenopausal disorders: Secondary | ICD-10-CM | POA: Diagnosis not present

## 2018-02-18 DIAGNOSIS — M8588 Other specified disorders of bone density and structure, other site: Secondary | ICD-10-CM | POA: Diagnosis not present

## 2018-02-27 DIAGNOSIS — L239 Allergic contact dermatitis, unspecified cause: Secondary | ICD-10-CM | POA: Diagnosis not present

## 2018-03-11 DIAGNOSIS — L718 Other rosacea: Secondary | ICD-10-CM | POA: Diagnosis not present

## 2018-04-22 ENCOUNTER — Telehealth: Payer: Self-pay

## 2018-04-22 NOTE — Telephone Encounter (Signed)
Patient has OV scheduled for 8:40am on 1.21.20 for infected toe/I left her a message asking for her to RTC/I need to know if there was any injury, if this is more of a joint or bone issue/if it is then she may need to see Dr. Raeford Razor instead/thx dmf

## 2018-04-22 NOTE — Telephone Encounter (Signed)
Error sorry should've went to [facility]

## 2018-04-22 NOTE — Telephone Encounter (Signed)
t calling back stating that its not a bone or joint issue that she banged it on something and blood or fungus under the nail she was out of town and don't recall hitting it on anything just just remember it was sore but she was on a ship for 2 weeks

## 2018-04-23 ENCOUNTER — Ambulatory Visit (INDEPENDENT_AMBULATORY_CARE_PROVIDER_SITE_OTHER): Payer: Medicare Other | Admitting: Family Medicine

## 2018-04-23 ENCOUNTER — Encounter: Payer: Self-pay | Admitting: Family Medicine

## 2018-04-23 VITALS — BP 100/60 | HR 82 | Temp 98.4°F | Ht 64.0 in | Wt 158.6 lb

## 2018-04-23 DIAGNOSIS — S90211A Contusion of right great toe with damage to nail, initial encounter: Secondary | ICD-10-CM | POA: Diagnosis not present

## 2018-04-23 NOTE — Progress Notes (Signed)
Subjective:   Patient ID: Tamara Pope, female    DOB: 1945-06-05, 73 y.o.   MRN: [id number]  Tamara Pope is a pleasant 73 y.o. year old female who presents to clinic today with Toe Pain (Patient is here today C/O toe issue.  She states that while out of town she banged her toe and there is blood under toenail.  She states that she is unsure if that is the reason her toenail is the way it is.  She cut her nail down till it bled.  Denies any pain.  Cleaned with H2O2.  Wonders if it is fungal as well.  She just got back from the [place] on a  2 week cruise. )  on 04/23/2018  HPI:  Toe injury- banged her toe while she was on a two weeks cruise to the [place] and wore uncomfortable shoes and now has dark blood under her toe nail.  Was initially painful but no longer painful.  Now she is just concerned about the discoloration.  The nail is thicker but she reports that it has been thicker for years.  No redness or warmth around the nail bed or skin of the toe.   No pain with weight bearing.  Current Outpatient Medications on File Prior to Visit  Medication Sig Dispense Refill  . Biotin 5000 MCG TABS Take 1 tablet by mouth daily.    . Calcium Carb-Cholecalciferol (CALTRATE 600+D3) 600-800 MG-UNIT TABS Take 1 tablet by mouth 2 (two) times daily.    . cetirizine (ZYRTEC) 10 MG tablet Take 10 mg by mouth daily.    . Cholecalciferol (VITAMIN D3) 2000 units TABS Take 1 tablet by mouth daily.    Marland Kitchen estradiol (VIVELLE-DOT) 0.05 MG/24HR patch APPLY 1 PATCH EXTERNALY TO SKIN TWICE WEEKLY AS DIRECTED  0  . fluticasone (FLONASE) 50 MCG/ACT nasal spray Place 1 spray into both nostrils daily as needed for allergies or rhinitis.    . Probiotic Product (ALIGN) 4 MG CAPS Take 1 capsule by mouth daily.    . progesterone (PROMETRIUM) 100 MG capsule Take 100 mg by mouth daily.    Marland Kitchen venlafaxine XR (EFFEXOR-XR) 75 MG 24 hr capsule TAKE 1 CAPSULE BY MOUTH EVERY DAY 30 capsule 3  . VITAMIN K PO  Take 1 tablet by mouth daily.     No current facility-administered medications on file prior to visit.     No Known Allergies  Past Medical History:  Diagnosis Date  . Allergy   . Arthritis   . Dysphonia, spasmodic   . GERD (gastroesophageal reflux disease)   . History of DVT (deep vein thrombosis)    JAN 2002-- S/P BREAST IMPLANTS  RIGHT UPPER ARM (AXILLARY/ SUBCLAVIN VEIN)  . PMB (postmenopausal bleeding)     Past Surgical History:  Procedure Laterality Date  . APPENDECTOMY  AS  CHILD  . AUGMENTATION MAMMAPLASTY Bilateral    2016  . BILATERAL BREAST LIFT AND IMPLANT REDUCTION  SEPT 2014  . BREAST ENHANCEMENT SURGERY Bilateral NOV 2001  . CATARACT EXTRACTION W/ INTRAOCULAR LENS  IMPLANT, BILATERAL    . DILATATION & CURRETTAGE/HYSTEROSCOPY WITH RESECTOCOPE N/A 04/18/2013   Procedure: DILATATION & CURETTAGE/HYSTEROSCOPY ;  Surgeon: [name], MD;  Location: [facility];  Service: Gynecology;  Laterality: N/A;  . KNEE ARTHROSCOPY W/ MENISCECTOMY Right 2011  . ROTATOR CUFF REPAIR Right 2005  . TONSILLECTOMY  AS CHILD  . TOTAL KNEE ARTHROPLASTY Right 05/03/2015   Procedure: RIGHT TOTAL KNEE ARTHROPLASTY;  Surgeon: Vickey Huger, MD;  Location: [place];  Service: Orthopedics;  Laterality: Right;    Family History  Problem Relation Age of Onset  . Arthritis Mother   . Lung cancer Father   . Diabetes Brother        age 72  . Birth defects Brother   . Arthritis Maternal Grandmother   . Heart disease Maternal Grandmother   . Heart attack Paternal Grandmother   . Colon cancer Neg Hx   . Stomach cancer Neg Hx     Social History   Socioeconomic History  . Marital status: Married    Spouse name: Not on file  . Number of children: Not on file  . Years of education: Not on file  . Highest education level: Not on file  Occupational History    Employer: [employer].  Social Needs  . Financial resource strain: Not on file  . Food insecurity:     Worry: Not on file    Inability: Not on file  . Transportation needs:    Medical: Not on file    Non-medical: Not on file  Tobacco Use  . Smoking status: Never Smoker  . Smokeless tobacco: Never Used  Substance and Sexual Activity  . Alcohol use: Yes    Comment: maybe 2 glasses a week  . Drug use: No  . Sexual activity: Yes    Partners: Male  Lifestyle  . Physical activity:    Days per week: Not on file    Minutes per session: Not on file  . Stress: Not on file  Relationships  . Social connections:    Talks on phone: Not on file    Gets together: Not on file    Attends religious service: Not on file    Active member of club or organization: Not on file    Attends meetings of clubs or organizations: Not on file    Relationship status: Not on file  . Intimate partner violence:    Fear of current or ex partner: Not on file    Emotionally abused: Not on file    Physically abused: Not on file    Forced sexual activity: Not on file  Other Topics Concern  . 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   Review of Systems  Constitutional: Negative.   HENT: Negative.   Eyes: Negative.   Respiratory: Negative.   Cardiovascular: Negative.   Gastrointestinal: Negative.   Endocrine: Negative.   Genitourinary: Negative.   Musculoskeletal: Negative.   Skin: Positive for color change.  Neurological: Negative.   Hematological: Negative.   Psychiatric/Behavioral: Negative.   All other systems reviewed and are negative.      Objective:    BP 100/60 (BP Location: Left Arm, Patient Position: Sitting, Cuff Size: Normal)   Pulse 82   Temp 98.4 F (36.9 C) (Oral)   Ht 5\' 4"  (1.626 m)   Wt 158 lb  9.6 oz (71.9 kg)   SpO2 94%   BMI 27.22 kg/m    Physical Exam Vitals signs and nursing note reviewed.  Constitutional:      General: She is not in acute distress.  Appearance: Normal appearance. She is normal weight.  HENT:     Head: Normocephalic.     Nose: Nose normal.     Mouth/Throat:     Mouth: Mucous membranes are moist.  Neck:     Musculoskeletal: Normal range of motion.  Cardiovascular:     Rate and Rhythm: Normal rate.     Pulses: Normal pulses.  Pulmonary:     Effort: Pulmonary effort is normal.  Musculoskeletal:       Feet:  Neurological:     General: No focal deficit present.     Mental Status: She is alert.  Psychiatric:        Mood and Affect: Mood normal.        Behavior: Behavior normal.        Thought Content: Thought content normal.        Judgment: Judgment normal.           Assessment & Plan:   Subungual hematoma of great toe of right foot, initial encounter No follow-ups on file.

## 2018-04-23 NOTE — Patient Instructions (Signed)
Subungual Hematoma A subungual hematoma is a collection of blood under a fingernail or toenail. It can cause pain and a blue area under the nail. What are the causes? This condition is caused by an injury to a finger or toe that breaks a blood vessel under the nail. It can result from:  A hard, direct hit to a finger or toe (crush injury).  Pressure being put on a finger or toe over and over again, such as pressure on a toe from running. What are the signs or symptoms?   A blue or dark blue color under the nail.  Pain or throbbing in the injured area. How is this treated? Treatment is often not needed for this condition. The pain often goes away in a few days, and the dark color under the nail will go away as the nail grows. If treatment is needed, your doctor may:  Do a procedure to drain the blood from under the nail. This may be done if you have a lot of pain or if a lot of blood collects under the nail.  Remove the nail. This may be done if there is a cut under the nail that needs stitches (sutures). Follow these instructions at home: Managing pain, stiffness, and swelling   If told, put ice on the area. ? Put ice in a plastic bag. ? Place a towel between your skin and the bag. ? Leave the ice on for 20 minutes, 2-3 times a day.  Raise (elevate) the injured finger or toe above the level of your heart while you are sitting or lying down. Doing this will help with pain and swelling. Injury care  Follow instructions from your doctor about how to take care of your injury. Make sure you: ? Change any bandage (dressing) as told by your doctor. ? Wash your hands with soap and water before you change your bandage. If you cannot use soap and water, use hand sanitizer. ? Leave stitches (sutures) in place. You may have these if your doctor fixed a cut under the nail. The stitches may need to stay in place for 2 weeks or longer.  If part of your nail falls off, gently trim the rest of  the nail. General instructions  Take over-the-counter and prescription medicines only as told by your doctor.  Return to your normal activities as told by your doctor. Ask your doctor what activities are safe for you.  Keep all follow-up visits as told by your doctor. This is important. Contact a doctor if you have:  Pain that is not helped by medicine.  A fever.  Redness, swelling, or pain around your nail. Get help right away if you have:  Fluid, blood, or pus coming from your nail. Summary  A subungual hematoma is a collection of blood under a fingernail or toenail.  It can cause pain and a blue area under the nail.  Treatment is often not needed for this condition.  Raise the injured finger or toe above the level of your heart while you are sitting or lying down. This information is not intended to replace advice given to you by your health care provider. Make sure you discuss any questions you have with your health care provider. Document Released: 06/12/2011 Document Revised: 08/23/2017 Document Reviewed: 08/23/2017 Elsevier Interactive Patient Education  2019 Elsevier Inc.  

## 2018-04-23 NOTE — Assessment & Plan Note (Signed)
New- reassurance provided. No indication of fracture or infection at this time. Supportive care as per AVS. Call or return to clinic prn if these symptoms worsen or fail to improve as anticipated. The patient indicates understanding of these issues and agrees with the plan.

## 2018-04-24 DIAGNOSIS — Z1231 Encounter for screening mammogram for malignant neoplasm of breast: Secondary | ICD-10-CM | POA: Diagnosis not present

## 2018-04-29 DIAGNOSIS — R498 Other voice and resonance disorders: Secondary | ICD-10-CM | POA: Diagnosis not present

## 2018-04-29 DIAGNOSIS — R490 Dysphonia: Secondary | ICD-10-CM | POA: Diagnosis not present

## 2018-04-29 DIAGNOSIS — J383 Other diseases of vocal cords: Secondary | ICD-10-CM | POA: Diagnosis not present

## 2018-05-08 DIAGNOSIS — L239 Allergic contact dermatitis, unspecified cause: Secondary | ICD-10-CM | POA: Diagnosis not present

## 2018-05-08 DIAGNOSIS — Z85828 Personal history of other malignant neoplasm of skin: Secondary | ICD-10-CM | POA: Diagnosis not present

## 2018-05-14 ENCOUNTER — Ambulatory Visit (INDEPENDENT_AMBULATORY_CARE_PROVIDER_SITE_OTHER): Payer: Medicare Other | Admitting: Family Medicine

## 2018-05-14 ENCOUNTER — Encounter: Payer: Self-pay | Admitting: Family Medicine

## 2018-05-14 VITALS — BP 120/70 | HR 98 | Temp 98.0°F | Ht 64.0 in | Wt 158.0 lb

## 2018-05-14 DIAGNOSIS — H0011 Chalazion right upper eyelid: Secondary | ICD-10-CM | POA: Diagnosis not present

## 2018-05-14 MED ORDER — DOXYCYCLINE HYCLATE 100 MG PO TABS: 100.0000 mg | ORAL_TABLET | Freq: Two times a day (BID) | ORAL | 0 refills | Status: DC

## 2018-05-14 NOTE — Patient Instructions (Signed)
Chalazion  A chalazion is a swelling or lump on the eyelid. It can affect the upper eyelid or the lower eyelid. What are the causes? This condition may be caused by:  Long-lasting (chronic) inflammation of the eyelid glands.  A blocked oil gland in the eyelid. What are the signs or symptoms? Symptoms of this condition include:  Swelling of the eyelid. The swelling may spread to areas around the eye.  A hard lump on the eyelid.  Blurry vision. The lump on the eyelid may make it hard to see out of the eye. How is this diagnosed? This condition is diagnosed with an examination of the eye. How is this treated? This condition is treated by applying a warm compress to the eyelid. If the condition does not improve, it may be treated with:  Medicine that is injected into the chalazion by a health care provider.  Surgery.  Medicine that is applied to the eye. Follow these instructions at home: Managing pain and swelling  Apply a warm, moist compress to the eyelid 4-6 times a day for 10-15 minutes at a time. This will help to open any blocked glands and to reduce redness and swelling.  Apply over-the-counter and prescription medicines only as told by your health care provider. General instructions  Do not touch the chalazion.  Do not try to remove the pus. Do not squeeze the chalazion or stick it with a pin or needle.  Do not rub your eyes.  Wash your hands often. Dry your hands with a clean towel.  Keep your face, scalp, and eyebrows clean.  Avoid wearing eye makeup.  If the chalazion does not break open (rupture) on its own, return to your health care provider.  Keep all follow-up appointments as told by your health care provider. This is important. Contact a health care provider if:  Your eyelid has not improved in 4 weeks.  Your eyelid is getting worse.  You have a fever.  The chalazion does not rupture on its own after a month of home treatment. Get help right  away if:  You have pain in your eye.  Your vision changes.  The chalazion becomes painful or red.  The chalazion gets bigger. Summary  A chalazion is a swelling or lump on the upper or lower eyelid.  It may be caused by chronic inflammation or a blocked oil gland.  Apply a warm, moist compress to the eyelid 4-6 times a day for 10-15 minutes at a time.  Keep your face, scalp, and eyebrows clean. This information is not intended to replace advice given to you by your health care provider. Make sure you discuss any questions you have with your health care provider. Document Released: 03/17/2000 Document Revised: 09/06/2017 Document Reviewed: 09/06/2017 Elsevier Interactive Patient Education  2019 Elsevier Inc.  

## 2018-05-14 NOTE — Progress Notes (Signed)
Established Patient Office Visit  Subjective:  Patient ID: Tamara Pope, female    DOB: April 22, 1945  Age: 73 y.o. MRN: [id number]  CC:  Chief Complaint  Patient presents with  . Stye    HPI [place] presents for evaluation and treatment of a tender swelling in her right upper eyelid.  This is been present for the last 3 to 4 days and seems to be worsening.  She had treated it with a warm compress and there was spontaneous drainage of purulent material.  She then had treated it with an ice pack.  There was no injury.  She does have a history of hordeolum involving the left lower lid.  She does admit to tenderness up underneath the eyelid.  Vision is not currently affected.  She is on the tail end of a URI with some lingering congestion.  Past Medical History:  Diagnosis Date  . Allergy   . Arthritis   . Dysphonia, spasmodic   . GERD (gastroesophageal reflux disease)   . History of DVT (deep vein thrombosis)    JAN 2002-- S/P BREAST IMPLANTS  RIGHT UPPER ARM (AXILLARY/ SUBCLAVIN VEIN)  . PMB (postmenopausal bleeding)     Past Surgical History:  Procedure Laterality Date  . APPENDECTOMY  AS  CHILD  . AUGMENTATION MAMMAPLASTY Bilateral    2016  . BILATERAL BREAST LIFT AND IMPLANT REDUCTION  SEPT 2014  . BREAST ENHANCEMENT SURGERY Bilateral NOV 2001  . CATARACT EXTRACTION W/ INTRAOCULAR LENS  IMPLANT, BILATERAL    . DILATATION & CURRETTAGE/HYSTEROSCOPY WITH RESECTOCOPE N/A 04/18/2013   Procedure: DILATATION & CURETTAGE/HYSTEROSCOPY ;  Surgeon: [name], MD;  Location: [facility];  Service: Gynecology;  Laterality: N/A;  . KNEE ARTHROSCOPY W/ MENISCECTOMY Right 2011  . ROTATOR CUFF REPAIR Right 2005  . TONSILLECTOMY  AS CHILD  . TOTAL KNEE ARTHROPLASTY Right 05/03/2015   Procedure: RIGHT TOTAL KNEE ARTHROPLASTY;  Surgeon: Vickey Huger, MD;  Location: [facility];  Service: Orthopedics;  Laterality: Right;    Family History  Problem Relation  Age of Onset  . Arthritis Mother   . Lung cancer Father   . Diabetes Brother        age 90  . Birth defects Brother   . Arthritis Maternal Grandmother   . Heart disease Maternal Grandmother   . Heart attack Paternal Grandmother   . Colon cancer Neg Hx   . Stomach cancer Neg Hx     Social History   Socioeconomic History  . Marital status: Married    Spouse name: Not on file  . Number of children: Not on file  . Years of education: Not on file  . Highest education level: Not on file  Occupational History    Employer: [employer]  Social Needs  . Financial resource strain: Not on file  . Food insecurity:    Worry: Not on file    Inability: Not on file  . Transportation needs:    Medical: Not on file    Non-medical: Not on file  Tobacco Use  . Smoking status: Never Smoker  . Smokeless tobacco: Never Used  Substance and Sexual Activity  . Alcohol use: Yes    Comment: maybe 2 glasses a week  . Drug use: No  . Sexual activity: Yes    Partners: Male  Lifestyle  . Physical activity:    Days per week: Not on file    Minutes per session: Not on file  .  Stress: Not on file  Relationships  . Social connections:    Talks on phone: Not on file    Gets together: Not on file    Attends religious service: Not on file    Active member of club or organization: Not on file    Attends meetings of clubs or organizations: Not on file    Relationship status: Not on file  . Intimate partner violence:    Fear of current or ex partner: Not on file    Emotionally abused: Not on file    Physically abused: Not on file    Forced sexual activity: Not on file  Other Topics Concern  . Not on file  Social History Narrative   Married   Orig from [place], [place]   Has been Paediatric nurse   At least 1 daughter   Second home [employer] - enjoys yoga    Outpatient Medications Prior to Visit  Medication Sig Dispense Refill  . Biotin 5000 MCG TABS  Take 1 tablet by mouth daily.    . Calcium Carb-Cholecalciferol (CALTRATE 600+D3) 600-800 MG-UNIT TABS Take 1 tablet by mouth 2 (two) times daily.    . cetirizine (ZYRTEC) 10 MG tablet Take 10 mg by mouth daily.    . Cholecalciferol (VITAMIN D3) 2000 units TABS Take 1 tablet by mouth daily.    Marland Kitchen estradiol (VIVELLE-DOT) 0.05 MG/24HR patch APPLY 1 PATCH EXTERNALY TO SKIN TWICE WEEKLY AS DIRECTED  0  . fluticasone (FLONASE) 50 MCG/ACT nasal spray Place 1 spray into both nostrils daily as needed for allergies or rhinitis.    . Probiotic Product (ALIGN) 4 MG CAPS Take 1 capsule by mouth daily.    . progesterone (PROMETRIUM) 100 MG capsule Take 100 mg by mouth daily.    Marland Kitchen venlafaxine XR (EFFEXOR-XR) 75 MG 24 hr capsule TAKE 1 CAPSULE BY MOUTH EVERY DAY 30 capsule 3  . VITAMIN K PO Take 1 tablet by mouth daily.     No facility-administered medications prior to visit.     No Known Allergies  ROS Review of Systems  Constitutional: Negative.   HENT: Positive for congestion. Negative for rhinorrhea, sinus pressure and sinus pain.   Eyes: Positive for discharge. Negative for photophobia and visual disturbance.  Respiratory: Negative.   Cardiovascular: Negative.   Gastrointestinal: Negative.   Genitourinary: Negative.   Musculoskeletal: Negative for arthralgias and myalgias.  Skin: Negative.   Allergic/Immunologic: Negative for immunocompromised state.  Neurological: Negative for light-headedness and headaches.  Hematological: Does not bruise/bleed easily.  Psychiatric/Behavioral: Negative.       Objective:    Physical Exam  Constitutional: She is oriented to person, place, and time. She appears well-developed and well-nourished. No distress.  HENT:  Head: Normocephalic and atraumatic.  Right Ear: External ear normal.  Left Ear: External ear normal.  Mouth/Throat: Oropharynx is clear and moist. No oropharyngeal exudate.  Eyes: Pupils are equal, round, and reactive to light. Conjunctivae  are normal. Right eye exhibits no discharge. Left eye exhibits no discharge. No scleral icterus.    Neck: Neck supple. No JVD present. No tracheal deviation present. No thyromegaly present.  Pulmonary/Chest: Effort normal. No stridor.  Lymphadenopathy:    She has no cervical adenopathy.  Neurological: She is alert and oriented to person, place, and time.  Skin: Skin is warm and dry. She is not diaphoretic.  Psychiatric: She has a normal mood and affect. Her behavior is normal.    BP 120/70   Pulse  98   Temp 98 F (36.7 C) (Oral)   Ht 5\' 4"  (1.626 m)   Wt 158 lb (71.7 kg)   SpO2 95%   BMI 27.12 kg/m  Wt Readings from Last 3 Encounters:  05/14/18 158 lb (71.7 kg)  04/23/18 158 lb 9.6 oz (71.9 kg)  01/03/18 157 lb (71.2 kg)   BP Readings from Last 3 Encounters:  05/14/18 120/70  04/23/18 100/60  01/03/18 130/80   Guideline developer:  UpToDate (see UpToDate for funding source) Date Released: June 2014  Health Maintenance Due  Topic Date Due  . DEXA SCAN  03/19/2009    There are no preventive care reminders to display for this patient.  No results found for: TSH Lab Results  Component Value Date   WBC 7.9 12/05/2017   HGB 14.1 12/05/2017   HCT 41.6 12/05/2017   MCV 92.1 12/05/2017   PLT 253.0 12/05/2017   Lab Results  Component Value Date   NA 135 12/18/2017   K 4.4 12/18/2017   CO2 23 12/18/2017   GLUCOSE 114 (H) 12/18/2017   BUN 12 12/18/2017   CREATININE 0.59 12/18/2017   BILITOT 0.7 12/18/2017   ALKPHOS 65 12/18/2017   AST 16 12/18/2017   ALT 16 12/18/2017   PROT 6.9 12/18/2017   ALBUMIN 4.1 12/18/2017   CALCIUM 9.1 12/18/2017   ANIONGAP 10 05/04/2015   GFR 105.97 12/18/2017   Lab Results  Component Value Date   CHOL 230 (H) 12/18/2017   Lab Results  Component Value Date   HDL 78.30 12/18/2017   Lab Results  Component Value Date   LDLCALC 127 (H) 12/18/2017   Lab Results  Component Value Date   TRIG 120.0 12/18/2017   Lab Results    Component Value Date   CHOLHDL 3 12/18/2017   Lab Results  Component Value Date   HGBA1C 5.9 12/18/2017      Assessment & Plan:   Problem List Items Addressed This Visit      Musculoskeletal and Integument   Chalazion of right upper eyelid - Primary   Relevant Medications   doxycycline (VIBRA-TABS) 100 MG tablet   Other Relevant Orders   Ambulatory referral to Ophthalmology      Meds ordered this encounter  Medications  . doxycycline (VIBRA-TABS) 100 MG tablet    Sig: Take 1 tablet (100 mg total) by mouth 2 (two) times daily.    Dispense:  20 tablet    Refill:  0    Follow-up: No follow-ups on file.  Patient will continue warm compresses as tolerated.  She will start doxycycline.  Warnings were given about sun exposure and stomach upset.  Follow-up with ophthalmology in the next day or 2.

## 2018-05-17 DIAGNOSIS — H00021 Hordeolum internum right upper eyelid: Secondary | ICD-10-CM | POA: Diagnosis not present

## 2018-06-03 DIAGNOSIS — R490 Dysphonia: Secondary | ICD-10-CM | POA: Diagnosis not present

## 2018-06-04 ENCOUNTER — Other Ambulatory Visit: Payer: Self-pay | Admitting: Family Medicine

## 2018-06-05 DIAGNOSIS — J385 Laryngeal spasm: Secondary | ICD-10-CM | POA: Diagnosis not present

## 2018-06-06 DIAGNOSIS — Z85828 Personal history of other malignant neoplasm of skin: Secondary | ICD-10-CM | POA: Diagnosis not present

## 2018-06-06 DIAGNOSIS — H00014 Hordeolum externum left upper eyelid: Secondary | ICD-10-CM | POA: Diagnosis not present

## 2018-06-06 DIAGNOSIS — D485 Neoplasm of uncertain behavior of skin: Secondary | ICD-10-CM | POA: Diagnosis not present

## 2018-06-06 DIAGNOSIS — L570 Actinic keratosis: Secondary | ICD-10-CM | POA: Diagnosis not present

## 2018-09-05 DIAGNOSIS — R490 Dysphonia: Secondary | ICD-10-CM | POA: Diagnosis not present

## 2018-09-05 DIAGNOSIS — R498 Other voice and resonance disorders: Secondary | ICD-10-CM | POA: Diagnosis not present

## 2018-09-23 ENCOUNTER — Encounter: Payer: Medicare Other | Admitting: Family Medicine

## 2018-09-23 ENCOUNTER — Encounter: Payer: Self-pay | Admitting: Family Medicine

## 2018-09-23 ENCOUNTER — Ambulatory Visit: Payer: Self-pay

## 2018-09-23 ENCOUNTER — Telehealth: Payer: Self-pay

## 2018-09-23 NOTE — Telephone Encounter (Signed)
Questions for Screening COVID-19  Symptom onset: none Travel or Contacts: none  During this illness, did/does the patient experience any of the following symptoms? Fever >100.51F []   Yes [x]   No []   Unknown Subjective fever (felt feverish) []   Yes [x]   No []   Unknown Chills []   Yes [x]   No []   Unknown Muscle aches (myalgia) []   Yes [x]   No []   Unknown Runny nose (rhinorrhea) []   Yes [x]   No []   Unknown Sore throat []   Yes [x]   No []   Unknown Cough (new onset or worsening of chronic cough) []   Yes []   No []   Unknown Shortness of breath (dyspnea) []   Yes [x]   No []   Unknown Nausea or vomiting []   Yes [x]   No []   Unknown Headache []   Yes [x]   No []   Unknown Abdominal pain  []   Yes [x]   No []   Unknown Diarrhea (?3 loose/looser than normal stools/24hr period) []   Yes [x]   No []   Unknown Other, specify:  Patient risk factors: Smoker? []   Current []   Former [x]   Never If female, currently pregnant? []   Yes [x]   No  Patient Active Problem List   Diagnosis Date Noted  . Chalazion of right upper eyelid 05/14/2018  . Subungual hematoma of great toe of right foot 04/23/2018  . HLD (hyperlipidemia) 01/03/2018  . Hyperglycemia 01/03/2018  . Spasmodic dysphonia 10/11/2017  . Allergic rhinitis 10/11/2017  . GERD (gastroesophageal reflux disease) 10/11/2017  . GAD (generalized anxiety disorder) 10/11/2017  . Laryngospasms 02/27/2017  . Arthritis of midfoot 05/04/2016  . S/P total knee arthroplasty 05/03/2015  . Minor opacity of both corneas 08/16/2014  . Osteoarthritis of knee 12/21/2011  . Tear of medial meniscus of knee, current 12/21/2011  . Status post cataract extraction and insertion of intraocular lens 11/14/2011  . Status post LASIK surgery 10/11/2011    Plan:  []   High risk for COVID-19 with red flags go to ED (with CP, SOB, weak/lightheaded, or fever > 101.5). Call ahead.  []   High risk for COVID-19 but stable. Inform provider and coordinate time for [facility] visit.   [x]   No red  flags but URI signs or symptoms okay for [facility] visit.

## 2018-09-23 NOTE — Progress Notes (Signed)
This encounter was created in error - please disregard.

## 2018-09-23 NOTE — Telephone Encounter (Signed)
  Incoming call from Patient requesting to be tested for Covid-19.  Has not traveled out side the country. Reports having allergies.  Transferred to office to be scheduled for a visit.    Answer Assessment - Initial Assessment Questions 1. CLOSE CONTACT: "Who is the person with the confirmed or suspected COVID-19 infection that you were exposed to?"   Suspect.  2. PLACE of CONTACT: "Where were you when you were exposed to COVID-19?" (e.g., home, school, medical waiting room; which city?)     unknown 3. TYPE of CONTACT: "How much contact was there?" (e.g., sitting next to, live in same house, work in same office, same building)      4. DURATION of CONTACT: "How long were you in contact with the COVID-19 patient?" (e.g., a few seconds, passed by person, a few minutes, live with the patient)     *No Answer* 5. DATE of CONTACT: "When did you have contact with a COVID-19 patient?" (e.g., how many days ago)     *No Answer* 6. TRAVEL: "Have you traveled out of the country recently?" If so, "When and where?"     * Also ask about out-of-state travel, since the CDC has identified some high-risk cities for community spread in the [place].     * Note: Travel becomes less relevant if there is widespread community transmission where the patient lives.     denies7. COMMUNITY SPREAD: "Are there lots of cases of COVID-19 (community spread) where you live?" (See public health department website, if unsure)       unknown 8. SYMPTOMS: "Do you have any symptoms?" (e.g., fever, cough, breathing difficulty)     denies 9. PREGNANCY OR POSTPARTUM: "Is there any chance you are pregnant?" "When was your last menstrual period?" "Did you deliver in the last 2 weeks?"      10. HIGH RISK: "Do you have any heart or lung problems? Do you have a weak immune system?" (e.g., CHF, COPD, asthma, HIV positive, chemotherapy, renal failure, diabetes mellitus, sickle cell anemia)       allergy  Protocols used: CORONAVIRUS (COVID-19)  EXPOSURE-A-AH

## 2018-09-24 ENCOUNTER — Telehealth: Payer: Self-pay | Admitting: *Deleted

## 2018-09-24 ENCOUNTER — Encounter: Payer: Self-pay | Admitting: Family Medicine

## 2018-09-24 ENCOUNTER — Ambulatory Visit (INDEPENDENT_AMBULATORY_CARE_PROVIDER_SITE_OTHER): Payer: Medicare Other | Admitting: Family Medicine

## 2018-09-24 VITALS — BP 120/70 | HR 87 | Temp 98.4°F | Resp 22 | Ht 64.0 in | Wt 153.0 lb

## 2018-09-24 DIAGNOSIS — Z20822 Contact with and (suspected) exposure to covid-19: Secondary | ICD-10-CM

## 2018-09-24 DIAGNOSIS — Z20828 Contact with and (suspected) exposure to other viral communicable diseases: Secondary | ICD-10-CM

## 2018-09-24 DIAGNOSIS — R42 Dizziness and giddiness: Secondary | ICD-10-CM | POA: Insufficient documentation

## 2018-09-24 NOTE — Telephone Encounter (Signed)
Left patient a voicemail requesting a call back regarding scheduling COVID 19 test.  Test ordered.

## 2018-09-24 NOTE — Patient Instructions (Signed)
It was very nice to meet you today!  I would recommend trying zyrtec-d for about a week to help with the fluid in the ear.  Continue daily flonase as well.   Please do not hesitate to contact us if you feel like your symptoms are worsening again.

## 2018-09-24 NOTE — Assessment & Plan Note (Signed)
-  This seems to be multifactorial.  Discussed that stopping effexor suddenly was likely precipitating factor.  Improving at this time so I don't think we need to restart this and wean now.  Discussed that doxycycline can cause GI upset but unlikely at the end of the course, she has one day of this left.   -She does have a serous effusion posterior to L tm which may be causing some vertigo symptoms as well. She will try zyrtec-D and continue flonase.  She is concerned about COVID due to potential exposure and current symptoms as well, I think this is less likely but will send for testing.

## 2018-09-24 NOTE — Progress Notes (Signed)
Tamara Pope - 73 y.o. female MRN [id number]  Date of birth: 1945/12/17  Subjective Chief Complaint  Patient presents with  . Headache    W/D from SSI/SSR or ABX reaction, headache/dizziness.     HPI Tamara Pope is a 73 y.o. female here with complaint of headache and dizziness.  She has also had some nausea with this.  Initial onset of symptoms were 2 days ago and have improved today.  She reports stopping effexor recently under the advisement of her ENT and was started on clonazepam for anxiety.  She states that she stopped this "cold [place]".  She has also been taking doxycycline for a laser light skin procedure that she had recently with Dr. Lucita Ferrara.  She states she has noticed popping sensation and fullness to her L ear as well.  She denies pain, fever, chills, shortness of breath, palpitations.  She is concerned about possible COVID as well with her symptoms and is unsure if she has had any exposure.   ROS:  A comprehensive ROS was completed and negative except as noted per HPI  No Known Allergies  Past Medical History:  Diagnosis Date  . Allergy   . Arthritis   . Dysphonia, spasmodic   . GERD (gastroesophageal reflux disease)   . History of DVT (deep vein thrombosis)    JAN 2002-- S/P BREAST IMPLANTS  RIGHT UPPER ARM (AXILLARY/ SUBCLAVIN VEIN)  . PMB (postmenopausal bleeding)     Past Surgical History:  Procedure Laterality Date  . APPENDECTOMY  AS  CHILD  . AUGMENTATION MAMMAPLASTY Bilateral    2016  . BILATERAL BREAST LIFT AND IMPLANT REDUCTION  SEPT 2014  . BREAST ENHANCEMENT SURGERY Bilateral NOV 2001  . CATARACT EXTRACTION W/ INTRAOCULAR LENS  IMPLANT, BILATERAL    . DILATATION & CURRETTAGE/HYSTEROSCOPY WITH RESECTOCOPE N/A 04/18/2013   Procedure: DILATATION & CURETTAGE/HYSTEROSCOPY ;  Surgeon: [name], MD;  Location: [facility];  Service: Gynecology;  Laterality: N/A;  . KNEE ARTHROSCOPY W/ MENISCECTOMY Right 2011  .  ROTATOR CUFF REPAIR Right 2005  . TONSILLECTOMY  AS CHILD  . TOTAL KNEE ARTHROPLASTY Right 05/03/2015   Procedure: RIGHT TOTAL KNEE ARTHROPLASTY;  Surgeon: Vickey Huger, MD;  Location: [place];  Service: Orthopedics;  Laterality: Right;    Social History   Socioeconomic History  . Marital status: Married    Spouse name: Not on file  . Number of children: Not on file  . Years of education: Not on file  . Highest education level: Not on file  Occupational History    Employer: [employer]  Social Needs  . Financial resource strain: Not on file  . Food insecurity    Worry: Not on file    Inability: Not on file  . Transportation needs    Medical: Not on file    Non-medical: Not on file  Tobacco Use  . Smoking status: Never Smoker  . Smokeless tobacco: Never Used  Substance and Sexual Activity  . Alcohol use: Yes    Comment: maybe 2 glasses a week  . Drug use: No  . Sexual activity: Yes    Partners: Male  Lifestyle  . Physical activity    Days per week: Not on file    Minutes per session: Not on file  . Stress: Not on file  Relationships  . Social Herbalist on phone: Not on file    Gets together: Not on file    Attends religious  service: Not on file    Active member of club or organization: Not on file    Attends meetings of clubs or organizations: Not on file    Relationship status: Not on file  Other Topics Concern  . 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 Arthritis Mother   . Lung cancer Father   . Diabetes Brother        age 35  . Birth defects Brother   . Arthritis Maternal Grandmother   . Heart disease Maternal Grandmother   . Heart attack Paternal Grandmother   . Colon cancer Neg Hx   . Stomach cancer Neg Hx     Health  Maintenance  Topic Date Due  . DEXA SCAN  03/19/2009  . PNA vac Low Risk Adult (2 of 2 - PPSV23) 10/12/2018  . INFLUENZA VACCINE  11/02/2018  . MAMMOGRAM  07/03/2019  . TETANUS/TDAP  11/04/2019  . COLONOSCOPY  11/16/2025  . Hepatitis C Screening  Completed    ----------------------------------------------------------------------------------------------------------------------------------------------------------------------------------------------------------------- Physical Exam BP 120/70   Pulse 87   Temp 98.4 F (36.9 C) (Oral)   Resp (!) 22   Ht 5\' 4"  (1.626 m)   Wt 153 lb (69.4 kg)   SpO2 98%   BMI 26.26 kg/m   Physical Exam Constitutional:      Appearance: She is well-developed.  HENT:     Head: Normocephalic and atraumatic.     Right Ear: Tympanic membrane normal. There is no impacted cerumen.     Left Ear: There is no impacted cerumen.     Ears:     Comments: Air/fluid level posterior to L TM.      Mouth/Throat:     Mouth: Mucous membranes are moist.  Eyes:     General: No scleral icterus. Neck:     Musculoskeletal: Neck supple.  Cardiovascular:     Rate and Rhythm: Normal rate and regular rhythm.  Pulmonary:     Effort: Pulmonary effort is normal.     Breath sounds: Normal breath sounds.  Lymphadenopathy:     Cervical: No cervical adenopathy.  Skin:    General: Skin is warm and dry.  Neurological:     General: No focal deficit present.     Mental Status: She is alert.  Psychiatric:        Mood and Affect: Mood normal.        Behavior: Behavior normal.     ------------------------------------------------------------------------------------------------------------------------------------------------------------------------------------------------------------------- Assessment and Plan  Dizziness -This seems to be multifactorial.  Discussed that stopping effexor suddenly was likely precipitating factor.  Improving at this time so I don't think we need  to restart this and wean now.  Discussed that doxycycline can cause GI upset but unlikely at the end of the course, she has one day of this left.   -She does have a serous effusion posterior to L tm which may be causing some vertigo symptoms as well. She will try zyrtec-D and continue flonase.  She is concerned about COVID due to potential exposure and current symptoms as well, I think this is less likely but will send for testing.    >25 minutes spent with patient with 50% of time spent counseling and/or coordination of care for patient as  outlined above.

## 2018-09-24 NOTE — Telephone Encounter (Signed)
Pt is being seen [facility] 09/24/2018

## 2018-09-24 NOTE — Telephone Encounter (Signed)
-----   Message from Luetta Nutting, DO sent at 09/24/2018  2:52 PM EDT ----- Regarding: COVID Patient:  Tamara Pope DOB:  Dec 09, 1945 MRN: [id number] Reason for test:  Exposure to [place].  Congestion and dizziness.   Insurance:  Medicare ID: [id number]  Pt scheduled for testing at [facility] site tomorrow.  Testing process reviewed, stay in car, wear mask.Pt verbalizes understanding.  Pts CB# [phone]

## 2018-09-24 NOTE — Telephone Encounter (Signed)
-----   Message from Luetta Nutting, DO sent at 09/24/2018  2:52 PM EDT ----- Regarding: COVID Patient:  Tamara Pope DOB:  March 11, 1946 MRN: [id number] Reason for test:  Exposure to [place].  Congestion and dizziness.   Insurance:  Medicare ID: [id number]

## 2018-09-25 ENCOUNTER — Other Ambulatory Visit: Payer: Medicare Other

## 2018-09-25 DIAGNOSIS — Z20822 Contact with and (suspected) exposure to covid-19: Secondary | ICD-10-CM

## 2018-09-25 DIAGNOSIS — R6889 Other general symptoms and signs: Secondary | ICD-10-CM | POA: Diagnosis not present

## 2018-09-28 ENCOUNTER — Other Ambulatory Visit: Payer: Self-pay | Admitting: Family Medicine

## 2018-09-29 LAB — NOVEL CORONAVIRUS, NAA: SARS-CoV-2, NAA: NOT DETECTED

## 2018-09-30 NOTE — Telephone Encounter (Signed)
Pt Tamara Pope/C'ed this medication/she will need to call the office for restart/thx dmf

## 2018-09-30 NOTE — Progress Notes (Signed)
Coronavirus testing is negative.

## 2018-10-15 DIAGNOSIS — H04123 Dry eye syndrome of bilateral lacrimal glands: Secondary | ICD-10-CM | POA: Diagnosis not present

## 2018-10-15 DIAGNOSIS — H5202 Hypermetropia, left eye: Secondary | ICD-10-CM | POA: Diagnosis not present

## 2018-10-15 DIAGNOSIS — H52223 Regular astigmatism, bilateral: Secondary | ICD-10-CM | POA: Diagnosis not present

## 2018-10-15 DIAGNOSIS — Z961 Presence of intraocular lens: Secondary | ICD-10-CM | POA: Diagnosis not present

## 2018-10-15 DIAGNOSIS — H1789 Other corneal scars and opacities: Secondary | ICD-10-CM | POA: Diagnosis not present

## 2018-10-21 ENCOUNTER — Other Ambulatory Visit: Payer: Self-pay | Admitting: Family Medicine

## 2018-10-21 ENCOUNTER — Other Ambulatory Visit: Payer: Self-pay

## 2018-10-21 ENCOUNTER — Telehealth: Payer: Self-pay | Admitting: Family Medicine

## 2018-10-21 DIAGNOSIS — Z20822 Contact with and (suspected) exposure to covid-19: Secondary | ICD-10-CM

## 2018-10-21 NOTE — Telephone Encounter (Signed)
Yes of course.  Dr. Zigmund Daniel already kindly placed the order.

## 2018-10-21 NOTE — Telephone Encounter (Signed)
Pt husband calling and said she is having some covid like symptoms, and that she has had a persistent cough for the past few days and said That Dr Zigmund Daniel is sending him to get tested and he wanted to know if she could to so they don't have to make a second trip

## 2018-10-21 NOTE — Progress Notes (Signed)
Patients husband called and stated they are both experiencing cough, chills and body aches.  I have ordered test for her husband and entering orders for her as well so that they may be tested at the same time.  She also plans to follow up with Dr. Deborra Medina.

## 2018-10-22 ENCOUNTER — Telehealth (INDEPENDENT_AMBULATORY_CARE_PROVIDER_SITE_OTHER): Payer: Medicare Other | Admitting: Family Medicine

## 2018-10-22 ENCOUNTER — Telehealth: Payer: Self-pay

## 2018-10-22 ENCOUNTER — Encounter: Payer: Self-pay | Admitting: Family Medicine

## 2018-10-22 DIAGNOSIS — Z20822 Contact with and (suspected) exposure to covid-19: Secondary | ICD-10-CM

## 2018-10-22 DIAGNOSIS — R6889 Other general symptoms and signs: Secondary | ICD-10-CM | POA: Diagnosis not present

## 2018-10-22 DIAGNOSIS — U071 COVID-19: Secondary | ICD-10-CM

## 2018-10-22 MED ORDER — PROMETHAZINE-DM 6.25-15 MG/5ML PO SYRP: 5.0000 mL | ORAL_SOLUTION | Freq: Four times a day (QID) | ORAL | 0 refills | Status: DC | PRN

## 2018-10-22 NOTE — Telephone Encounter (Signed)
Pt seen by pcp today

## 2018-10-22 NOTE — Progress Notes (Signed)
Virtual Visit via Video   Due to the COVID-19 pandemic, this visit was completed with telemedicine (audio/video) technology to reduce patient and provider exposure as well as to preserve personal protective equipment.   I connected with Tamara Pope by a video enabled telemedicine application and verified that I am speaking with the correct person using two identifiers. Location patient: Home Location provider: [facility], Office Persons participating in the virtual visit: Evalee Mutton, MD   I discussed the limitations of evaluation and management by telemedicine and the availability of in person appointments. The patient expressed understanding and agreed to proceed.  Care Team   Patient Care Team: Lucille Passy, MD as PCP - General (Family Medicine)  Subjective:   HPI:   Both she and her husband were tested for Covid yesterday.  She has a persistent cough.  In the morning she keeps having a nonproductive coughing fits. Right before bed has those coughing fits. Cannot eat anything other than soups and protein drinks. She also states that she is very fatigued with muscle aches. Chills as night.Symptoms started last Wednesday and have progressed, Tmax 100.  Last temp was 3 days ago.  No difficulty breathing.  They decided to go to and [facility]) who provided rapid tests- both tested positive.  She was also tested on 09/24/16- those results were negative.  Obviously yesterday's test [facility] results are still pending.  They decided to go to and UC who provided rapid tests- both she and her husband tested positive.  They are self quarantining.  Review of Systems  Constitutional: Positive for chills and fever.  HENT: Negative.   Respiratory: Positive for cough. Negative for hemoptysis, sputum production, shortness of breath and wheezing.   Gastrointestinal: Negative.   Genitourinary: Negative.   Musculoskeletal: Positive for myalgias.   Skin: Negative.   Neurological: Negative.   Endo/Heme/Allergies: Negative.   Psychiatric/Behavioral: Negative.      Patient Active Problem List   Diagnosis Date Noted  . Suspected Covid-19 Virus Infection 10/22/2018  . Dizziness 09/24/2018  . Chalazion of right upper eyelid 05/14/2018  . Subungual hematoma of great toe of right foot 04/23/2018  . HLD (hyperlipidemia) 01/03/2018  . Hyperglycemia 01/03/2018  . Spasmodic dysphonia 10/11/2017  . Allergic rhinitis 10/11/2017  . GERD (gastroesophageal reflux disease) 10/11/2017  . GAD (generalized anxiety disorder) 10/11/2017  . Laryngospasms 02/27/2017  . Arthritis of midfoot 05/04/2016  . S/P total knee arthroplasty 05/03/2015  . Minor opacity of both corneas 08/16/2014  . Osteoarthritis of knee 12/21/2011  . Tear of medial meniscus of knee, current 12/21/2011  . Status post cataract extraction and insertion of intraocular lens 11/14/2011  . Status post LASIK surgery 10/11/2011    Social History   Tobacco Use  . Smoking status: Never Smoker  . Smokeless tobacco: Never Used  Substance Use Topics  . Alcohol use: Yes    Comment: maybe 2 glasses a week    Current Outpatient Medications:  .  augmented betamethasone dipropionate (DIPROLENE-AF) 0.05 % cream, APPLY A THIN LAYER AA 2-3 TIMES DAILY, Disp: , Rfl:  .  Biotin 5000 MCG TABS, Take 1 tablet by mouth daily., Disp: , Rfl:  .  Calcium Carb-Cholecalciferol (CALTRATE 600+D3) 600-800 MG-UNIT TABS, Take 1 tablet by mouth 2 (two) times daily., Disp: , Rfl:  .  cetirizine (ZYRTEC) 10 MG tablet, Take 10 mg by mouth daily., Disp: , Rfl:  .  Cholecalciferol (VITAMIN D3) 2000 units TABS, Take 1 tablet  by mouth daily., Disp: , Rfl:  .  clonazePAM (KLONOPIN) 0.5 MG tablet, , Disp: , Rfl:  .  estradiol (VIVELLE-DOT) 0.05 MG/24HR patch, APPLY 1 PATCH EXTERNALY TO SKIN TWICE WEEKLY AS DIRECTED, Disp: , Rfl: 0 .  fluticasone (FLONASE) 50 MCG/ACT nasal spray, Place 1 spray into both nostrils  daily as needed for allergies or rhinitis., Disp: , Rfl:  .  Probiotic Product (ALIGN) 4 MG CAPS, Take 1 capsule by mouth daily., Disp: , Rfl:  .  progesterone (PROMETRIUM) 100 MG capsule, Take 100 mg by mouth daily., Disp: , Rfl:  .  VITAMIN K PO, Take 1 tablet by mouth daily., Disp: , Rfl:   No Known Allergies  Objective:  BP 106/70   Temp 98.7 F (37.1 C) (Oral)   VITALS: Per patient if applicable, see vitals. GENERAL: Alert, appears well and in no acute distress. HEENT: Atraumatic, conjunctiva clear, no obvious abnormalities on inspection of external nose and ears. NECK: Normal movements of the head and neck. CARDIOPULMONARY: No increased WOB. Speaking in clear sentences. I:E ratio WNL.  MS: Moves all visible extremities without noticeable abnormality. PSYCH: Pleasant and cooperative, well-groomed. Speech normal rate and rhythm. Affect is appropriate. Insight and judgement are appropriate. Attention is focused, linear, and appropriate.  NEURO: CN grossly intact. Oriented as arrived to appointment on time with no prompting. Moves both UE equally.  SKIN: No obvious lesions, wounds, erythema, or cyanosis noted on face or hands.  Depression screen [facility] 2/9 04/23/2018 12/19/2017 10/11/2017  Decreased Interest 0 0 0  Down, Depressed, Hopeless 0 0 0  PHQ - 2 Score 0 0 0    Assessment and Plan:   Tamara Pope was seen today for cough.  Diagnoses and all orders for this visit:  Suspected Covid-19 Virus Infection    . COVID-19 Education: The signs and symptoms of COVID-19 were discussed with the patient and how to seek care for testing if needed. The importance of social distancing was discussed today. . Reviewed expectations re: course of current medical issues. . Discussed self-management of symptoms. . Outlined signs and symptoms indicating need for more acute intervention. . Patient verbalized understanding and all questions were answered. Marland Kitchen Health Maintenance issues including  appropriate healthy diet, exercise, and smoking avoidance were discussed with patient. . See orders for this visit as documented in the electronic medical record.  Arnette Norris, MD  Records requested if needed. Time spent: 25 minutes, of which >50% was spent in obtaining information about her symptoms, reviewing her previous labs, evaluations, and treatments, counseling her about her condition (please see the discussed topics above), and developing a plan to further investigate it; she had a number of questions which I addressed.

## 2018-10-22 NOTE — Telephone Encounter (Signed)
LMOVM for pt to call [phone] to go over check in process/thx dmf

## 2018-10-22 NOTE — Assessment & Plan Note (Signed)
>  25 minutes spent in face to face time with patient, >50% spent in counselling or coordination of care discussing COVID 19.  Has had one rapid test positive through [facility], [facility] is still pending.  They are self quarantining. I have sent in orders for covid home monitoring and for promethazine DM for severe cough.  Discussed sedation precautions. The patient indicates understanding of these issues and agrees with the plan.  Call or send my chart message prn if these symptoms worsen or fail to improve as anticipated.

## 2018-10-24 LAB — NOVEL CORONAVIRUS, NAA: SARS-CoV-2, NAA: DETECTED — AB

## 2018-10-25 ENCOUNTER — Telehealth: Payer: Self-pay

## 2018-10-25 NOTE — Telephone Encounter (Signed)
LMOVM that as she already knew the test is positive and to let us know if she needs anything and to use OTC Tx/thx dmf

## 2018-10-26 ENCOUNTER — Telehealth: Payer: Self-pay | Admitting: Family Medicine

## 2018-10-26 MED ORDER — HYDROCODONE-HOMATROPINE 5-1.5 MG/5ML PO SYRP: 5.0000 mL | ORAL_SOLUTION | Freq: Three times a day (TID) | ORAL | 0 refills | Status: DC | PRN

## 2018-10-26 NOTE — Telephone Encounter (Signed)
Pt called my cell phone this morning asking for stronger cough medicine.  She is still kept awake at night by cough despite taking promethazine DM.  She and her husband both recently diagnosed with COVID.  She is feeling okay other than fatigue and cough.  eRx sent in for hycodan.  Advised NOT to take promethazine and hycodan as both cause sedation and could cause depression of respiratory drive. The patient indicates understanding of these issues and agrees with the plan.

## 2018-11-11 DIAGNOSIS — N950 Postmenopausal bleeding: Secondary | ICD-10-CM | POA: Diagnosis not present

## 2018-11-25 DIAGNOSIS — H52211 Irregular astigmatism, right eye: Secondary | ICD-10-CM | POA: Diagnosis not present

## 2018-11-25 DIAGNOSIS — Z9842 Cataract extraction status, left eye: Secondary | ICD-10-CM | POA: Diagnosis not present

## 2018-11-25 DIAGNOSIS — Z8669 Personal history of other diseases of the nervous system and sense organs: Secondary | ICD-10-CM | POA: Diagnosis not present

## 2018-11-25 DIAGNOSIS — Z9889 Other specified postprocedural states: Secondary | ICD-10-CM | POA: Diagnosis not present

## 2018-11-25 DIAGNOSIS — Z961 Presence of intraocular lens: Secondary | ICD-10-CM | POA: Diagnosis not present

## 2018-11-25 DIAGNOSIS — H26493 Other secondary cataract, bilateral: Secondary | ICD-10-CM | POA: Diagnosis not present

## 2018-11-25 DIAGNOSIS — Z9841 Cataract extraction status, right eye: Secondary | ICD-10-CM | POA: Diagnosis not present

## 2018-12-10 DIAGNOSIS — J383 Other diseases of vocal cords: Secondary | ICD-10-CM | POA: Insufficient documentation

## 2018-12-10 DIAGNOSIS — Z6824 Body mass index (BMI) 24.0-24.9, adult: Secondary | ICD-10-CM | POA: Diagnosis not present

## 2018-12-10 DIAGNOSIS — Z01419 Encounter for gynecological examination (general) (routine) without abnormal findings: Secondary | ICD-10-CM | POA: Diagnosis not present

## 2018-12-12 DIAGNOSIS — L821 Other seborrheic keratosis: Secondary | ICD-10-CM | POA: Diagnosis not present

## 2018-12-12 DIAGNOSIS — L814 Other melanin hyperpigmentation: Secondary | ICD-10-CM | POA: Diagnosis not present

## 2018-12-12 DIAGNOSIS — L820 Inflamed seborrheic keratosis: Secondary | ICD-10-CM | POA: Diagnosis not present

## 2018-12-12 DIAGNOSIS — L720 Epidermal cyst: Secondary | ICD-10-CM | POA: Diagnosis not present

## 2018-12-12 DIAGNOSIS — D225 Melanocytic nevi of trunk: Secondary | ICD-10-CM | POA: Diagnosis not present

## 2018-12-12 DIAGNOSIS — Z85828 Personal history of other malignant neoplasm of skin: Secondary | ICD-10-CM | POA: Diagnosis not present

## 2018-12-12 DIAGNOSIS — L570 Actinic keratosis: Secondary | ICD-10-CM | POA: Diagnosis not present

## 2019-01-26 DIAGNOSIS — Z23 Encounter for immunization: Secondary | ICD-10-CM | POA: Diagnosis not present

## 2019-01-27 DIAGNOSIS — H0013 Chalazion right eye, unspecified eyelid: Secondary | ICD-10-CM | POA: Diagnosis not present

## 2019-01-27 DIAGNOSIS — Z9841 Cataract extraction status, right eye: Secondary | ICD-10-CM | POA: Diagnosis not present

## 2019-01-27 DIAGNOSIS — Z9849 Cataract extraction status, unspecified eye: Secondary | ICD-10-CM | POA: Diagnosis not present

## 2019-01-27 DIAGNOSIS — H04123 Dry eye syndrome of bilateral lacrimal glands: Secondary | ICD-10-CM | POA: Diagnosis not present

## 2019-01-27 DIAGNOSIS — H52211 Irregular astigmatism, right eye: Secondary | ICD-10-CM | POA: Diagnosis not present

## 2019-01-27 DIAGNOSIS — Z961 Presence of intraocular lens: Secondary | ICD-10-CM | POA: Diagnosis not present

## 2019-01-27 DIAGNOSIS — Z9842 Cataract extraction status, left eye: Secondary | ICD-10-CM | POA: Diagnosis not present

## 2019-01-27 DIAGNOSIS — H26493 Other secondary cataract, bilateral: Secondary | ICD-10-CM | POA: Diagnosis not present

## 2019-01-28 NOTE — Progress Notes (Signed)
Virtual Visit via Video Note  I connected with patient on 01/29/19 at  1:45 PM EDT by audio enabled telemedicine application and verified that I am speaking with the correct person using two identifiers.   THIS ENCOUNTER IS A VIRTUAL VISIT DUE TO COVID-19 - PATIENT WAS NOT SEEN IN THE OFFICE. PATIENT HAS CONSENTED TO VIRTUAL VISIT / TELEMEDICINE VISIT   Location of patient: daughter's home in [place]  Location of provider: office  I discussed the limitations of evaluation and management by telemedicine and the availability of in person appointments. The patient expressed understanding and agreed to proceed.   Subjective:   Tamara Pope is a 73 y.o. female who presents for Medicare Annual (Subsequent) preventive examination.  Pt reports she had Covid 7/20. Reports symptoms were gone within a week.  Pt is still traveling a lot. She is also in a book club.   Review of Systems:    Home Safety/Smoke Alarms: Feels safe in home. Smoke alarms in place.  Lives w/ husband in 3 story home. No issue w/ stairs. Does stairs as part of her exercise routine.    Female:   Mammo- per pt. Done w/ Dr.Holland 6 months ago.   Dexa scan- per pt. Done w/ Dr.Holland 6 months ago.        CCS-11/17/15    Objective:     Vitals: Unable to assess. This visit is enabled though telemedicine due to Covid 19.   Advanced Directives 01/29/2019 12/19/2017 04/19/2015 04/18/2013  Does Patient Have a Medical Advance Directive? Yes Yes Yes Patient has advance directive, copy not in chart  Type of Advance Directive [place];Living will [place];Living will [place];Living will [place];Living will  Does patient want to make changes to medical advance directive? No - Patient declined - No - Patient declined No  Copy of Healthcare Power of Attorney in Chart? Yes - validated most recent copy scanned in chart (See row information) No - copy  requested No - copy requested Copy requested from family    Tobacco Social History   Tobacco Use  Smoking Status Never Smoker  Smokeless Tobacco Never Used     Counseling given: Not Answered   Clinical Intake: Pain : No/denies pain     Past Medical History:  Diagnosis Date  . Allergy   . Arthritis   . Dysphonia, spasmodic   . GERD (gastroesophageal reflux disease)   . History of DVT (deep vein thrombosis)    JAN 2002-- S/P BREAST IMPLANTS  RIGHT UPPER ARM (AXILLARY/ SUBCLAVIN VEIN)  . PMB (postmenopausal bleeding)    Past Surgical History:  Procedure Laterality Date  . APPENDECTOMY  AS  CHILD  . AUGMENTATION MAMMAPLASTY Bilateral    2016  . BILATERAL BREAST LIFT AND IMPLANT REDUCTION  SEPT 2014  . BREAST ENHANCEMENT SURGERY Bilateral NOV 2001  . CATARACT EXTRACTION W/ INTRAOCULAR LENS  IMPLANT, BILATERAL    . DILATATION & CURRETTAGE/HYSTEROSCOPY WITH RESECTOCOPE N/A 04/18/2013   Procedure: DILATATION & CURETTAGE/HYSTEROSCOPY ;  Surgeon: [name], MD;  [facility];  Service: Gynecology;  Laterality: N/A;  . KNEE ARTHROSCOPY W/ MENISCECTOMY Right 2011  . ROTATOR CUFF REPAIR Right 2005  . TONSILLECTOMY  AS CHILD  . TOTAL KNEE ARTHROPLASTY Right 05/03/2015   Procedure: RIGHT TOTAL KNEE ARTHROPLASTY;  Surgeon: Vickey Huger, MD;  Location: [place];  Service: Orthopedics;  Laterality: Right;   Family History  Problem Relation Age of Onset  .  Arthritis Mother   . Lung cancer Father   . Diabetes Brother        age 77  . Birth defects Brother   . Arthritis Maternal Grandmother   . Heart disease Maternal Grandmother   . Heart attack Paternal Grandmother   . Colon cancer Neg Hx   . Stomach cancer Neg Hx    Social History   Socioeconomic History  . Marital status: Married    Spouse name: Not on file  . Number of children: Not on file  . Years of education: Not on file  . Highest education level: Not on file  Occupational History     Employer: [employer].  Social Needs  . Financial resource strain: Not on file  . Food insecurity    Worry: Not on file    Inability: Not on file  . Transportation needs    Medical: Not on file    Non-medical: Not on file  Tobacco Use  . Smoking status: Never Smoker  . Smokeless tobacco: Never Used  Substance and Sexual Activity  . Alcohol use: Yes    Comment: maybe 2 glasses a week  . Drug use: No  . Sexual activity: Yes    Partners: Male  Lifestyle  . Physical activity    Days per week: Not on file    Minutes per session: Not on file  . Stress: Not on file  Relationships  . Social Herbalist on phone: Not on file    Gets together: Not on file    Attends religious service: Not on file    Active member of club or organization: Not on file    Attends meetings of clubs or organizations: Not on file    Relationship status: Not on file  Other Topics Concern  . Not on file  Social History Narrative   Married   Orig from [place], [place]   Has been Event organiser of [employer]   At least 1 daughter   Second home [place] - enjoys yoga    Outpatient Encounter Medications as of 01/29/2019  Medication Sig  . augmented betamethasone dipropionate (DIPROLENE-AF) 0.05 % cream APPLY A THIN LAYER AA 2-3 TIMES DAILY  . Biotin 5000 MCG TABS Take 1 tablet by mouth daily.  . Calcium Carb-Cholecalciferol (CALTRATE 600+D3) 600-800 MG-UNIT TABS Take 1 tablet by mouth 2 (two) times daily.  . cetirizine (ZYRTEC) 10 MG tablet Take 10 mg by mouth daily.  . Cholecalciferol (VITAMIN D3) 2000 units TABS Take 1 tablet by mouth daily.  . clonazePAM (KLONOPIN) 0.5 MG tablet   . estradiol (VIVELLE-DOT) 0.05 MG/24HR patch APPLY 1 PATCH EXTERNALY TO SKIN TWICE WEEKLY AS DIRECTED  . fluticasone (FLONASE) 50 MCG/ACT nasal spray Place 1 spray into both nostrils daily as needed for allergies or rhinitis.  . Probiotic Product (ALIGN) 4 MG CAPS Take 1 capsule by mouth  daily.  . progesterone (PROMETRIUM) 100 MG capsule Take 100 mg by mouth daily.  Marland Kitchen VITAMIN K PO Take 1 tablet by mouth daily.  Marland Kitchen HYDROcodone-homatropine (HYCODAN) 5-1.5 MG/5ML syrup Take 5 mLs by mouth every 8 (eight) hours as needed for cough. (Patient not taking: Reported on 01/29/2019)   No facility-administered encounter medications on file as of 01/29/2019.     Activities of Daily Living In your present state of health, do you have any difficulty performing the following activities: 01/29/2019 04/23/2018  Hearing? N N  Vision? N N  Difficulty concentrating or making  decisions? N N  Walking or climbing stairs? N N  Dressing or bathing? N N  Doing errands, shopping? N N  Preparing Food and eating ? N -  Using the Toilet? N -  In the past six months, have you accidently leaked urine? N -  Do you have problems with loss of bowel control? N -  Managing your Medications? N -  Managing your Finances? N -  Housekeeping or managing your Housekeeping? N -  Some recent data might be hidden    Patient Care Team: Lucille Passy, MD as PCP - General (Family Medicine)    Assessment:   This is a routine wellness examination for [place]. Physical assessment deferred to PCP.  Exercise Activities and Dietary recommendations Current Exercise Habits: Home exercise routine, Type of exercise: strength training/weights;walking, Time (Minutes): 45, Frequency (Times/Week): 7, Weekly Exercise (Minutes/Week): 315, Intensity: Mild, Exercise limited by: None identified Diet (meal preparation, eat out, water intake, caffeinated beverages, dairy products, fruits and vegetables): in general, a "healthy" diet  , well balanced   Goals    . Weight (lb) < 148 lb (67.1 kg)     Continue to exercise and eat healthy.        Fall Risk Fall Risk  01/29/2019 04/23/2018 12/19/2017 10/11/2017  Falls in the past year? 0 0 No No  Number falls in past yr: 0 - - -  Injury with Fall? 0 - - -  Follow up - Falls evaluation  completed - -    Depression Screen PHQ 2/9 Scores 01/29/2019 04/23/2018 12/19/2017 10/11/2017  PHQ - 2 Score 0 0 0 0     Cognitive Function Ad8 score reviewed for issues:  Issues making decisions:no  Less interest in hobbies / activities:no  Repeats questions, stories (family complaining):no  Trouble using ordinary gadgets (microwave, computer, phone):no  Forgets the month or year: no  Mismanaging finances: no  Remembering appts:no  Daily problems with thinking and/or memory:no Ad8 score is=0         Immunization History  Administered Date(s) Administered  . Influenza, High Dose Seasonal PF 01/14/2015, 01/03/2018  . Pneumococcal Conjugate-13 10/11/2017  . Tdap 11/03/2009    Screening Tests Health Maintenance  Topic Date Due  . DEXA SCAN  03/19/2009  . PNA vac Low Risk Adult (2 of 2 - PPSV23) 10/12/2018  . INFLUENZA VACCINE  11/02/2018  . MAMMOGRAM  07/03/2019  . TETANUS/TDAP  11/04/2019  . COLONOSCOPY  11/16/2025  . Hepatitis C Screening  Completed       Plan:    Please schedule your next medicare wellness visit with me in 1 yr.  Continue to eat heart healthy diet (full of fruits, vegetables, whole grains, lean protein, water--limit salt, fat, and sugar intake) and increase physical activity as tolerated.  Continue doing brain stimulating activities (puzzles, reading, adult coloring books, staying active) to keep memory sharp.     I have personally reviewed and noted the following in the patient's chart:   . Medical and social history . Use of alcohol, tobacco or illicit drugs  . Current medications and supplements . Functional ability and status . Nutritional status . Physical activity . Advanced directives . List of other physicians . Hospitalizations, surgeries, and ER visits in previous 12 months . Vitals . Screenings to include cognitive, depression, and falls . Referrals and appointments  In addition, I have reviewed and discussed with  patient certain preventive protocols, quality metrics, and best practice recommendations. A written personalized care plan  for preventive services as well as general preventive health recommendations were provided to patient.     Shela Nevin, [place]  01/29/2019

## 2019-01-29 ENCOUNTER — Other Ambulatory Visit: Payer: Self-pay

## 2019-01-29 ENCOUNTER — Encounter: Payer: Self-pay | Admitting: *Deleted

## 2019-01-29 ENCOUNTER — Ambulatory Visit (INDEPENDENT_AMBULATORY_CARE_PROVIDER_SITE_OTHER): Payer: Medicare Other | Admitting: *Deleted

## 2019-01-29 DIAGNOSIS — Z0000 Encounter for general adult medical examination without abnormal findings: Secondary | ICD-10-CM | POA: Diagnosis not present

## 2019-01-29 NOTE — Patient Instructions (Signed)
Please schedule your next medicare wellness visit with me in 1 yr.  Continue to eat heart healthy diet (full of fruits, vegetables, whole grains, lean protein, water--limit salt, fat, and sugar intake) and increase physical activity as tolerated.  Continue doing brain stimulating activities (puzzles, reading, adult coloring books, staying active) to keep memory sharp.    Ms. Tamara Pope , Thank you for taking time to come for your Medicare Wellness Visit. I appreciate your ongoing commitment to your health goals. Please review the following plan we discussed and let me know if I can assist you in the future.   These are the goals we discussed: Goals    . Maintain healthy active lifestyle.    . Weight (lb) < 148 lb (67.1 kg)     Continue to exercise and eat healthy.        This is a list of the screening recommended for you and due dates:  Health Maintenance  Topic Date Due  . DEXA scan (bone density measurement)  03/19/2009  . Pneumonia vaccines (2 of 2 - PPSV23) 10/12/2018  . Flu Shot  11/02/2018  . Mammogram  07/03/2019  . Tetanus Vaccine  11/04/2019  . Colon Cancer Screening  11/16/2025  .  Hepatitis C: One time screening is recommended by Center for Disease Control  (CDC) for  adults born from [birth year] through 1967.   Completed    Preventive Care 49 Years and Older, Female Preventive care refers to lifestyle choices and visits with your health care provider that can promote health and wellness. This includes:  A yearly physical exam. This is also called an annual well check.  Regular dental and eye exams.  Immunizations.  Screening for certain conditions.  Healthy lifestyle choices, such as diet and exercise. What can I expect for my preventive care visit? Physical exam Your health care provider will check:  Height and weight. These may be used to calculate body mass index (BMI), which is a measurement that tells if you are at a healthy weight.  Heart rate and blood  pressure.  Your skin for abnormal spots. Counseling Your health care provider may ask you questions about:  Alcohol, tobacco, and drug use.  Emotional well-being.  Home and relationship well-being.  Sexual activity.  Eating habits.  History of falls.  Memory and ability to understand (cognition).  Work and work Statistician.  Pregnancy and menstrual history. What immunizations do I need?  Influenza (flu) vaccine  This is recommended every year. Tetanus, diphtheria, and pertussis (Tdap) vaccine  You may need a Td booster every 10 years. Varicella (chickenpox) vaccine  You may need this vaccine if you have not already been vaccinated. Zoster (shingles) vaccine  You may need this after age 16. Pneumococcal conjugate (PCV13) vaccine  One dose is recommended after age 75. Pneumococcal polysaccharide (PPSV23) vaccine  One dose is recommended after age 70. Measles, mumps, and rubella (MMR) vaccine  You may need at least one dose of MMR if you were born in 1957 or later. You may also need a second dose. Meningococcal conjugate (MenACWY) vaccine  You may need this if you have certain conditions. Hepatitis A vaccine  You may need this if you have certain conditions or if you travel or work in places where you may be exposed to hepatitis A. Hepatitis B vaccine  You may need this if you have certain conditions or if you travel or work in places where you may be exposed to hepatitis B. Haemophilus influenzae type  b (Hib) vaccine  You may need this if you have certain conditions. You may receive vaccines as individual doses or as more than one vaccine together in one shot (combination vaccines). Talk with your health care provider about the risks and benefits of combination vaccines. What tests do I need? Blood tests  Lipid and cholesterol levels. These may be checked every 5 years, or more frequently depending on your overall health.  Hepatitis C test.  Hepatitis B  test. Screening  Lung cancer screening. You may have this screening every year starting at age 75 if you have a 30-pack-year history of smoking and currently smoke or have quit within the past 15 years.  Colorectal cancer screening. All adults should have this screening starting at age 7 and continuing until age 71. Your health care provider may recommend screening at age 46 if you are at increased risk. You will have tests every 1-10 years, depending on your results and the type of screening test.  Diabetes screening. This is done by checking your blood sugar (glucose) after you have not eaten for a while (fasting). You may have this done every 1-3 years.  Mammogram. This may be done every 1-2 years. Talk with your health care provider about how often you should have regular mammograms.  BRCA-related cancer screening. This may be done if you have a family history of breast, ovarian, tubal, or peritoneal cancers. Other tests  Sexually transmitted disease (STD) testing.  Bone density scan. This is done to screen for osteoporosis. You may have this done starting at age 38. Follow these instructions at home: Eating and drinking  Eat a diet that includes fresh fruits and vegetables, whole grains, lean protein, and low-fat dairy products. Limit your intake of foods with high amounts of sugar, saturated fats, and salt.  Take vitamin and mineral supplements as recommended by your health care provider.  Do not drink alcohol if your health care provider tells you not to drink.  If you drink alcohol: ? Limit how much you have to 0-1 drink a day. ? Be aware of how much alcohol is in your drink. In the [place] one drink equals one 12 oz bottle of beer (355 mL), one 5 oz glass of wine (148 mL), or one 1 oz glass of hard liquor (44 mL). Lifestyle  Take daily care of your teeth and gums.  Stay active. Exercise for at least 30 minutes on 5 or more days each week.  Do not use any products that  contain nicotine or tobacco, such as cigarettes, e-cigarettes, and chewing tobacco. If you need help quitting, ask your health care provider.  If you are sexually active, practice safe sex. Use a condom or other form of protection in order to prevent STIs (sexually transmitted infections).  Talk with your health care provider about taking a low-dose aspirin or statin. What's next?  Go to your health care provider once a year for a well check visit.  Ask your health care provider how often you should have your eyes and teeth checked.  Stay up to date on all vaccines. This information is not intended to replace advice given to you by your health care provider. Make sure you discuss any questions you have with your health care provider. Document Released: 04/16/2015 Document Revised: 03/14/2018 Document Reviewed: 03/14/2018 Elsevier Patient Education  2020 [employer].

## 2019-02-06 DIAGNOSIS — L239 Allergic contact dermatitis, unspecified cause: Secondary | ICD-10-CM | POA: Diagnosis not present

## 2019-02-06 DIAGNOSIS — L011 Impetiginization of other dermatoses: Secondary | ICD-10-CM | POA: Diagnosis not present

## 2019-02-06 DIAGNOSIS — L0889 Other specified local infections of the skin and subcutaneous tissue: Secondary | ICD-10-CM | POA: Diagnosis not present

## 2019-02-06 NOTE — Patient Instructions (Signed)
Please call [facility] at [phone] to schedule your Bone Density Scan :)

## 2019-02-10 ENCOUNTER — Ambulatory Visit: Payer: Medicare Other | Admitting: Family Medicine

## 2019-02-15 DIAGNOSIS — Z8619 Personal history of other infectious and parasitic diseases: Secondary | ICD-10-CM | POA: Insufficient documentation

## 2019-02-15 DIAGNOSIS — Z8616 Personal history of COVID-19: Secondary | ICD-10-CM | POA: Insufficient documentation

## 2019-02-15 DIAGNOSIS — E559 Vitamin D deficiency, unspecified: Secondary | ICD-10-CM | POA: Insufficient documentation

## 2019-02-15 NOTE — Progress Notes (Signed)
Subjective:   Patient ID: Tamara Pope, female    DOB: 06-07-1945, 73 y.o.   MRN: [id number]  Tamara Pope is a pleasant 73 y.o. year old female who presents to clinic today with Follow-up (Labs already future ordered, She is due for her PNV-23 all other immunizations are UTD. Mammogram UTD next due on 4.1.2021. BMD is UTD per pt (GYN),  Her Colonoscopy is UTD next due on 8.106.2027) and Medication Problem (Pt would like to discuss some othe anxiety medications with Dr. Deborra Medina today.  Pt is not fasting today.)  on 02/18/2019  HPI:  Follow up-  Saw Gary Fleet, RN for virtual wellness visit on 01/29/19.  Note reviewed.  At that time, she was at her daughter's home in [place].Mammogram and DEXA scan done at [facility]YN- Dr. Matthew Saras.  Colonoscopy is UTD.  Received Prevnar 13 on 10/11/17- due for pneumovax today.  She tested positive for covid in 7/20 as did her husband but her course was much easier than his- she only had mild symptoms for a week.  Her husband was admitted to the hospital but has recovered well.  Spasmodic dysphonia- has been followed at [facility] for this . Receives botox injections for it- currently has been 4 months since her last injection. Being followed at [facility]. She has appointment scheduled with Dr. Patrice Paradise on 03/19/19.  GAD- Was taking effexor but no longer on her medication list.  When I saw her in 01/2018, she had just decreased effexor to 75 mg daily earlier this year. She feels this dose is working well. Since she and her husband diagnosed with covid, she has been anxious, not sleeping.  Depression screen [facility] 2/9 02/18/2019 01/29/2019 04/23/2018 12/19/2017 10/11/2017  Decreased Interest 0 0 0 0 0  Down, Depressed, Hopeless 1 0 0 0 0  PHQ - 2 Score 1 0 0 0 0  Altered sleeping 1 - - - -  Tired, decreased energy 0 - - - -  Change in appetite 0 - - - -  Feeling bad or failure about yourself  1 - - - -  Trouble concentrating 1 - - - -  Moving slowly or  fidgety/restless 2 - - - -  Suicidal thoughts 0 - - - -  PHQ-9 Score 6 - - - -  Difficult doing work/chores Somewhat difficult - - - -   GAD 7 : Generalized Anxiety Score 02/18/2019  Nervous, Anxious, on Edge 3  Control/stop worrying 3  Worry too much - different things 3  Trouble relaxing 3  Restless 2  Easily annoyed or irritable 1  Afraid - awful might happen 1  Total GAD 7 Score 16  Anxiety Difficulty Somewhat difficult       Vitamin D deficiency- currently taking 2000 IU of Vitamin D daily.  No results found for: VD25OH   HLD- LDL was very high 9/4 but she was not fasting.  Improved diet and returned fasting on 12/18/17 and lipids were improved dramatically.  Lab Results  Component Value Date   CHOL 230 (H) 12/18/2017   HDL 78.30 12/18/2017   LDLCALC 127 (H) 12/18/2017   TRIG 120.0 12/18/2017   CHOLHDL 3 12/18/2017   The 10-year ASCVD risk score Mikey Bussing DC Jr., et al., 2013) is: 11.1%   Values used to calculate the score:     Age: 47 years     Sex: Female     Is Non-Hispanic African American: No     Diabetic: No  Tobacco smoker: No     Systolic Blood Pressure: [id number] mmHg     Is BP treated: No     HDL Cholesterol: 78.3 mg/dL     Total Cholesterol: 230 mg/dL  Dry eye syndrome- followed by Dr. Thayer Jew at [place] saw Dr. Gordy Clement on 01/27/19- note reviewed.   has appt scheduled with him on 06/02/19.  Current Outpatient Medications on File Prior to Visit  Medication Sig Dispense Refill   augmented betamethasone dipropionate (DIPROLENE-AF) 0.05 % cream APPLY A THIN LAYER AA 2-3 TIMES DAILY     Biotin 5000 MCG TABS Take 1 tablet by mouth daily.     Calcium Carb-Cholecalciferol (CALTRATE 600+D3) 600-800 MG-UNIT TABS Take 1 tablet by mouth 2 (two) times daily.     cetirizine (ZYRTEC) 10 MG tablet Take 10 mg by mouth daily.     Cholecalciferol (VITAMIN D3) 2000 units TABS Take 1 tablet by mouth daily.     clonazePAM (KLONOPIN) 0.5 MG tablet      fluticasone  (FLONASE) 50 MCG/ACT nasal spray Place 1 spray into both nostrils daily as needed for allergies or rhinitis.     Probiotic Product (ALIGN) 4 MG CAPS Take 1 capsule by mouth daily.     No current facility-administered medications on file prior to visit.     No Known Allergies  Past Medical History:  Diagnosis Date   Allergy    Arthritis    Dysphonia, spasmodic    GERD (gastroesophageal reflux disease)    History of DVT (deep vein thrombosis)    JAN 2002-- S/P BREAST IMPLANTS  RIGHT UPPER ARM (AXILLARY/ SUBCLAVIN VEIN)   PMB (postmenopausal bleeding)     Past Surgical History:  Procedure Laterality Date   APPENDECTOMY  AS  CHILD   AUGMENTATION MAMMAPLASTY Bilateral    2016   BILATERAL BREAST LIFT AND IMPLANT REDUCTION  SEPT 2014   BREAST ENHANCEMENT SURGERY Bilateral NOV 2001   CATARACT EXTRACTION W/ INTRAOCULAR LENS  IMPLANT, BILATERAL     DILATATION & CURRETTAGE/HYSTEROSCOPY WITH RESECTOCOPE N/A 04/18/2013   Procedure: DILATATION & CURETTAGE/HYSTEROSCOPY ;  Surgeon: [name], MD;  Location: [place];  Service: Gynecology;  Laterality: N/A;   KNEE ARTHROSCOPY W/ MENISCECTOMY Right 2011   ROTATOR CUFF REPAIR Right 2005   TONSILLECTOMY  AS CHILD   TOTAL KNEE ARTHROPLASTY Right 05/03/2015   Procedure: RIGHT TOTAL KNEE ARTHROPLASTY;  Surgeon: Vickey Huger, MD;  Location: [place];  Service: Orthopedics;  Laterality: Right;    Family History  Problem Relation Age of Onset   Arthritis Mother    Lung cancer Father    Diabetes Brother        age 41   Birth defects Brother    Arthritis Maternal Grandmother    Heart disease Maternal Grandmother    Heart attack Paternal Grandmother    Colon cancer Neg Hx    Stomach cancer Neg Hx     Social History   Socioeconomic History   Marital status: Married    Spouse name: Not on file   Number of children: Not on file   Years of education: Not on file   Highest education level: Not  on file  Occupational History    Employer: [employer].  Social Needs   Emergency planning/management officer strain: Not on file   Food insecurity    Worry: Not on file    Inability: Not on file   Transportation needs    Medical: Not on file  Non-medical: Not on file  Tobacco Use   Smoking status: Never Smoker   Smokeless tobacco: Never Used  Substance and Sexual Activity   Alcohol use: Yes    Comment: maybe 2 glasses a week   Drug use: No   Sexual activity: Yes    Partners: Male  Lifestyle   Physical activity    Days per week: Not on file    Minutes per session: Not on file   Stress: Not on file  Relationships   Social connections    Talks on phone: Not on file    Gets together: Not on file    Attends religious service: Not on file    Active member of club or organization: Not on file    Attends meetings of clubs or organizations: Not on file    Relationship status: Not on file   Intimate partner violence    Fear of current or ex partner: Not on file    Emotionally abused: Not on file    Physically abused: Not on file    Forced sexual activity: Not on file  Other Topics Concern   Not on file  Social History Narrative   Married   Orig from [place], [place]   Has been Event organiser of Chief Strategy Officer   At least 1 daughter   Second home [employer] - enjoys yoga   The PMH, [place], Social History, Family History, Medications, and allergies have been reviewed in [facility], and have been updated if relevant.   Review of Systems  Constitutional: Negative.   HENT: Negative.   Respiratory: Negative.   Cardiovascular: Negative.   Gastrointestinal: Negative.   Musculoskeletal: Negative.   Skin: Negative.   Psychiatric/Behavioral: Positive for sleep disturbance. Negative for agitation, behavioral problems, confusion, decreased concentration, dysphoric mood, hallucinations, self-injury and suicidal ideas. The patient is nervous/anxious. The patient is not  hyperactive.   All other systems reviewed and are negative.      Objective:    Pulse 75    Temp 98.6 F (37 C) (Oral)    Ht 5\' 4"  (1.626 m)    Wt 145 lb 6.4 oz (66 kg)    SpO2 96%    BMI 24.96 kg/m    Physical Exam    General:  Well-developed,well-nourished,in no acute distress; alert,appropriate and cooperative throughout examination Head:  normocephalic and atraumatic.   Eyes:  vision grossly intact, PERRL Ears:  R ear normal and L ear normal externally, TMs clear bilaterally Nose:  no external deformity.   Mouth:  good dentition.   Neck:  No deformities, masses, or tenderness noted. Lungs:  Normal respiratory effort, chest expands symmetrically. Lungs are clear to auscultation, no crackles or wheezes. Heart:  Normal rate and regular rhythm. S1 and S2 normal without gallop, murmur, click, rub or other extra sounds. Abdomen:  Bowel sounds positive,abdomen soft and non-tender without masses, organomegaly or hernias noted. Msk:  No deformity or scoliosis noted of thoracic or lumbar spine.   Extremities:  No clubbing, cyanosis, edema, or deformity noted with normal full range of motion of all joints.   Neurologic:  alert & oriented X3 and gait normal.   Skin:  Intact without suspicious lesions or rashes Cervical Nodes:  No lymphadenopathy noted Axillary Nodes:  No palpable lymphadenopathy Psych:  Cognition and judgment appear intact. Alert and cooperative with normal attention span and concentration. No apparent delusions, illusions, hallucinations      Assessment & Plan:   History of 2019 novel coronavirus disease (COVID-19)  Hyperlipidemia, unspecified hyperlipidemia type  Hyperglycemia  Vitamin D deficiency - Plan: Vitamin D (25 hydroxy)  Status post cataract extraction and insertion of intraocular lens, unspecified laterality  Spasmodic dysphonia  GAD (generalized anxiety disorder) No follow-ups on file.

## 2019-02-15 NOTE — Assessment & Plan Note (Signed)
Followed by Dr. Patrice Paradise at [facility].  Has an appointment scheduled on 03/19/19.

## 2019-02-15 NOTE — Assessment & Plan Note (Signed)
Diet controlled- repeat labs today.

## 2019-02-15 NOTE — Assessment & Plan Note (Signed)
Check Vitamin D today 

## 2019-02-15 NOTE — Progress Notes (Unsigned)
   Subjective:   Patient ID: [name], female    DOB: 1945/12/08, 73 y.o.   MRN: [id number]  Renelda Kilian is a pleasant 73 y.o. year old female who presents to clinic today with Follow-up (Pt is here today for a F/U. She is due for her PNV-23 all other immunizations are UTD. Mammogram UTD next due on 4.1.2021.  BMD is due and pt can call [facility] to schedule. Her Colonoscopy is UTD next due on 8.106.2027. )  on 02/10/2019  HPI: ***  Review of Systems     Objective:    There were no vitals taken for this visit.   Physical Exam        Assessment & Plan:   GAD (generalized anxiety disorder)  Hyperlipidemia, unspecified hyperlipidemia type - Plan: Comp Met (CMET), Lipid Profile, CBC w/Diff, HgB A1c, TSH  Hyperglycemia - Plan: Comp Met (CMET), Lipid Profile, CBC w/Diff, HgB A1c, TSH No follow-ups on file.

## 2019-02-17 ENCOUNTER — Telehealth: Payer: Self-pay

## 2019-02-17 NOTE — Telephone Encounter (Signed)
Travel or Contacts:   Any travel in the past 2 weeks? no  Have you came in contact with anyone who has Covid?no  Have you had a positive Covid test? If so, when?yes, the last week in July  Fever >100.80F []   Yes [x]   No []   Unknown  Chills []   Yes [x]   No []   Unknown  Muscle aches (myalgia) []   Yes [x]   No []   Unknown  Runny nose (rhinorrhea) []   Yes [x]   No []   Unknown  Sore throat []   Yes [x]   No []   Unknown Cough (new onset or worsening of chronic cough) []   Yes [x]   No []   Unknown  Shortness of breath (dyspnea) []   Yes [x]   No []   Unknown Nausea or vomiting []   Yes [x]   No []   Unknown  Headache []   Yes [x]   No []   Unknown  Abdominal pain  []   Yes [x]   No []   Unknown  Diarrhea (?3 loose/looser than normal stools/24hr period) []   Yes [x]   No []   Unknown Other, s:pecify

## 2019-02-18 ENCOUNTER — Encounter: Payer: Self-pay | Admitting: Family Medicine

## 2019-02-18 ENCOUNTER — Ambulatory Visit (INDEPENDENT_AMBULATORY_CARE_PROVIDER_SITE_OTHER): Payer: Medicare Other | Admitting: Family Medicine

## 2019-02-18 ENCOUNTER — Other Ambulatory Visit: Payer: Self-pay

## 2019-02-18 VITALS — HR 75 | Temp 98.6°F | Ht 64.0 in | Wt 145.4 lb

## 2019-02-18 DIAGNOSIS — Z961 Presence of intraocular lens: Secondary | ICD-10-CM | POA: Diagnosis not present

## 2019-02-18 DIAGNOSIS — F411 Generalized anxiety disorder: Secondary | ICD-10-CM

## 2019-02-18 DIAGNOSIS — J383 Other diseases of vocal cords: Secondary | ICD-10-CM | POA: Diagnosis not present

## 2019-02-18 DIAGNOSIS — F4310 Post-traumatic stress disorder, unspecified: Secondary | ICD-10-CM | POA: Insufficient documentation

## 2019-02-18 DIAGNOSIS — E559 Vitamin D deficiency, unspecified: Secondary | ICD-10-CM

## 2019-02-18 DIAGNOSIS — R739 Hyperglycemia, unspecified: Secondary | ICD-10-CM

## 2019-02-18 DIAGNOSIS — Z8616 Personal history of COVID-19: Secondary | ICD-10-CM

## 2019-02-18 DIAGNOSIS — Z9849 Cataract extraction status, unspecified eye: Secondary | ICD-10-CM | POA: Diagnosis not present

## 2019-02-18 DIAGNOSIS — E785 Hyperlipidemia, unspecified: Secondary | ICD-10-CM

## 2019-02-18 DIAGNOSIS — Z0184 Encounter for antibody response examination: Secondary | ICD-10-CM

## 2019-02-18 DIAGNOSIS — Z8619 Personal history of other infectious and parasitic diseases: Secondary | ICD-10-CM

## 2019-02-18 LAB — LIPID PANEL
Cholesterol: 252 mg/dL — ABNORMAL HIGH (ref 0–200)
HDL: 85 mg/dL (ref >39.00)
LDL Cholesterol: 138 mg/dL — ABNORMAL HIGH (ref 0–99)
NonHDL: 166.83
Total CHOL/HDL Ratio: 3
Triglycerides: 144 mg/dL (ref 0.0–149.0)
VLDL: 28.8 mg/dL (ref 0.0–40.0)

## 2019-02-18 LAB — CBC WITH DIFFERENTIAL/PLATELET
Basophils Absolute: 0.1 10*3/uL (ref 0.0–0.1)
Basophils Relative: 0.8 % (ref 0.0–3.0)
Eosinophils Absolute: 0.1 10*3/uL (ref 0.0–0.7)
Eosinophils Relative: 1.8 % (ref 0.0–5.0)
HCT: 42 % (ref 36.0–46.0)
Hemoglobin: 14.1 g/dL (ref 12.0–15.0)
Lymphocytes Relative: 25.2 % (ref 12.0–46.0)
Lymphs Abs: 1.8 10*3/uL (ref 0.7–4.0)
MCHC: 33.6 g/dL (ref 30.0–36.0)
MCV: 92.5 fl (ref 78.0–100.0)
Monocytes Absolute: 0.5 10*3/uL (ref 0.1–1.0)
Monocytes Relative: 6.9 % (ref 3.0–12.0)
Neutro Abs: 4.7 10*3/uL (ref 1.4–7.7)
Neutrophils Relative %: 65.3 % (ref 43.0–77.0)
Platelets: 252 10*3/uL (ref 150.0–400.0)
RBC: 4.54 Mil/uL (ref 3.87–5.11)
RDW: 13 % (ref 11.5–15.5)
WBC: 7.2 10*3/uL (ref 4.0–10.5)

## 2019-02-18 LAB — COMPREHENSIVE METABOLIC PANEL
ALT: 14 U/L (ref 0–35)
AST: 15 U/L (ref 0–37)
Albumin: 4.4 g/dL (ref 3.5–5.2)
Alkaline Phosphatase: 61 U/L (ref 39–117)
BUN: 13 mg/dL (ref 6–23)
CO2: 29 mEq/L (ref 19–32)
Calcium: 9.7 mg/dL (ref 8.4–10.5)
Chloride: 102 mEq/L (ref 96–112)
Creatinine, Ser: 0.52 mg/dL (ref 0.40–1.20)
GFR: 114.98 mL/min (ref >60.00)
Glucose, Bld: 115 mg/dL — ABNORMAL HIGH (ref 70–99)
Potassium: 4.3 mEq/L (ref 3.5–5.1)
Sodium: 140 mEq/L (ref 135–145)
Total Bilirubin: 0.7 mg/dL (ref 0.2–1.2)
Total Protein: 7 g/dL (ref 6.0–8.3)

## 2019-02-18 LAB — TSH: TSH: 1.86 u[IU]/mL (ref 0.35–4.50)

## 2019-02-18 LAB — VITAMIN D 25 HYDROXY (VIT D DEFICIENCY, FRACTURES): VITD: 55.55 ng/mL (ref 30.00–100.00)

## 2019-02-18 LAB — HEMOGLOBIN A1C: Hgb A1c MFr Bld: 5.8 % (ref 4.6–6.5)

## 2019-02-18 MED ORDER — VENLAFAXINE HCL ER 75 MG PO CP24: 75.0000 mg | ORAL_CAPSULE | Freq: Every day | ORAL | 3 refills | Status: DC

## 2019-02-18 NOTE — Assessment & Plan Note (Signed)
Recovered well now with some PTSD- see below.

## 2019-02-18 NOTE — Patient Instructions (Addendum)
.  Great to see you. I will call you with your lab results from today and you can view them online.   We are restarting effexor XR 75 mg daily.  When we call about your labs, ask [place] if we have pneumovax (pneumonia vaccine booster).  Say hello to your husband for me.  Happy birthday.

## 2019-02-18 NOTE — Assessment & Plan Note (Signed)
Due to COVID 19 and watching her husband so ill.  We agreed to restart Effexor 75 mg XR daily. She will update me in a few weeks.

## 2019-02-20 LAB — VARICELLA ZOSTER ABS, IGG/IGM
Varicella IgM: <0.91 index (ref 0.00–0.90)
Varicella zoster IgG: 928 index (ref >165)

## 2019-02-24 ENCOUNTER — Telehealth: Payer: Self-pay | Admitting: Behavioral Health

## 2019-02-24 NOTE — Telephone Encounter (Signed)
Dr. Deborra Medina - Is it ok to administer Pneumovax 23? Please advise. Thanks.

## 2019-02-25 ENCOUNTER — Ambulatory Visit (INDEPENDENT_AMBULATORY_CARE_PROVIDER_SITE_OTHER): Payer: Medicare Other

## 2019-02-25 ENCOUNTER — Other Ambulatory Visit: Payer: Self-pay

## 2019-02-25 DIAGNOSIS — Z23 Encounter for immunization: Secondary | ICD-10-CM | POA: Diagnosis not present

## 2019-02-25 NOTE — Telephone Encounter (Signed)
Yes okay to administer pneumovax to pt.

## 2019-02-25 NOTE — Progress Notes (Signed)
Patient came into the office today to receive the pneumococcal 23 vaccination. Given in right deltoid. Patient tolerated injection well.

## 2019-03-19 DIAGNOSIS — J385 Laryngeal spasm: Secondary | ICD-10-CM | POA: Diagnosis not present

## 2019-03-21 ENCOUNTER — Encounter: Payer: Self-pay | Admitting: Family Medicine

## 2019-03-21 ENCOUNTER — Ambulatory Visit (INDEPENDENT_AMBULATORY_CARE_PROVIDER_SITE_OTHER): Payer: Medicare Other

## 2019-03-21 ENCOUNTER — Other Ambulatory Visit: Payer: Self-pay

## 2019-03-21 ENCOUNTER — Ambulatory Visit (INDEPENDENT_AMBULATORY_CARE_PROVIDER_SITE_OTHER): Payer: Medicare Other | Admitting: Family Medicine

## 2019-03-21 VITALS — BP 142/76 | HR 82 | Temp 97.6°F | Wt 141.8 lb

## 2019-03-21 DIAGNOSIS — M25532 Pain in left wrist: Secondary | ICD-10-CM

## 2019-03-21 DIAGNOSIS — S62102A Fracture of unspecified carpal bone, left wrist, initial encounter for closed fracture: Secondary | ICD-10-CM | POA: Diagnosis not present

## 2019-03-21 DIAGNOSIS — S62109A Fracture of unspecified carpal bone, unspecified wrist, initial encounter for closed fracture: Secondary | ICD-10-CM | POA: Insufficient documentation

## 2019-03-21 DIAGNOSIS — S52592A Other fractures of lower end of left radius, initial encounter for closed fracture: Secondary | ICD-10-CM | POA: Diagnosis not present

## 2019-03-21 MED ORDER — TRAMADOL HCL 50 MG PO TABS: 50.0000 mg | ORAL_TABLET | Freq: Three times a day (TID) | ORAL | 0 refills | Status: AC | PRN

## 2019-03-21 NOTE — Progress Notes (Signed)
Tamara Pope - 73 y.o. female MRN [id number]  Date of birth: 02/20/1946  Subjective Chief Complaint  Patient presents with  . Wrist Pain    Patient is here today C/O lef wrist pain. DOI: 12.18.20.  She fell on it. After speaking with Dr. Deborra Medina she was advised to come in for an appointment. She states that she was helping her neighbor get her trash cans in and she fell on her knees and wrist as well. Pain level is down to a 3 since she is icing it and taken 3 Ibuprofen. As soon as she takes the ice off and tries to move it the pain becomes severe. Unable to move fingers.    HPI Tamara Pope is a 73 y.o. female here today with complaint of L wrist pain.  She was helping a neighbor move trash cans and fall onto her knees and hand.  She reports that her wrist folded beneath her.  She had immediate swelling and decreased ROM.  She has been icing and took ibuprofen and pain has improved some.  If she removes the ice and moves her wrist the pain worsens again.  She denies numbness or tingling into her fingers.    ROS:  A comprehensive ROS was completed and negative except as noted per HPI No Known Allergies  Past Medical History:  Diagnosis Date  . Allergy   . Arthritis   . Dysphonia, spasmodic   . GERD (gastroesophageal reflux disease)   . History of DVT (deep vein thrombosis)    JAN 2002-- S/P BREAST IMPLANTS  RIGHT UPPER ARM (AXILLARY/ SUBCLAVIN VEIN)  . PMB (postmenopausal bleeding)     Past Surgical History:  Procedure Laterality Date  . APPENDECTOMY  AS  CHILD  . AUGMENTATION MAMMAPLASTY Bilateral    2016  . BILATERAL BREAST LIFT AND IMPLANT REDUCTION  SEPT 2014  . BREAST ENHANCEMENT SURGERY Bilateral NOV 2001  . CATARACT EXTRACTION W/ INTRAOCULAR LENS  IMPLANT, BILATERAL    . DILATATION & CURRETTAGE/HYSTEROSCOPY WITH RESECTOCOPE N/A 04/18/2013   Procedure: DILATATION & CURETTAGE/HYSTEROSCOPY ;  Surgeon: [name], MD;  Location: [facility];   Service: Gynecology;  Laterality: N/A;  . KNEE ARTHROSCOPY W/ MENISCECTOMY Right 2011  . ROTATOR CUFF REPAIR Right 2005  . TONSILLECTOMY  AS CHILD  . TOTAL KNEE ARTHROPLASTY Right 05/03/2015   Procedure: RIGHT TOTAL KNEE ARTHROPLASTY;  Surgeon: Vickey Huger, MD;  Location: [facility];  Service: Orthopedics;  Laterality: Right;    Social History   Socioeconomic History  . Marital status: Married    Spouse name: Not on file  . Number of children: Not on file  . Years of education: Not on file  . Highest education level: Not on file  Occupational History    Employer: [employer].  Tobacco Use  . Smoking status: Never Smoker  . Smokeless tobacco: Never Used  Substance and Sexual Activity  . Alcohol use: Yes    Comment: maybe 2 glasses a week  . Drug use: No  . Sexual activity: Yes    Partners: Male  Other Topics Concern  . Not on file  Social History Narrative   Married   Orig from [place], [place]   Has been Paediatric nurse   At least 1 daughter   Second home [employer] - enjoys yoga   Social Determinants of Health   Financial Resource Strain:   . Difficulty of Paying Living Expenses: Not on [employer]  Insecurity:   . Worried About Charity fundraiser in the Last Year: Not on file  . Ran Out of Food in the Last Year: Not on file  Transportation Needs:   . Lack of Transportation (Medical): Not on file  . Lack of Transportation (Non-Medical): Not on file  Physical Activity:   . Days of Exercise per Week: Not on file  . Minutes of Exercise per Session: Not on file  Stress:   . Feeling of Stress : Not on file  Social Connections:   . Frequency of Communication with Friends and Family: Not on file  . Frequency of Social Gatherings with Friends and Family: Not on file  . Attends Religious Services: Not on file  . Active Member of Clubs or Organizations: Not on file  . Attends Archivist Meetings: Not on file  . Marital Status:  Not on file    Family History  Problem Relation Age of Onset  . Arthritis Mother   . Lung cancer Father   . Diabetes Brother        age 55  . Birth defects Brother   . Arthritis Maternal Grandmother   . Heart disease Maternal Grandmother   . Heart attack Paternal Grandmother   . Colon cancer Neg Hx   . Stomach cancer Neg Hx     Health Maintenance  Topic Date Due  . TETANUS/TDAP  11/04/2019  . COLONOSCOPY  11/16/2025  . INFLUENZA VACCINE  Completed  . DEXA SCAN  Completed  . Hepatitis C Screening  Completed  . PNA vac Low Risk Adult  Completed    ----------------------------------------------------------------------------------------------------------------------------------------------------------------------------------------------------------------- Physical Exam BP (!) 142/76 (BP Location: Right Arm, Patient Position: Sitting, Cuff Size: Normal)   Pulse 82   Temp 97.6 F (36.4 C) (Temporal)   Wt 141 lb 12.8 oz (64.3 kg)   SpO2 95%   BMI 24.34 kg/m   Physical Exam Constitutional:      Appearance: Normal appearance.  HENT:     Head: Normocephalic and atraumatic.  Eyes:     General: No scleral icterus. Musculoskeletal:     Comments: Swelling of radial aspect of wrist with ttp.  Decreased ROM of wrist.  Able to move fingers.  NVI  Skin:    General: Skin is warm and dry.  Neurological:     General: No focal deficit present.     Mental Status: She is alert.  Psychiatric:        Mood and Affect: Mood normal.        Behavior: Behavior normal.     ------------------------------------------------------------------------------------------------------------------------------------------------------------------------------------------------------------------- Assessment and Plan  Wrist fracture Xrays independently reviewed and there appears to be a fracture involving the distal L radius as well as possibly the L scaphoid.  These to not appear to be displaced.  Will  await radiology over-read as well.  Recommend that she see orthopedics.  Given names of local hand specialists.  She may also visit the orthopedic urgent care.  Recommend thumb spica brace to help immobilize wrist given possible scaphoid involvement as well.  Tramadol for pain control

## 2019-03-21 NOTE — Assessment & Plan Note (Addendum)
Xrays independently reviewed and there appears to be a fracture involving the distal L radius as well as possibly the L scaphoid.  These to not appear to be displaced.  Will await radiology over-read as well.  Recommend that she see orthopedics.  Given names of local hand specialists.  She may also visit the orthopedic urgent care.  Recommend thumb spica brace to help immobilize wrist given possible scaphoid involvement as well.  Tramadol for pain control

## 2019-03-21 NOTE — Patient Instructions (Signed)
Hand Specialists: Dr. Amedeo Plenty or Caralyn Guile with [facility]  Thumb Spica brace:

## 2019-03-24 ENCOUNTER — Encounter: Payer: Self-pay | Admitting: Family Medicine

## 2019-03-25 DIAGNOSIS — M25532 Pain in left wrist: Secondary | ICD-10-CM | POA: Diagnosis not present

## 2019-03-25 DIAGNOSIS — S52532A Colles' fracture of left radius, initial encounter for closed fracture: Secondary | ICD-10-CM | POA: Diagnosis not present

## 2019-04-02 DIAGNOSIS — Z20828 Contact with and (suspected) exposure to other viral communicable diseases: Secondary | ICD-10-CM | POA: Diagnosis not present

## 2019-04-10 DIAGNOSIS — S52532A Colles' fracture of left radius, initial encounter for closed fracture: Secondary | ICD-10-CM | POA: Diagnosis not present

## 2019-04-29 ENCOUNTER — Ambulatory Visit: Payer: Medicare Other

## 2019-05-01 DIAGNOSIS — S52532D Colles' fracture of left radius, subsequent encounter for closed fracture with routine healing: Secondary | ICD-10-CM | POA: Diagnosis not present

## 2019-05-05 DIAGNOSIS — Z23 Encounter for immunization: Secondary | ICD-10-CM | POA: Diagnosis not present

## 2019-05-08 ENCOUNTER — Ambulatory Visit: Payer: Medicare Other

## 2019-05-10 ENCOUNTER — Ambulatory Visit: Payer: Medicare Other

## 2019-05-16 ENCOUNTER — Ambulatory Visit: Payer: Medicare Other

## 2019-05-27 DIAGNOSIS — Z23 Encounter for immunization: Secondary | ICD-10-CM | POA: Diagnosis not present

## 2019-05-29 DIAGNOSIS — S52532D Colles' fracture of left radius, subsequent encounter for closed fracture with routine healing: Secondary | ICD-10-CM | POA: Diagnosis not present

## 2019-06-09 ENCOUNTER — Other Ambulatory Visit: Payer: Self-pay

## 2019-06-10 ENCOUNTER — Encounter: Payer: Self-pay | Admitting: Nurse Practitioner

## 2019-06-10 ENCOUNTER — Other Ambulatory Visit: Payer: Self-pay

## 2019-06-10 ENCOUNTER — Ambulatory Visit (INDEPENDENT_AMBULATORY_CARE_PROVIDER_SITE_OTHER): Payer: Medicare Other | Admitting: Nurse Practitioner

## 2019-06-10 VITALS — BP 112/70 | HR 77 | Temp 97.5°F | Ht 64.0 in | Wt 139.0 lb

## 2019-06-10 DIAGNOSIS — N3000 Acute cystitis without hematuria: Secondary | ICD-10-CM | POA: Diagnosis not present

## 2019-06-10 LAB — POCT URINALYSIS DIPSTICK
Bilirubin, UA: NEGATIVE
Blood, UA: 10
Glucose, UA: NEGATIVE
Ketones, UA: NEGATIVE
Nitrite, UA: NEGATIVE
Protein, UA: POSITIVE — AB
Spec Grav, UA: >=1.03 — AB (ref 1.010–1.025)
Urobilinogen, UA: 0.2 E.U./dL
pH, UA: 6 (ref 5.0–8.0)

## 2019-06-10 MED ORDER — SULFAMETHOXAZOLE-TRIMETHOPRIM 800-160 MG PO TABS: 1.0000 | ORAL_TABLET | Freq: Two times a day (BID) | ORAL | 0 refills | Status: DC

## 2019-06-10 NOTE — Progress Notes (Signed)
Subjective:  Patient ID: Tamara Pope, female    DOB: 1945-08-03  Age: 74 y.o. MRN: [id number]  CC: Urinary Tract Infection (pt stated no burning or frequency but pressure//no back pain//pt started having symptoms Sunday night or Monday)  Urinary Tract Infection  This is a new problem. The current episode started in the past 7 days. The problem occurs every urination. The problem has been gradually worsening. The quality of the pain is described as burning. There has been no fever. She is not sexually active. There is no history of pyelonephritis. Associated symptoms include frequency and urgency. Pertinent negatives include no chills, discharge, flank pain, hematuria, hesitancy, nausea, possible pregnancy, sweats or vomiting. She has tried increased fluids for the symptoms. The treatment provided no relief. Her past medical history is significant for urinary stasis. There is no history of catheterization, kidney stones, recurrent UTIs, a single kidney or a urological procedure.   Reviewed past Medical, Social and Family history today.  Outpatient Medications Prior to Visit  Medication Sig Dispense Refill  . Biotin 5000 MCG TABS Take 1 tablet by mouth daily.    . Calcium Carb-Cholecalciferol (CALTRATE 600+D3) 600-800 MG-UNIT TABS Take 1 tablet by mouth 2 (two) times daily.    . cetirizine (ZYRTEC) 10 MG tablet Take 10 mg by mouth daily.    . Cholecalciferol (VITAMIN D3) 2000 units TABS Take 1 tablet by mouth daily.    . clonazePAM (KLONOPIN) 0.5 MG tablet     . fluticasone (FLONASE) 50 MCG/ACT nasal spray Place 1 spray into both nostrils daily as needed for allergies or rhinitis.    . Probiotic Product (ALIGN) 4 MG CAPS Take 1 capsule by mouth daily.    Marland Kitchen augmented betamethasone dipropionate (DIPROLENE-AF) 0.05 % cream APPLY A THIN LAYER AA 2-3 TIMES DAILY     No facility-administered medications prior to visit.    ROS See HPI  Objective:  BP 112/70   Pulse 77   Temp (!)  97.5 F (36.4 C) (Tympanic)   Ht 5\' 4"  (1.626 m)   Wt 139 lb (63 kg)   SpO2 97%   BMI 23.86 kg/m   BP Readings from Last 3 Encounters:  06/10/19 112/70  03/21/19 (!) 142/76  10/22/18 106/70    Wt Readings from Last 3 Encounters:  06/10/19 139 lb (63 kg)  03/21/19 141 lb 12.8 oz (64.3 kg)  02/18/19 145 lb 6.4 oz (66 kg)    Physical Exam Vitals reviewed.  Cardiovascular:     Pulses: Normal pulses.  Pulmonary:     Effort: Pulmonary effort is normal.  Abdominal:     General: Bowel sounds are normal. There is no distension.     Palpations: Abdomen is soft.     Tenderness: There is no abdominal tenderness. There is no right CVA tenderness or left CVA tenderness.  Musculoskeletal:     Right lower leg: No edema.     Left lower leg: No edema.  Neurological:     Mental Status: She is alert.    Lab Results  Component Value Date   WBC 7.2 02/18/2019   HGB 14.1 02/18/2019   HCT 42.0 02/18/2019   PLT 252.0 02/18/2019   GLUCOSE 115 (H) 02/18/2019   CHOL 252 (H) 02/18/2019   TRIG 144.0 02/18/2019   HDL 85.00 02/18/2019   LDLCALC 138 (H) 02/18/2019   ALT 14 02/18/2019   AST 15 02/18/2019   NA 140 02/18/2019   K 4.3 02/18/2019   CL 102 02/18/2019  CREATININE 0.52 02/18/2019   BUN 13 02/18/2019   CO2 29 02/18/2019   TSH 1.86 02/18/2019   INR 1.09 04/19/2015   HGBA1C 5.8 02/18/2019   Assessment & Plan:  This visit occurred during the SARS-CoV-2 public health emergency.  Safety protocols were in place, including screening questions prior to the visit, additional usage of staff PPE, and extensive cleaning of exam room while observing appropriate contact time as indicated for disinfecting solutions.   [place] was seen today for urinary tract infection.  Diagnoses and all orders for this visit:  Acute cystitis without hematuria -     POCT urinalysis dipstick -     sulfamethoxazole-trimethoprim (BACTRIM DS) 800-160 MG tablet; Take 1 tablet by mouth 2 (two) times daily. With  food   I am having [place] start on sulfamethoxazole-trimethoprim. I am also having her maintain her cetirizine, fluticasone, Biotin, Align, Vitamin D3, Calcium Carb-Cholecalciferol, clonazePAM, and augmented betamethasone dipropionate.  Meds ordered this encounter  Medications  . sulfamethoxazole-trimethoprim (BACTRIM DS) 800-160 MG tablet    Sig: Take 1 tablet by mouth 2 (two) times daily. With food    Dispense:  10 tablet    Refill:  0    Order Specific Question:   Supervising Provider    Answer:   Ronnald Nian [id number]    Problem List Items Addressed This Visit    None    Visit Diagnoses    Acute cystitis without hematuria    -  Primary   Relevant Medications   sulfamethoxazole-trimethoprim (BACTRIM DS) 800-160 MG tablet   Other Relevant Orders   POCT urinalysis dipstick (Completed)       Follow-up: No follow-ups on file.  Wilfred Lacy, NP

## 2019-06-10 NOTE — Patient Instructions (Signed)
Maintain adequate oral hydration.  Start oral abx. Call office if not improvement in 3-5days.

## 2019-06-11 DIAGNOSIS — Z961 Presence of intraocular lens: Secondary | ICD-10-CM | POA: Diagnosis not present

## 2019-06-11 DIAGNOSIS — Z9889 Other specified postprocedural states: Secondary | ICD-10-CM | POA: Diagnosis not present

## 2019-06-11 DIAGNOSIS — H04123 Dry eye syndrome of bilateral lacrimal glands: Secondary | ICD-10-CM | POA: Diagnosis not present

## 2019-06-11 DIAGNOSIS — Z9841 Cataract extraction status, right eye: Secondary | ICD-10-CM | POA: Diagnosis not present

## 2019-06-11 DIAGNOSIS — Z9842 Cataract extraction status, left eye: Secondary | ICD-10-CM | POA: Diagnosis not present

## 2019-06-11 DIAGNOSIS — H26491 Other secondary cataract, right eye: Secondary | ICD-10-CM | POA: Diagnosis not present

## 2019-07-08 DIAGNOSIS — L309 Dermatitis, unspecified: Secondary | ICD-10-CM | POA: Diagnosis not present

## 2019-07-08 DIAGNOSIS — L570 Actinic keratosis: Secondary | ICD-10-CM | POA: Diagnosis not present

## 2019-07-08 DIAGNOSIS — L820 Inflamed seborrheic keratosis: Secondary | ICD-10-CM | POA: Diagnosis not present

## 2019-07-08 DIAGNOSIS — L578 Other skin changes due to chronic exposure to nonionizing radiation: Secondary | ICD-10-CM | POA: Diagnosis not present

## 2019-07-08 DIAGNOSIS — H00024 Hordeolum internum left upper eyelid: Secondary | ICD-10-CM | POA: Diagnosis not present

## 2019-07-08 DIAGNOSIS — L821 Other seborrheic keratosis: Secondary | ICD-10-CM | POA: Diagnosis not present

## 2019-07-16 DIAGNOSIS — Z48810 Encounter for surgical aftercare following surgery on the sense organs: Secondary | ICD-10-CM | POA: Diagnosis not present

## 2019-07-16 DIAGNOSIS — H04123 Dry eye syndrome of bilateral lacrimal glands: Secondary | ICD-10-CM | POA: Diagnosis not present

## 2019-07-16 DIAGNOSIS — H26493 Other secondary cataract, bilateral: Secondary | ICD-10-CM | POA: Diagnosis not present

## 2019-08-20 DIAGNOSIS — Z48810 Encounter for surgical aftercare following surgery on the sense organs: Secondary | ICD-10-CM | POA: Diagnosis not present

## 2019-08-20 DIAGNOSIS — H04123 Dry eye syndrome of bilateral lacrimal glands: Secondary | ICD-10-CM | POA: Diagnosis not present

## 2019-08-26 ENCOUNTER — Encounter: Payer: Self-pay | Admitting: Family

## 2019-08-26 ENCOUNTER — Ambulatory Visit (INDEPENDENT_AMBULATORY_CARE_PROVIDER_SITE_OTHER): Payer: Medicare Other | Admitting: Family

## 2019-08-26 ENCOUNTER — Other Ambulatory Visit: Payer: Self-pay

## 2019-08-26 VITALS — BP 126/82 | HR 81 | Temp 97.9°F | Ht 64.0 in | Wt 142.0 lb

## 2019-08-26 DIAGNOSIS — N390 Urinary tract infection, site not specified: Secondary | ICD-10-CM

## 2019-08-26 DIAGNOSIS — R350 Frequency of micturition: Secondary | ICD-10-CM | POA: Diagnosis not present

## 2019-08-26 DIAGNOSIS — A499 Bacterial infection, unspecified: Secondary | ICD-10-CM | POA: Diagnosis not present

## 2019-08-26 LAB — POCT URINALYSIS DIPSTICK
Bilirubin, UA: NEGATIVE
Glucose, UA: NEGATIVE
Ketones, UA: NEGATIVE
Nitrite, UA: NEGATIVE
Protein, UA: NEGATIVE
Spec Grav, UA: 1.015 (ref 1.010–1.025)
Urobilinogen, UA: 0.2 E.U./dL
pH, UA: 6.5 (ref 5.0–8.0)

## 2019-08-26 MED ORDER — NITROFURANTOIN MONOHYD MACRO 100 MG PO CAPS: 100.0000 mg | ORAL_CAPSULE | Freq: Two times a day (BID) | ORAL | Status: DC

## 2019-08-26 MED ORDER — NITROFURANTOIN MONOHYD MACRO 100 MG PO CAPS: 100.0000 mg | ORAL_CAPSULE | Freq: Two times a day (BID) | ORAL | 0 refills | Status: AC

## 2019-08-26 NOTE — Patient Instructions (Signed)
Urinary Tract Infection, Adult A urinary tract infection (UTI) is an infection of any part of the urinary tract. The urinary tract includes:  The kidneys.  The ureters.  The bladder.  The urethra. These organs make, store, and get rid of pee (urine) in the body. What are the causes? This is caused by germs (bacteria) in your genital area. These germs grow and cause swelling (inflammation) of your urinary tract. What increases the risk? You are more likely to develop this condition if:  You have a small, thin tube (catheter) to drain pee.  You cannot control when you pee or poop (incontinence).  You are female, and: ? You use these methods to prevent pregnancy:  A medicine that kills sperm (spermicide).  A device that blocks sperm (diaphragm). ? You have low levels of a female hormone (estrogen). ? You are pregnant.  You have genes that add to your risk.  You are sexually active.  You take antibiotic medicines.  You have trouble peeing because of: ? A prostate that is bigger than normal, if you are female. ? A blockage in the part of your body that drains pee from the bladder (urethra). ? A kidney stone. ? A nerve condition that affects your bladder (neurogenic bladder). ? Not getting enough to drink. ? Not peeing often enough.  You have other conditions, such as: ? Diabetes. ? A weak disease-fighting system (immune system). ? Sickle cell disease. ? Gout. ? Injury of the spine. What are the signs or symptoms? Symptoms of this condition include:  Needing to pee right away (urgently).  Peeing often.  Peeing small amounts often.  Pain or burning when peeing.  Blood in the pee.  Pee that smells bad or not like normal.  Trouble peeing.  Pee that is cloudy.  Fluid coming from the vagina, if you are female.  Pain in the belly or lower back. Other symptoms include:  Throwing up (vomiting).  No urge to eat.  Feeling mixed up (confused).  Being tired  and grouchy (irritable).  A fever.  Watery poop (diarrhea). How is this treated? This condition may be treated with:  Antibiotic medicine.  Other medicines.  Drinking enough water. Follow these instructions at home:  Medicines  Take over-the-counter and prescription medicines only as told by your doctor.  If you were prescribed an antibiotic medicine, take it as told by your doctor. Do not stop taking it even if you start to feel better. General instructions  Make sure you: ? Pee until your bladder is empty. ? Do not hold pee for a long time. ? Empty your bladder after sex. ? Wipe from front to back after pooping if you are a female. Use each tissue one time when you wipe.  Drink enough fluid to keep your pee pale yellow.  Keep all follow-up visits as told by your doctor. This is important. Contact a doctor if:  You do not get better after 1-2 days.  Your symptoms go away and then come back. Get help right away if:  You have very bad back pain.  You have very bad pain in your lower belly.  You have a fever.  You are sick to your stomach (nauseous).  You are throwing up. Summary  A urinary tract infection (UTI) is an infection of any part of the urinary tract.  This condition is caused by germs in your genital area.  There are many risk factors for a UTI. These include having a small, thin   tube to drain pee and not being able to control when you pee or poop.  Treatment includes antibiotic medicines for germs.  Drink enough fluid to keep your pee pale yellow. This information is not intended to replace advice given to you by your health care provider. Make sure you discuss any questions you have with your health care provider. Document Revised: 03/07/2018 Document Reviewed: 09/27/2017 Elsevier Patient Education  2020 Elsevier Inc.  

## 2019-08-26 NOTE — Progress Notes (Signed)
Acute Office Visit  Subjective:    Patient ID: Tamara Pope, female    DOB: 1945/04/25, 74 y.o.   MRN: [id number]  Chief Complaint  Patient presents with  . Urinary Tract Infection    Pt c/o persistent urge to urinate, pt explains she saw a drop of of blood in her urine x 3days ago when it started.    HPI Patient is in today for 74 year old female is in today with c/o urinary frequency and urgency x 3 days. She denies any fever, chills  Past Medical History:  Diagnosis Date  . Allergy   . Arthritis   . Dysphonia, spasmodic   . GERD (gastroesophageal reflux disease)   . History of DVT (deep vein thrombosis)    JAN 2002-- S/P BREAST IMPLANTS  RIGHT UPPER ARM (AXILLARY/ SUBCLAVIN VEIN)  . PMB (postmenopausal bleeding)     Past Surgical History:  Procedure Laterality Date  . APPENDECTOMY  AS  CHILD  . AUGMENTATION MAMMAPLASTY Bilateral    2016  . BILATERAL BREAST LIFT AND IMPLANT REDUCTION  SEPT 2014  . BREAST ENHANCEMENT SURGERY Bilateral NOV 2001  . CATARACT EXTRACTION W/ INTRAOCULAR LENS  IMPLANT, BILATERAL    . DILATATION & CURRETTAGE/HYSTEROSCOPY WITH RESECTOCOPE N/A 04/18/2013   Procedure: DILATATION & CURETTAGE/HYSTEROSCOPY ;  Surgeon: [name], MD;  Location: [facility];  Service: Gynecology;  Laterality: N/A;  . KNEE ARTHROSCOPY W/ MENISCECTOMY Right 2011  . ROTATOR CUFF REPAIR Right 2005  . TONSILLECTOMY  AS CHILD  . TOTAL KNEE ARTHROPLASTY Right 05/03/2015   Procedure: RIGHT TOTAL KNEE ARTHROPLASTY;  Surgeon: Vickey Huger, MD;  Location: [place];  Service: Orthopedics;  Laterality: Right;    Family History  Problem Relation Age of Onset  . Arthritis Mother   . Lung cancer Father   . Diabetes Brother        age 58  . Birth defects Brother   . Arthritis Maternal Grandmother   . Heart disease Maternal Grandmother   . Heart attack Paternal Grandmother   . Colon cancer Neg Hx   . Stomach cancer Neg Hx     Social History    Socioeconomic History  . Marital status: Married    Spouse name: Not on file  . Number of children: Not on file  . Years of education: Not on file  . Highest education level: Not on file  Occupational History    Employer: [employer].  Tobacco Use  . Smoking status: Never Smoker  . Smokeless tobacco: Never Used  Substance and Sexual Activity  . Alcohol use: Yes    Comment: maybe 2 glasses a week  . Drug use: No  . Sexual activity: Yes    Partners: Male  Other Topics Concern  . Not on file  Social History Narrative   Married   Orig from [place], [place]   Has been Paediatric nurse   At least 1 daughter   Second home [employer] - enjoys yoga   Social Determinants of Health   Financial Resource Strain:   . Difficulty of Paying Living Expenses:   Food Insecurity:   . Worried About Charity fundraiser in the Last Year:   . Arboriculturist in the Last Year:   Transportation Needs:   . Film/video editor (Medical):   Marland Kitchen Lack of Transportation (Non-Medical):   Physical Activity:   . Days of Exercise per Week:   . Minutes  of Exercise per Session:   Stress:   . Feeling of Stress :   Social Connections:   . Frequency of Communication with Friends and Family:   . Frequency of Social Gatherings with Friends and Family:   . Attends Religious Services:   . Active Member of Clubs or Organizations:   . Attends Archivist Meetings:   Marland Kitchen Marital Status:   Intimate Partner Violence:   . Fear of Current or Ex-Partner:   . Emotionally Abused:   Marland Kitchen Physically Abused:   . Sexually Abused:     Outpatient Medications Prior to Visit  Medication Sig Dispense Refill  . Biotin 5000 MCG TABS Take 1 tablet by mouth daily.    . Calcium Carb-Cholecalciferol (CALTRATE 600+D3) 600-800 MG-UNIT TABS Take 1 tablet by mouth 2 (two) times daily.    . cetirizine (ZYRTEC) 10 MG tablet Take 10 mg by mouth daily.    . Cholecalciferol (VITAMIN D3) 2000  units TABS Take 1 tablet by mouth daily.    . clonazePAM (KLONOPIN) 0.5 MG tablet     . fluticasone (FLONASE) 50 MCG/ACT nasal spray Place 1 spray into both nostrils daily as needed for allergies or rhinitis.    . Probiotic Product (ALIGN) 4 MG CAPS Take 1 capsule by mouth daily.    Marland Kitchen augmented betamethasone dipropionate (DIPROLENE-AF) 0.05 % cream APPLY A THIN LAYER AA 2-3 TIMES DAILY    . sulfamethoxazole-trimethoprim (BACTRIM DS) 800-160 MG tablet Take 1 tablet by mouth 2 (two) times daily. With food (Patient not taking: Reported on 08/26/2019) 10 tablet 0  . venlafaxine XR (EFFEXOR-XR) 75 MG 24 hr capsule Take 75 mg by mouth daily.     No facility-administered medications prior to visit.    No Known Allergies  Review of Systems  Respiratory: Negative.   Cardiovascular: Negative.   Genitourinary: Positive for frequency and urgency. Negative for dysuria.  Musculoskeletal: Negative.   Psychiatric/Behavioral: Negative.   All other systems reviewed and are negative.      Objective:    Physical Exam Constitutional:      Appearance: She is normal weight.  Cardiovascular:     Rate and Rhythm: Normal rate and regular rhythm.  Pulmonary:     Effort: Pulmonary effort is normal.     Breath sounds: Normal breath sounds.  Abdominal:     General: Bowel sounds are normal.  Musculoskeletal:        General: Normal range of motion.     Cervical back: Normal range of motion.  Skin:    General: Skin is warm and dry.  Neurological:     Mental Status: She is alert.  Psychiatric:        Mood and Affect: Mood normal.        Behavior: Behavior normal.     BP 126/82 (BP Location: Right Arm, Patient Position: Sitting, Cuff Size: Normal)   Pulse 81   Temp 97.9 F (36.6 C) (Temporal)   Ht 5\' 4"  (1.626 m)   Wt 142 lb (64.4 kg)   SpO2 97%   BMI 24.37 kg/m  Wt Readings from Last 3 Encounters:  08/26/19 142 lb (64.4 kg)  06/10/19 139 lb (63 kg)  03/21/19 141 lb 12.8 oz (64.3 kg)     There are no preventive care reminders to display for this patient.  There are no preventive care reminders to display for this patient.   Lab Results  Component Value Date   TSH 1.86 02/18/2019   Lab Results  Component Value Date   WBC 7.2 02/18/2019   HGB 14.1 02/18/2019   HCT 42.0 02/18/2019   MCV 92.5 02/18/2019   PLT 252.0 02/18/2019   Lab Results  Component Value Date   NA 140 02/18/2019   K 4.3 02/18/2019   CO2 29 02/18/2019   GLUCOSE 115 (H) 02/18/2019   BUN 13 02/18/2019   CREATININE 0.52 02/18/2019   BILITOT 0.7 02/18/2019   ALKPHOS 61 02/18/2019   AST 15 02/18/2019   ALT 14 02/18/2019   PROT 7.0 02/18/2019   ALBUMIN 4.4 02/18/2019   CALCIUM 9.7 02/18/2019   ANIONGAP 10 05/04/2015   GFR 114.98 02/18/2019   Lab Results  Component Value Date   CHOL 252 (H) 02/18/2019   Lab Results  Component Value Date   HDL 85.00 02/18/2019   Lab Results  Component Value Date   LDLCALC 138 (H) 02/18/2019   Lab Results  Component Value Date   TRIG 144.0 02/18/2019   Lab Results  Component Value Date   CHOLHDL 3 02/18/2019   Lab Results  Component Value Date   HGBA1C 5.8 02/18/2019       Assessment & Plan:   Problem List Items Addressed This Visit    None    Visit Diagnoses    Bacterial urinary tract infection    -  Primary   Relevant Orders   Urine Culture   Urinary frequency       Relevant Orders   POCT Urinalysis Dipstick      [place] was seen today for urinary tract infection.  Diagnoses and all orders for this visit:  Bacterial urinary tract infection -     Urine Culture -     nitrofurantoin (macrocrystal-monohydrate) (MACROBID) capsule 100 mg  Urinary frequency -     POCT Urinalysis Dipstick -     nitrofurantoin (macrocrystal-monohydrate) (MACROBID) capsule 100 mg    Kennyth Arnold, FNP

## 2019-08-26 NOTE — Addendum Note (Signed)
Addended by: Darral Dash on: 08/26/2019 04:38 PM   Modules accepted: Orders

## 2019-08-27 LAB — URINE CULTURE
MICRO NUMBER:: 10517157
SPECIMEN QUALITY:: ADEQUATE

## 2019-09-03 DIAGNOSIS — J385 Laryngeal spasm: Secondary | ICD-10-CM | POA: Diagnosis not present

## 2019-09-11 ENCOUNTER — Telehealth: Payer: Self-pay | Admitting: General Practice

## 2019-09-11 NOTE — Telephone Encounter (Signed)
Patient called and said she was seen by [facility] on 5/25 for a UTI and she still has symptoms. Patient stated that the antibiotics helped and wanted to see if another round be sent to [facility] in [place], [place]. CB is [phone]

## 2019-09-11 NOTE — Telephone Encounter (Signed)
Patient is calling back regarding previous symptoms. CB is [phone]

## 2019-09-12 ENCOUNTER — Encounter: Payer: Self-pay | Admitting: Nurse Practitioner

## 2019-09-12 ENCOUNTER — Other Ambulatory Visit: Payer: Self-pay

## 2019-09-12 ENCOUNTER — Telehealth (INDEPENDENT_AMBULATORY_CARE_PROVIDER_SITE_OTHER): Payer: Medicare Other | Admitting: Nurse Practitioner

## 2019-09-12 VITALS — BP 110/70 | Ht 64.0 in | Wt 140.0 lb

## 2019-09-12 DIAGNOSIS — R350 Frequency of micturition: Secondary | ICD-10-CM | POA: Diagnosis not present

## 2019-09-12 MED ORDER — PHENAZOPYRIDINE HCL 100 MG PO TABS: 100.0000 mg | ORAL_TABLET | Freq: Three times a day (TID) | ORAL | 0 refills | Status: DC | PRN

## 2019-09-12 MED ORDER — NITROFURANTOIN MONOHYD MACRO 100 MG PO CAPS: 100.0000 mg | ORAL_CAPSULE | Freq: Two times a day (BID) | ORAL | 0 refills | Status: AC

## 2019-09-12 NOTE — Progress Notes (Signed)
Virtual Visit via Video Note  I connected with@ on 09/12/19 at 10:15 AM EDT by a video enabled telemedicine application and verified that I am speaking with the correct person using two identifiers.  Location: Patient:Home Provider: Office Participants: patient, Husband and provider  I discussed the limitations of evaluation and management by telemedicine and the availability of in person appointments. I also discussed with the patient that there may be a patient responsible charge related to this service. The patient expressed understanding and agreed to proceed.  CC:pt states she was in to see [facility] 5/25 an tested positive for UTI was given nitrofurantion 100mg  and Bactrim she said she was good for 5 days after and the this past Wednesday shes got frequency and fullness-having to go all the time//no pain or burning-[place] and was wondering if she could get a refill [facility] [place]  History of Present Illness: Urinary Frequency  This is a recurrent problem. The current episode started more than 1 month ago. The problem occurs every urination. The problem has been waxing and waning. The patient is experiencing no pain. There has been no fever. There is no history of pyelonephritis. Associated symptoms include frequency and urgency. Pertinent negatives include no chills, discharge, flank pain, hematuria, hesitancy, nausea, possible pregnancy, sweats or vomiting. She has tried antibiotics and increased fluids for the symptoms. The treatment provided mild relief. There is no history of catheterization, kidney stones, recurrent UTIs, urinary stasis or a urological procedure.  treated with bactrim 06/2019 x10days, then with macrobid x5days in 08/2019 Abnormal urinalysis in 08/2019, but negative urine culture. Has chronic intermittent constipation.   Observations/Objective: Physical Exam Abdominal:     Tenderness: There is no abdominal tenderness.  Neurological:      Mental Status: She is alert and oriented to person, place, and time.    Assessment and Plan: Mistie was seen today for urinary tract infection.  Diagnoses and all orders for this visit:  Urinary frequency -     Ambulatory referral to Urology -     Urinalysis w microscopic + reflex cultur; Future -     nitrofurantoin, macrocrystal-monohydrate, (MACROBID) 100 MG capsule; Take 1 capsule (100 mg total) by mouth 2 (two) times daily for 3 days. -     phenazopyridine (PYRIDIUM) 100 MG tablet; Take 1 tablet (100 mg total) by mouth 3 (three) times daily as needed for pain (with food).   Follow Up Instructions: Maintain adequate oral hydration Go to lab for urine collection You will be contacted to schedule appt with urology   I discussed the assessment and treatment plan with the patient. The patient was provided an opportunity to ask questions and all were answered. The patient agreed with the plan and demonstrated an understanding of the instructions.   The patient was advised to call back or seek an in-person evaluation if the symptoms worsen or if the condition fails to improve as anticipated.   Wilfred Lacy, NP

## 2019-09-12 NOTE — Patient Instructions (Signed)
Maintain adequate oral hydration. Go to lab for urine collection. You will be contacted to schedule appt with urology

## 2019-09-12 NOTE — Telephone Encounter (Signed)
Last urine culture was negative. Needs to be seen.

## 2019-09-12 NOTE — Telephone Encounter (Signed)
Pt was seen in office on 08/26/19  Please advise.  Tamara Pope was seen today for urinary tract infection.   Diagnoses and all orders for this visit:   Bacterial urinary tract infection -     Urine Culture -     nitrofurantoin (macrocrystal-monohydrate) (MACROBID) capsule 100 mg   Urinary frequency -     POCT Urinalysis Dipstick -     nitrofurantoin (macrocrystal-monohydrate) (MACROBID) capsule 100 mg

## 2019-09-12 NOTE — Telephone Encounter (Signed)
LDM, informing pt to call office to schedule appointment

## 2019-09-15 ENCOUNTER — Other Ambulatory Visit: Payer: Self-pay

## 2019-09-15 ENCOUNTER — Other Ambulatory Visit: Payer: Medicare Other

## 2019-09-15 DIAGNOSIS — R350 Frequency of micturition: Secondary | ICD-10-CM

## 2019-09-16 ENCOUNTER — Encounter: Payer: Self-pay | Admitting: Nurse Practitioner

## 2019-09-16 LAB — NO CULTURE INDICATED

## 2019-09-16 LAB — URINALYSIS W MICROSCOPIC + REFLEX CULTURE
Bacteria, UA: NONE SEEN /HPF
Bilirubin Urine: NEGATIVE
Glucose, UA: NEGATIVE
Hgb urine dipstick: NEGATIVE
Hyaline Cast: NONE SEEN /LPF
Ketones, ur: NEGATIVE
Leukocyte Esterase: NEGATIVE
Nitrites, Initial: NEGATIVE
Protein, ur: NEGATIVE
RBC / HPF: NONE SEEN /HPF (ref 0–2)
Specific Gravity, Urine: 1.011 (ref 1.001–1.03)
Squamous Epithelial / HPF: NONE SEEN /HPF (ref <=5)
WBC, UA: NONE SEEN /HPF (ref 0–5)
pH: 6.5 (ref 5.0–8.0)

## 2019-09-25 DIAGNOSIS — N3000 Acute cystitis without hematuria: Secondary | ICD-10-CM | POA: Diagnosis not present

## 2019-09-25 DIAGNOSIS — R102 Pelvic and perineal pain: Secondary | ICD-10-CM | POA: Diagnosis not present

## 2019-09-25 DIAGNOSIS — R8279 Other abnormal findings on microbiological examination of urine: Secondary | ICD-10-CM | POA: Diagnosis not present

## 2019-11-04 DIAGNOSIS — R102 Pelvic and perineal pain: Secondary | ICD-10-CM | POA: Diagnosis not present

## 2019-11-04 DIAGNOSIS — N3000 Acute cystitis without hematuria: Secondary | ICD-10-CM | POA: Diagnosis not present

## 2019-11-05 ENCOUNTER — Other Ambulatory Visit: Payer: Self-pay

## 2019-11-06 ENCOUNTER — Ambulatory Visit (INDEPENDENT_AMBULATORY_CARE_PROVIDER_SITE_OTHER): Payer: Medicare Other | Admitting: Family Medicine

## 2019-11-06 ENCOUNTER — Encounter: Payer: Self-pay | Admitting: Family Medicine

## 2019-11-06 VITALS — BP 126/80 | HR 84 | Temp 97.8°F | Wt 141.8 lb

## 2019-11-06 DIAGNOSIS — Z780 Asymptomatic menopausal state: Secondary | ICD-10-CM | POA: Diagnosis not present

## 2019-11-06 DIAGNOSIS — F411 Generalized anxiety disorder: Secondary | ICD-10-CM

## 2019-11-06 DIAGNOSIS — J383 Other diseases of vocal cords: Secondary | ICD-10-CM | POA: Diagnosis not present

## 2019-11-06 NOTE — Patient Instructions (Signed)
Schedule annual exam for 02/19/20 or after

## 2019-11-06 NOTE — Progress Notes (Signed)
Tamara Pope is a 74 y.o. female  Chief Complaint  Patient presents with  . Other    Bladder issues she would like to discuss    HPI: Tamara Pope is a 74 y.o. female seen today for [facility] appt, previous PCP Dr. Deborra Medina.  Pt was seen by [facility] Dr. Arnette Schaumann. She was diagnosed with estrogen deficiency and started on premarin as well as cranberry supplement.   Pt is on clonazepam 0,5mg  for spastic dysphonia/laryngeal spasm, originally Rx'd by physician [facility].  She is also on venlafaxine XR 75mg  daily for anxiety.   GYN - Dr. Matthew Saras  No refills today.  Past Medical History:  Diagnosis Date  . Allergy   . Arthritis   . Dysphonia, spasmodic   . GERD (gastroesophageal reflux disease)   . History of DVT (deep vein thrombosis)    JAN 2002-- S/P BREAST IMPLANTS  RIGHT UPPER ARM (AXILLARY/ SUBCLAVIN VEIN)  . PMB (postmenopausal bleeding)     Past Surgical History:  Procedure Laterality Date  . APPENDECTOMY  AS  CHILD  . AUGMENTATION MAMMAPLASTY Bilateral    2016  . BILATERAL BREAST LIFT AND IMPLANT REDUCTION  SEPT 2014  . BREAST ENHANCEMENT SURGERY Bilateral NOV 2001  . CATARACT EXTRACTION W/ INTRAOCULAR LENS  IMPLANT, BILATERAL    . DILATATION & CURRETTAGE/HYSTEROSCOPY WITH RESECTOCOPE N/A 04/18/2013   Procedure: DILATATION & CURETTAGE/HYSTEROSCOPY ;  Surgeon: [name], MD;  Location: [facility];  Service: Gynecology;  Laterality: N/A;  . KNEE ARTHROSCOPY W/ MENISCECTOMY Right 2011  . ROTATOR CUFF REPAIR Right 2005  . TONSILLECTOMY  AS CHILD  . TOTAL KNEE ARTHROPLASTY Right 05/03/2015   Procedure: RIGHT TOTAL KNEE ARTHROPLASTY;  Surgeon: Vickey Huger, MD;  Location: [place];  Service: Orthopedics;  Laterality: Right;    Social History   Socioeconomic History  . Marital status: Married    Spouse name: Not on file  . Number of children: Not on file  . Years of education: Not on file  . Highest education level: Not on file    Occupational History    Employer: [employer].  Tobacco Use  . Smoking status: Never Smoker  . Smokeless tobacco: Never Used  Vaping Use  . Vaping Use: Never used  Substance and Sexual Activity  . Alcohol use: Yes    Comment: maybe 2 glasses a week  . Drug use: No  . Sexual activity: Yes    Partners: Male  Other Topics Concern  . Not on file  Social History Narrative   Married   Orig from [place], [place]   Has been Paediatric nurse   At least 1 daughter   Second home [employer] - enjoys yoga   Social Determinants of Health   Financial Resource Strain:   . Difficulty of Paying Living Expenses:   Food Insecurity:   . Worried About Charity fundraiser in the Last Year:   . Arboriculturist in the Last Year:   Transportation Needs:   . Film/video editor (Medical):   Marland Kitchen Lack of Transportation (Non-Medical):   Physical Activity:   . Days of Exercise per Week:   . Minutes of Exercise per Session:   Stress:   . Feeling of Stress :   Social Connections:   . Frequency of Communication with Friends and Family:   . Frequency of Social Gatherings with Friends and Family:   . Attends Religious Services:   .  Active Member of Clubs or Organizations:   . Attends Archivist Meetings:   Marland Kitchen Marital Status:   Intimate Partner Violence:   . Fear of Current or Ex-Partner:   . Emotionally Abused:   Marland Kitchen Physically Abused:   . Sexually Abused:     Family History  Problem Relation Age of Onset  . Arthritis Mother   . Lung cancer Father   . Diabetes Brother        age 92  . Birth defects Brother   . Arthritis Maternal Grandmother   . Heart disease Maternal Grandmother   . Heart attack Paternal Grandmother   . Colon cancer Neg Hx   . Stomach cancer Neg Hx      Immunization History  Administered Date(s) Administered  . Influenza, High Dose Seasonal PF 01/14/2015, 01/03/2018, 01/07/2019  . PFIZER SARS-COV-2 Vaccination 05/05/2019,  05/27/2019  . Pneumococcal Conjugate-13 10/11/2017  . Pneumococcal Polysaccharide-23 02/25/2019  . Tdap 11/03/2009    Outpatient Encounter Medications as of 11/06/2019  Medication Sig  . Biotin 5000 MCG TABS Take 1 tablet by mouth daily.  . Calcium Carb-Cholecalciferol (CALTRATE 600+D3) 600-800 MG-UNIT TABS Take 1 tablet by mouth 2 (two) times daily.  . cetirizine (ZYRTEC) 10 MG tablet Take 10 mg by mouth daily.  . Cholecalciferol (VITAMIN D3) 2000 units TABS Take 1 tablet by mouth daily.  . clonazePAM (KLONOPIN) 0.5 MG tablet   . estradiol (ESTRACE) 0.1 MG/GM vaginal cream Place vaginally.  . fluticasone (FLONASE) 50 MCG/ACT nasal spray Place 1 spray into both nostrils daily as needed for allergies or rhinitis.  . Probiotic Product (ALIGN) 4 MG CAPS Take 1 capsule by mouth daily.  Marland Kitchen venlafaxine XR (EFFEXOR-XR) 75 MG 24 hr capsule Take 75 mg by mouth daily.  Marland Kitchen augmented betamethasone dipropionate (DIPROLENE-AF) 0.05 % cream APPLY A THIN LAYER AA 2-3 TIMES DAILY (Patient not taking: Reported on 11/06/2019)  . phenazopyridine (PYRIDIUM) 100 MG tablet Take 1 tablet (100 mg total) by mouth 3 (three) times daily as needed for pain (with food). (Patient not taking: Reported on 11/06/2019)   No facility-administered encounter medications on file as of 11/06/2019.     ROS: Pertinent positives and negatives noted in HPI. Remainder of ROS non-contributory    No Known Allergies  BP 126/80 (BP Location: Left Arm, Patient Position: Sitting, Cuff Size: Normal)   Pulse 84   Temp 97.8 F (36.6 C) (Temporal)   Wt 141 lb 12.8 oz (64.3 kg)   SpO2 98%   BMI 24.34 kg/m   Physical Exam Constitutional:      General: She is not in acute distress.    Appearance: Normal appearance. She is not ill-appearing.  Pulmonary:     Effort: No respiratory distress.  Neurological:     Mental Status: She is alert and oriented to person, place, and time.  Psychiatric:        Mood and Affect: Mood normal.         Behavior: Behavior normal.      A/P:  1. Postmenopausal estrogen deficiency - recent established with urology Dr. Claudia Desanctis and started on premarin cream and cranberry capsule supplement   2. Spastic dysphonia - follows with provider at [facility] - on klonopin 0.5mg  x years with good efficacy   3. GAD (generalized anxiety disorder) - well-controlled on effexor XR 75mg  daily  Due in 02/2020 for annual exam, labs   This visit occurred during the SARS-CoV-2 public health emergency.  Safety protocols were in place, including  screening questions prior to the visit, additional usage of staff PPE, and extensive cleaning of exam room while observing appropriate contact time as indicated for disinfecting solutions.

## 2019-12-24 DIAGNOSIS — N952 Postmenopausal atrophic vaginitis: Secondary | ICD-10-CM | POA: Diagnosis not present

## 2019-12-24 DIAGNOSIS — Z6824 Body mass index (BMI) 24.0-24.9, adult: Secondary | ICD-10-CM | POA: Diagnosis not present

## 2019-12-24 DIAGNOSIS — Z01419 Encounter for gynecological examination (general) (routine) without abnormal findings: Secondary | ICD-10-CM | POA: Diagnosis not present

## 2019-12-24 DIAGNOSIS — R350 Frequency of micturition: Secondary | ICD-10-CM | POA: Diagnosis not present

## 2019-12-24 DIAGNOSIS — R309 Painful micturition, unspecified: Secondary | ICD-10-CM | POA: Diagnosis not present

## 2020-01-05 ENCOUNTER — Other Ambulatory Visit: Payer: Self-pay

## 2020-01-06 ENCOUNTER — Ambulatory Visit: Payer: Medicare Other | Admitting: Nurse Practitioner

## 2020-01-13 ENCOUNTER — Ambulatory Visit: Payer: Medicare Other | Admitting: Nurse Practitioner

## 2020-01-18 IMAGING — MG DIGITAL DIAGNOSTIC UNILATERAL RIGHT MAMMOGRAM WITH IMPLANTS, CAD
4 series · 4 of 12 positions shown · non-contrast
Comparison: Previous exam(s).

CLINICAL DATA: 71-year-old female with palpable lump in the
UPPER-OUTER RIGHT breast discovered on self-examination.

EXAM:
2D DIGITAL DIAGNOSTIC RIGHT MAMMOGRAM WITH CAD AND ADJUNCT TOMO

[R MLO synth-2D (1 of 2)]
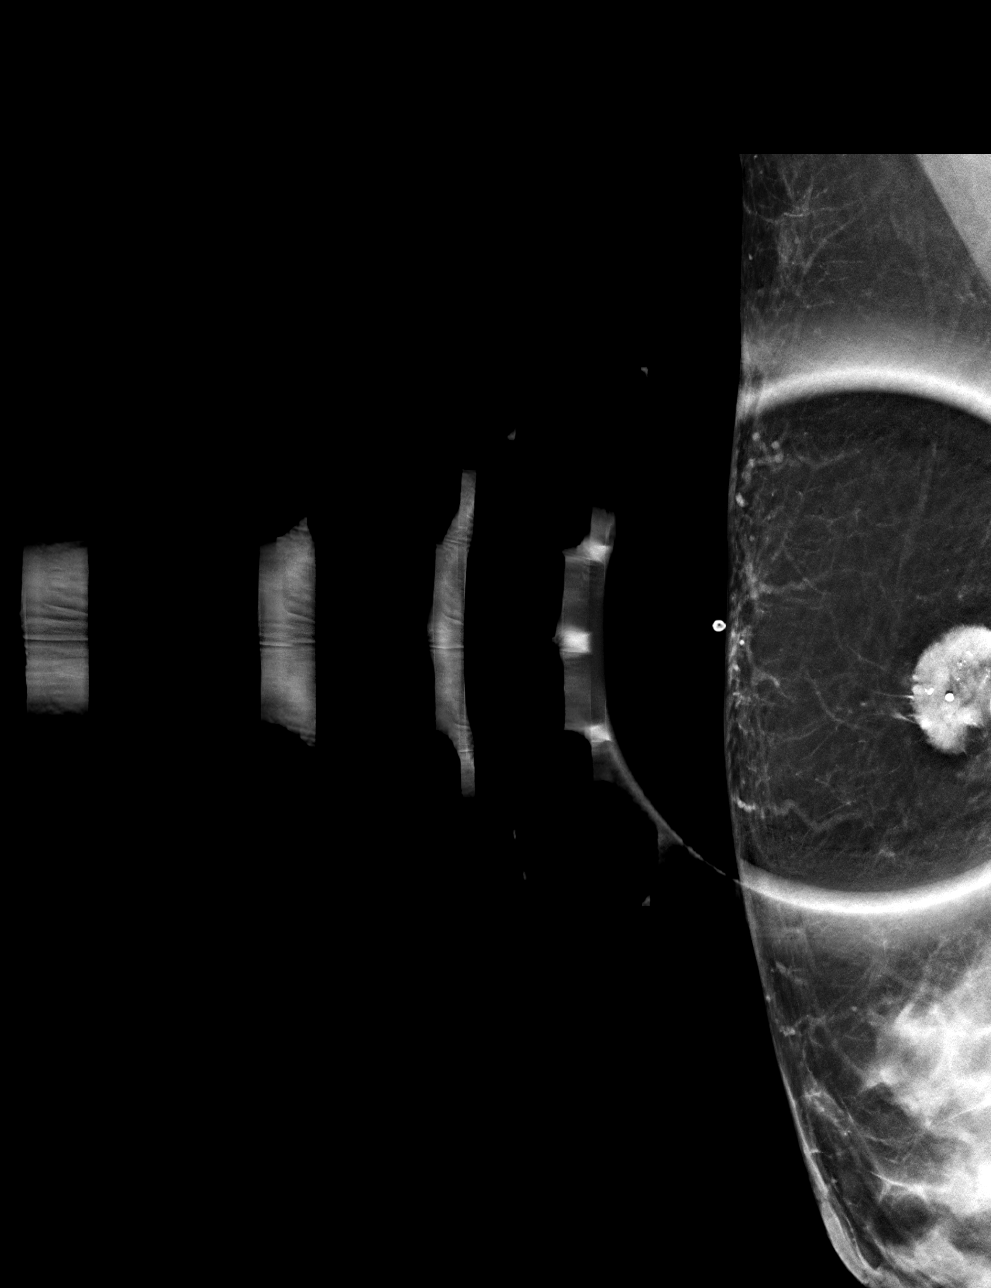

[R MLO synth-2D (2 of 2)]
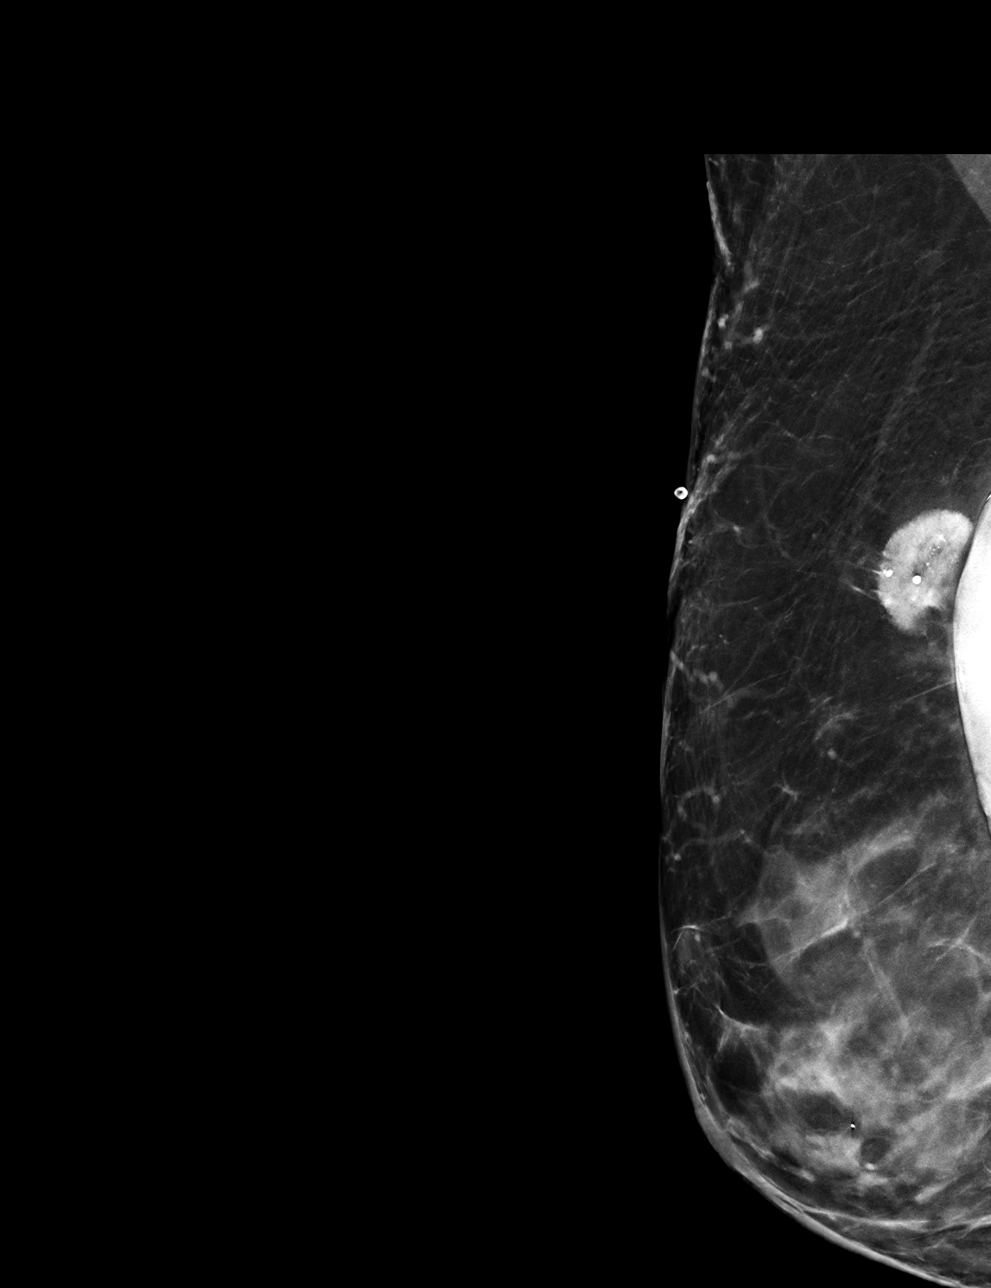

[R MLOID BREAST TOMOSYNTHESIS IMAGE tomo (1 of 2) · tomo slice 33/64.0]
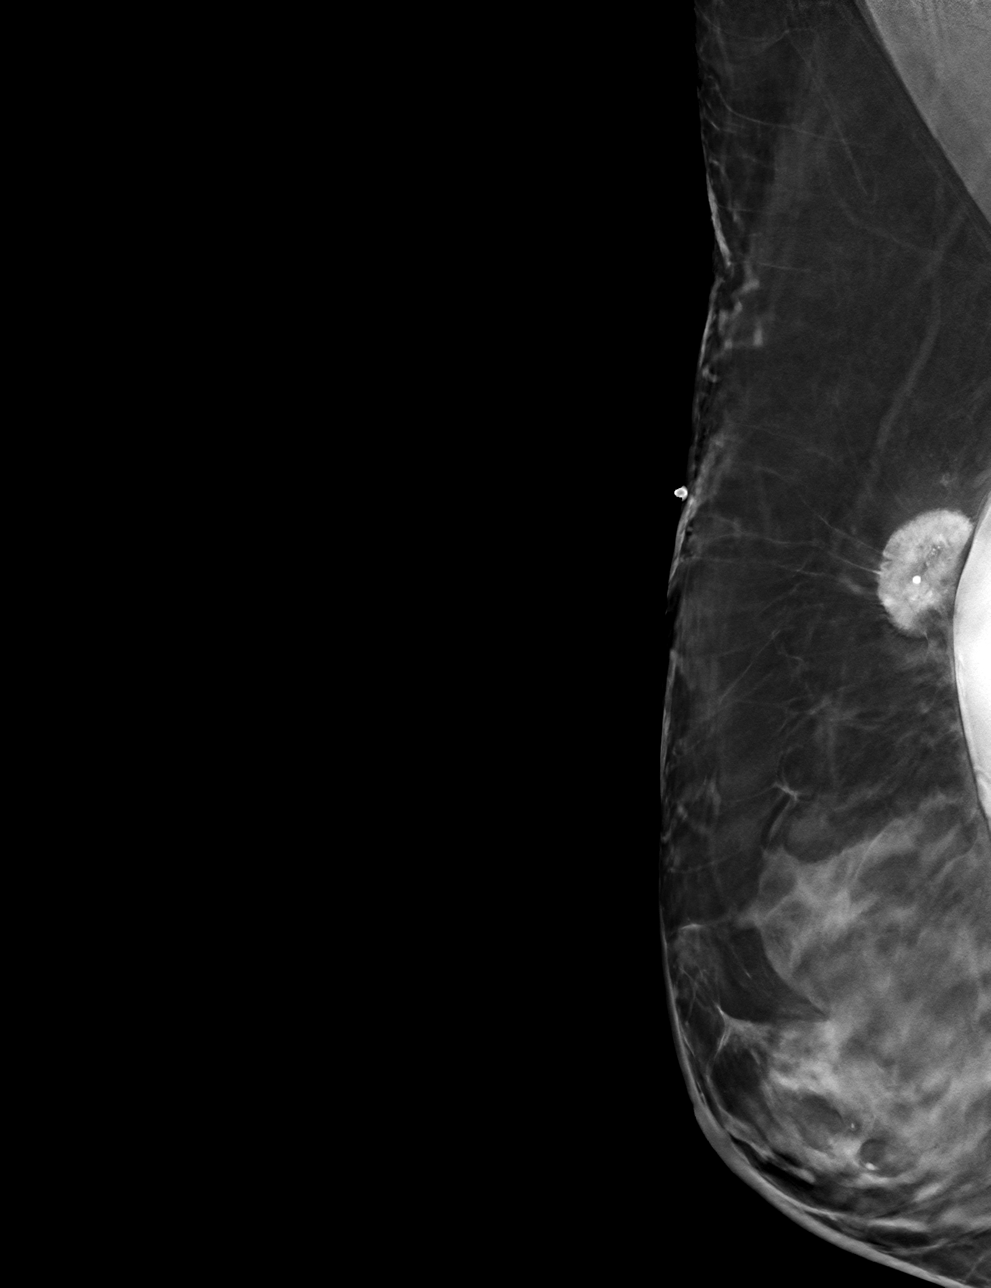

[R MLOID BREAST TOMOSYNTHESIS IMAGE tomo (2 of 2) · tomo slice 27/54.0]
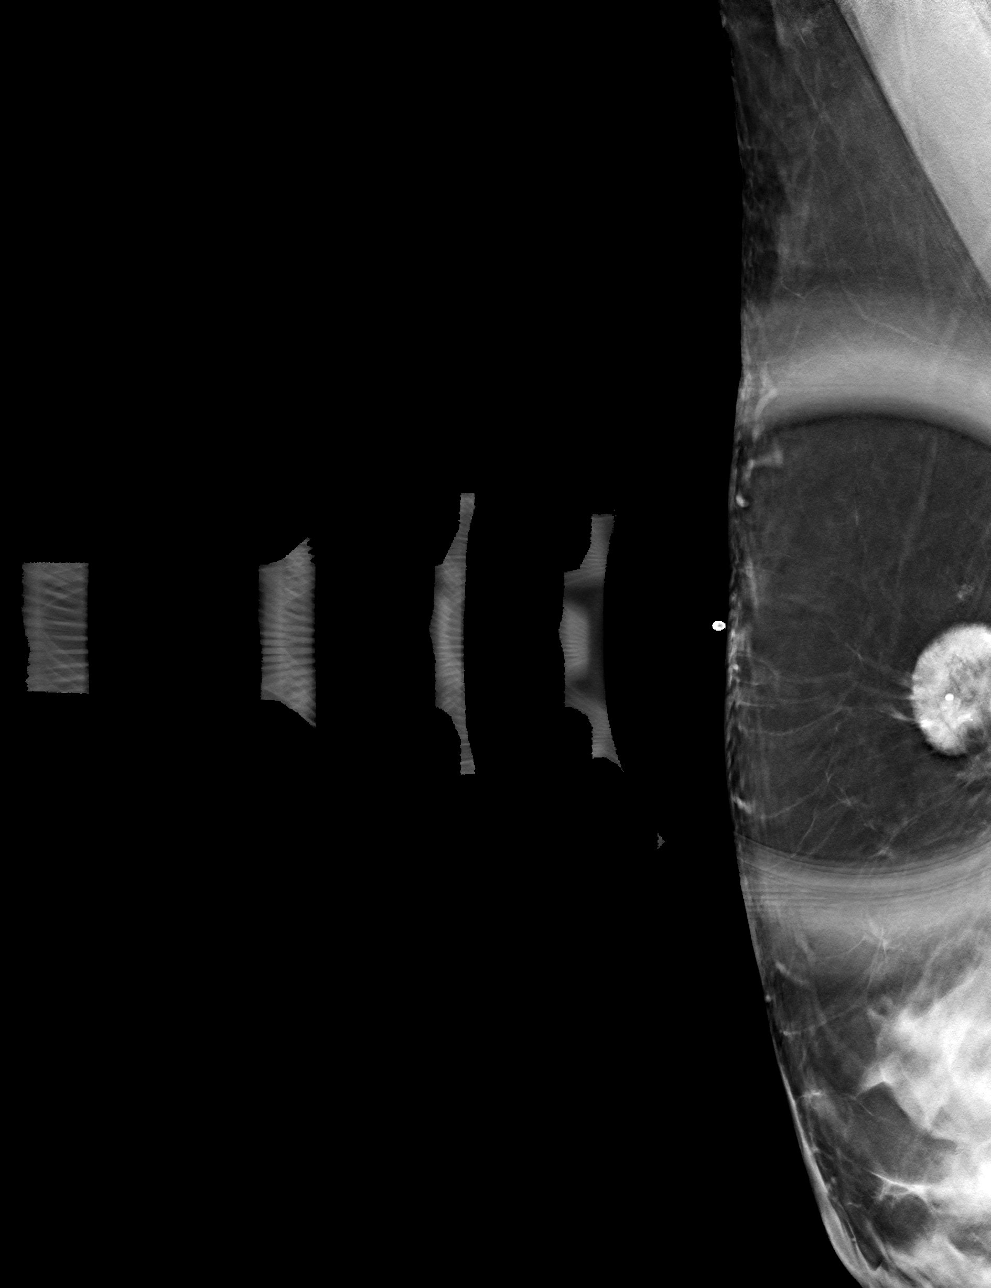

[4 of 12 positions shown; findings below may reference images not displayed]

ACR Breast Density Category c: The breast tissue is heterogeneously
dense, which may obscure small masses.
FINDINGS: 2D and 3D full field and spot compression implant displaced views of
the RIGHT breast demonstrate a stable 2 cm area of extra capsular
silicone within the UPPER-OUTER RIGHT breast, unchanged from 8177.

No suspicious mass, suspicious distortion or worrisome
calcifications are noted.

Mammographic images were processed with CAD.

On physical exam, mild thickening in the UPPER-OUTER RIGHT breast
identified.

Targeted ultrasound is performed, showing an area of
intraparenchymal silicone measuring up to 2.2 cm at the 11 o'clock
position of the RIGHT breast 7 cm from the nipple, corresponding to
the patient's palpable abnormality.
IMPRESSION: Extracapsular intraparenchymal silicone within the UPPER-OUTER RIGHT
breast corresponding to the patient's palpable abnormality. This has
been unchanged mammographically since 8177.

RECOMMENDATION:
Bilateral screening mammograms in April 2018.

I have discussed the findings and recommendations with the patient.
Results were also provided in writing at the conclusion of the
visit. If applicable, a reminder letter will be sent to the patient
regarding the next appointment.

BI-RADS CATEGORY  2: Benign.

## 2020-01-18 IMAGING — US ULTRASOUND RIGHT BREAST LIMITED
1 series · 7 of 7 positions shown · non-contrast
Comparison: Previous exam(s).

CLINICAL DATA: 71-year-old female with palpable lump in the
UPPER-OUTER RIGHT breast discovered on self-examination.

EXAM:
2D DIGITAL DIAGNOSTIC RIGHT MAMMOGRAM WITH CAD AND ADJUNCT TOMO

[Series 1: ultrasound right breast limited · 0.07mm/px · 7 of 7 slices shown]
[im 1/7]
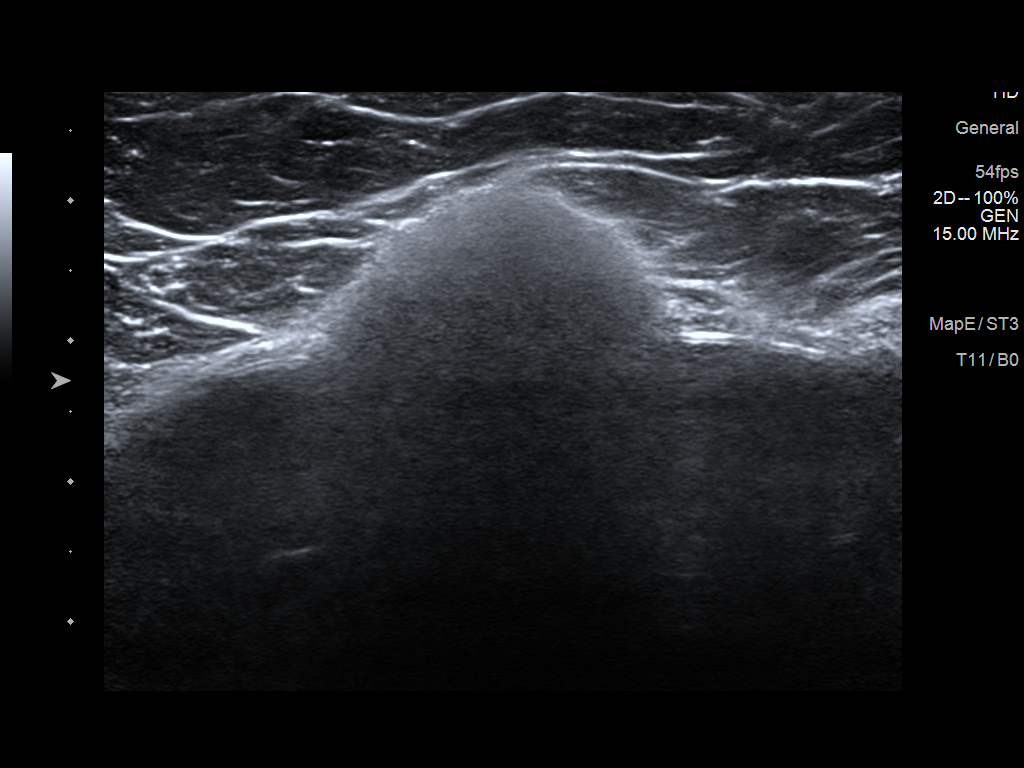
[im 2/7]
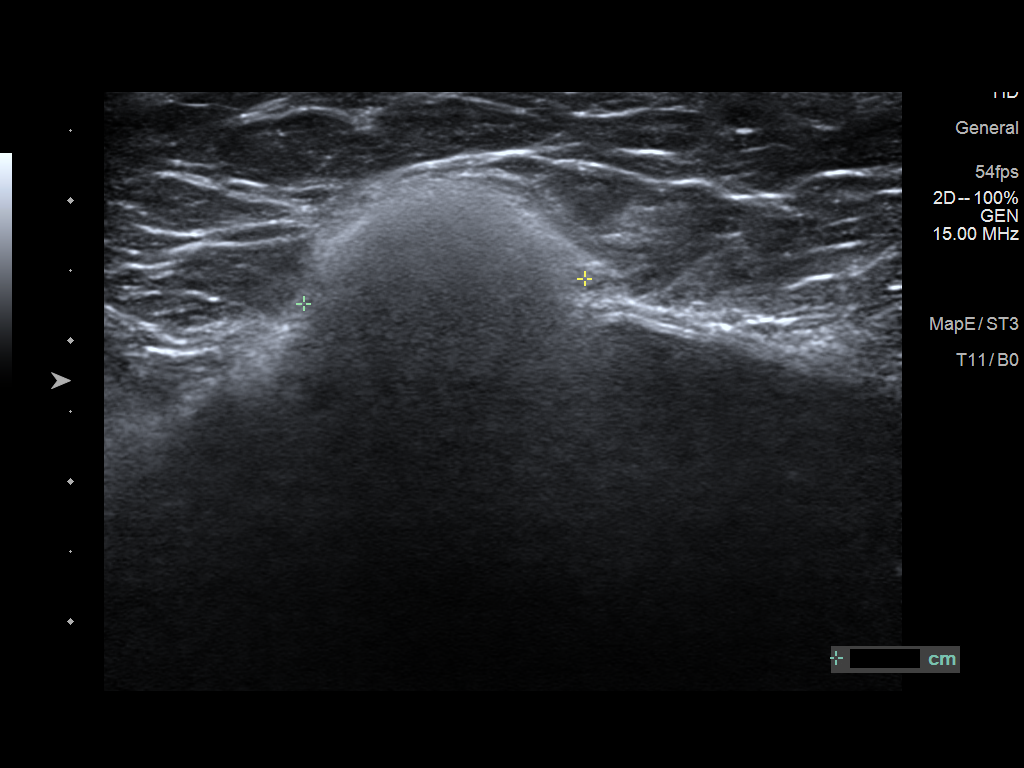
[im 3/7]
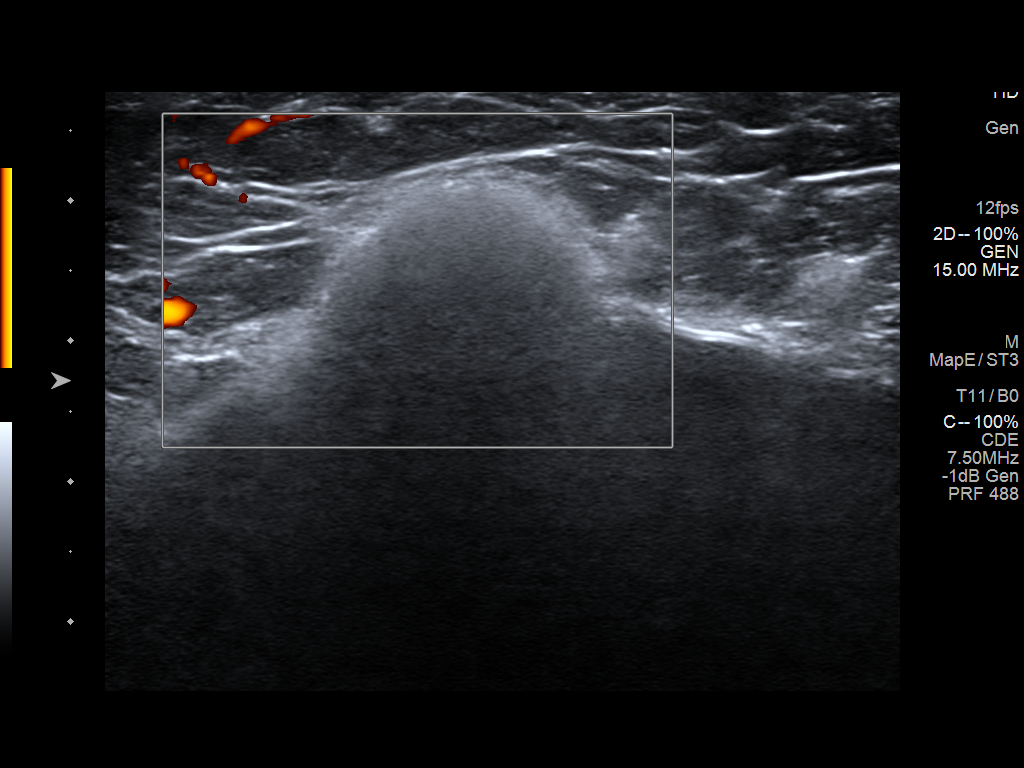
[im 4/7]
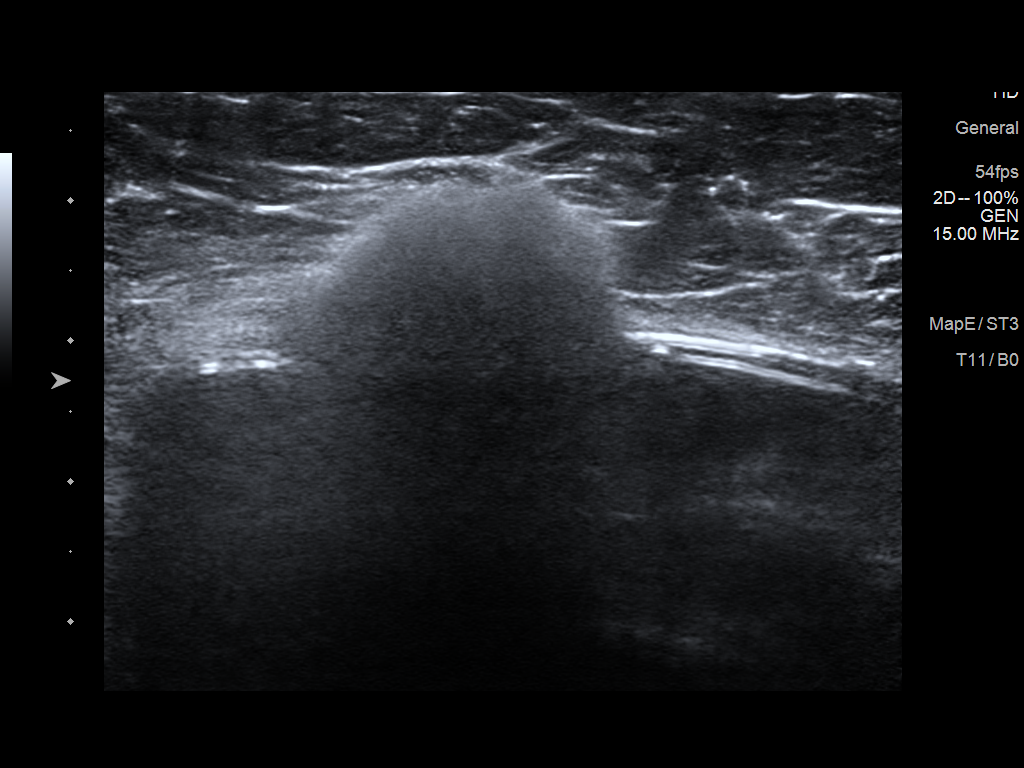
[im 5/7]
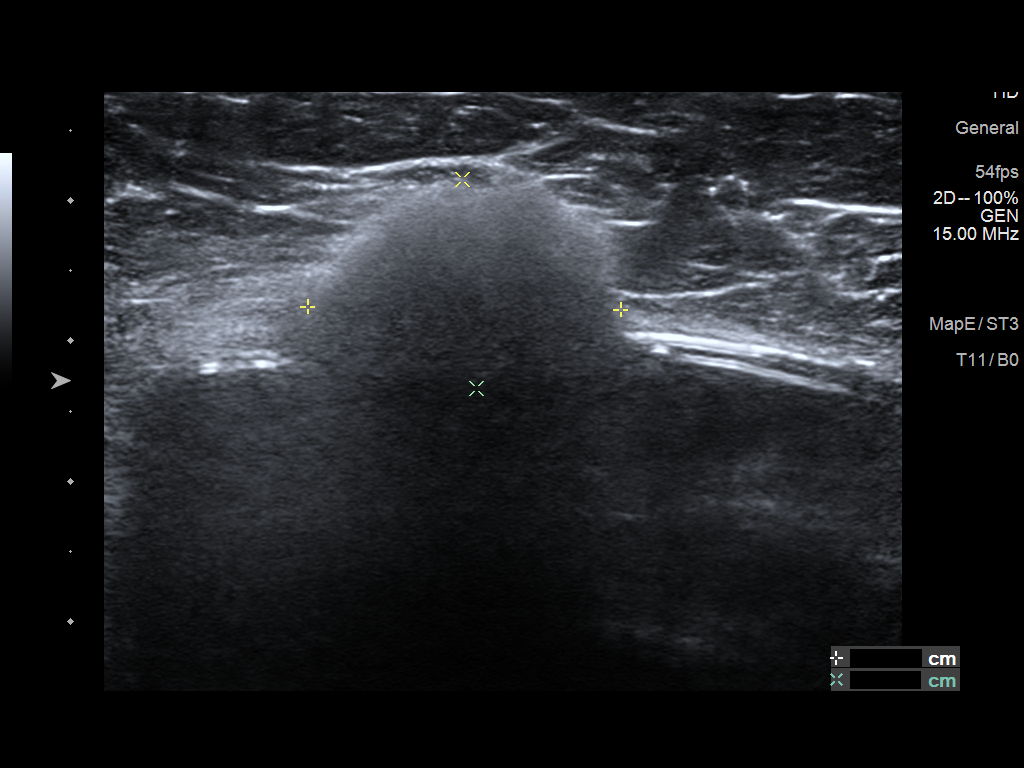
[im 6/7]
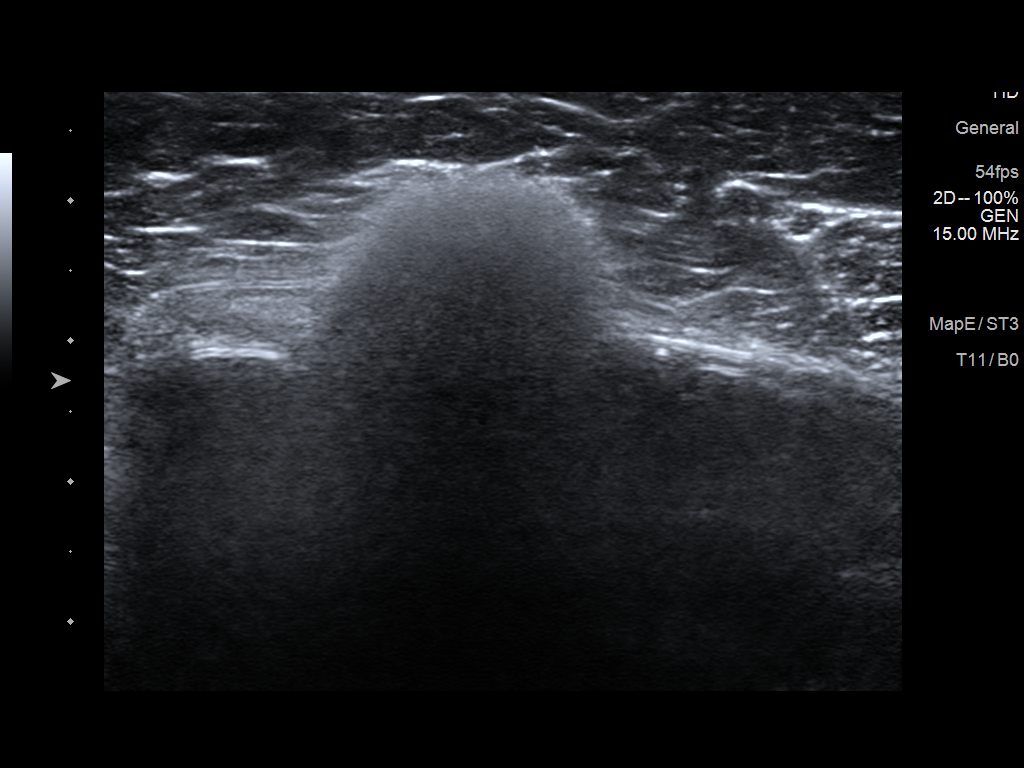
[im 7/7]
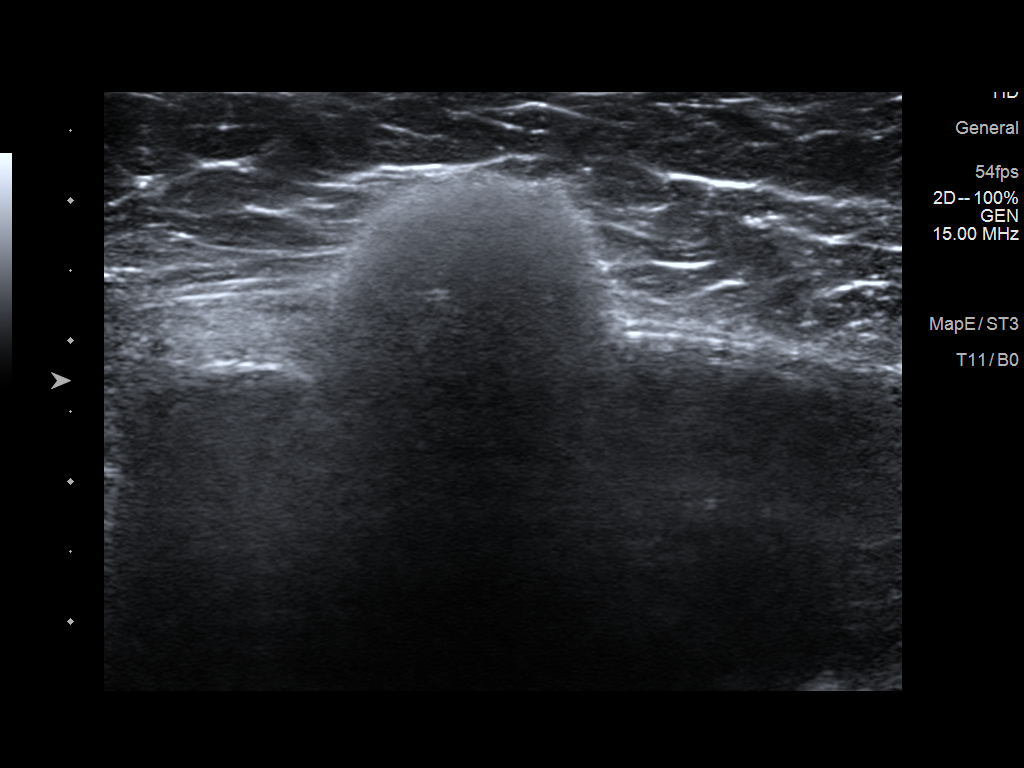

[7 of 7 positions shown; findings below may reference images not displayed]

ACR Breast Density Category c: The breast tissue is heterogeneously
dense, which may obscure small masses.
FINDINGS: 2D and 3D full field and spot compression implant displaced views of
the RIGHT breast demonstrate a stable 2 cm area of extra capsular
silicone within the UPPER-OUTER RIGHT breast, unchanged from 8177.

No suspicious mass, suspicious distortion or worrisome
calcifications are noted.

Mammographic images were processed with CAD.

On physical exam, mild thickening in the UPPER-OUTER RIGHT breast
identified.

Targeted ultrasound is performed, showing an area of
intraparenchymal silicone measuring up to 2.2 cm at the 11 o'clock
position of the RIGHT breast 7 cm from the nipple, corresponding to
the patient's palpable abnormality.
IMPRESSION: Extracapsular intraparenchymal silicone within the UPPER-OUTER RIGHT
breast corresponding to the patient's palpable abnormality. This has
been unchanged mammographically since 8177.

RECOMMENDATION:
Bilateral screening mammograms in April 2018.

I have discussed the findings and recommendations with the patient.
Results were also provided in writing at the conclusion of the
visit. If applicable, a reminder letter will be sent to the patient
regarding the next appointment.

BI-RADS CATEGORY  2: Benign.

## 2020-01-27 ENCOUNTER — Telehealth: Payer: Self-pay | Admitting: Family Medicine

## 2020-01-27 NOTE — Telephone Encounter (Signed)
Spoke with patient she was in [place] and requested a call back in 2 weeks.

## 2020-02-01 DIAGNOSIS — Z23 Encounter for immunization: Secondary | ICD-10-CM | POA: Diagnosis not present

## 2020-02-04 ENCOUNTER — Telehealth: Payer: Self-pay | Admitting: Family Medicine

## 2020-02-04 NOTE — Telephone Encounter (Signed)
Left message for patient to schedule Annual Wellness Visit.  Please schedule with Nurse Health Advisor Martha Stanley, RN at [facility]  °

## 2020-02-06 DIAGNOSIS — D225 Melanocytic nevi of trunk: Secondary | ICD-10-CM | POA: Diagnosis not present

## 2020-02-06 DIAGNOSIS — L814 Other melanin hyperpigmentation: Secondary | ICD-10-CM | POA: Diagnosis not present

## 2020-02-06 DIAGNOSIS — C4441 Basal cell carcinoma of skin of scalp and neck: Secondary | ICD-10-CM | POA: Diagnosis not present

## 2020-02-06 DIAGNOSIS — L821 Other seborrheic keratosis: Secondary | ICD-10-CM | POA: Diagnosis not present

## 2020-02-06 DIAGNOSIS — L7211 Pilar cyst: Secondary | ICD-10-CM | POA: Diagnosis not present

## 2020-02-06 DIAGNOSIS — D1801 Hemangioma of skin and subcutaneous tissue: Secondary | ICD-10-CM | POA: Diagnosis not present

## 2020-02-06 DIAGNOSIS — D2262 Melanocytic nevi of left upper limb, including shoulder: Secondary | ICD-10-CM | POA: Diagnosis not present

## 2020-02-10 ENCOUNTER — Telehealth: Payer: Self-pay

## 2020-02-10 ENCOUNTER — Ambulatory Visit (INDEPENDENT_AMBULATORY_CARE_PROVIDER_SITE_OTHER): Payer: Medicare Other

## 2020-02-10 VITALS — Ht 64.0 in | Wt 141.0 lb

## 2020-02-10 DIAGNOSIS — Z0000 Encounter for general adult medical examination without abnormal findings: Secondary | ICD-10-CM

## 2020-02-10 NOTE — Progress Notes (Signed)
Subjective:   Tamara Pope is a 74 y.o. female who presents for Medicare Annual (Subsequent) preventive examination.  I connected with [place] today by telephone and verified that I am speaking with the correct person using two identifiers. Location patient: home Location provider: work Persons participating in the virtual visit: patient, Marine scientist.    I discussed the limitations, risks, security and privacy concerns of performing an evaluation and management service by telephone and the availability of in person appointments. I also discussed with the patient that there may be a patient responsible charge related to this service. The patient expressed understanding and verbally consented to this telephonic visit.    Interactive audio and video telecommunications were attempted between this provider and patient, however failed, due to patient having technical difficulties OR patient did not have access to video capability.  We continued and completed visit with audio only.  Some vital signs may be absent or patient reported.   Time Spent with patient on telephone encounter: 25 minutes  Review of Systems     Cardiac Risk Factors include: advanced age (>39men, >76 women);dyslipidemia     Objective:    Today's Vitals   02/10/20 0816  Weight: 141 lb (64 kg)  Height: 5\' 4"  (1.626 m)   Body mass index is 24.2 kg/m.  Advanced Directives 02/10/2020 01/29/2019 12/19/2017 04/19/2015 04/18/2013  Does Patient Have a Medical Advance Directive? Yes Yes Yes Yes Patient has advance directive, copy not in chart  Type of Advance Directive [place];Living will [place];Living will [place];Living will [place];Living will [place];Living will  Does patient want to make changes to medical advance directive? - No - Patient declined - No - Patient declined No  Copy of Healthcare Power of Attorney in  Chart? Yes - validated most recent copy scanned in chart (See row information) Yes - validated most recent copy scanned in chart (See row information) No - copy requested No - copy requested Copy requested from family    Current Medications (verified) Outpatient Encounter Medications as of 02/10/2020  Medication Sig  . Biotin 5000 MCG TABS Take 1 tablet by mouth daily.  . Calcium Carb-Cholecalciferol (CALTRATE 600+D3) 600-800 MG-UNIT TABS Take 1 tablet by mouth 2 (two) times daily.  . cetirizine (ZYRTEC) 10 MG tablet Take 10 mg by mouth daily.  . Cholecalciferol (VITAMIN D3) 2000 units TABS Take 1 tablet by mouth daily.  . clonazePAM (KLONOPIN) 0.5 MG tablet   . estradiol (ESTRACE) 0.1 MG/GM vaginal cream Place vaginally.  . fluticasone (FLONASE) 50 MCG/ACT nasal spray Place 1 spray into both nostrils daily as needed for allergies or rhinitis.  . Probiotic Product (ALIGN) 4 MG CAPS Take 1 capsule by mouth daily.  Marland Kitchen augmented betamethasone dipropionate (DIPROLENE-AF) 0.05 % cream APPLY A THIN LAYER AA 2-3 TIMES DAILY (Patient not taking: Reported on 11/06/2019)  . venlafaxine XR (EFFEXOR-XR) 75 MG 24 hr capsule Take 75 mg by mouth daily.  . [DISCONTINUED] phenazopyridine (PYRIDIUM) 100 MG tablet Take 1 tablet (100 mg total) by mouth 3 (three) times daily as needed for pain (with food). (Patient not taking: Reported on 11/06/2019)   No facility-administered encounter medications on file as of 02/10/2020.    Allergies (verified) Patient has no known allergies.   History: Past Medical History:  Diagnosis Date  . Allergy   . Arthritis   . Dysphonia, spasmodic   . GERD (gastroesophageal reflux disease)   . History  of DVT (deep vein thrombosis)    JAN 2002-- S/P BREAST IMPLANTS  RIGHT UPPER ARM (AXILLARY/ SUBCLAVIN VEIN)  . PMB (postmenopausal bleeding)    Past Surgical History:  Procedure Laterality Date  . APPENDECTOMY  AS  CHILD  . AUGMENTATION MAMMAPLASTY Bilateral    2016  . BILATERAL  BREAST LIFT AND IMPLANT REDUCTION  SEPT 2014  . BREAST ENHANCEMENT SURGERY Bilateral NOV 2001  . CATARACT EXTRACTION W/ INTRAOCULAR LENS  IMPLANT, BILATERAL    . DILATATION & CURRETTAGE/HYSTEROSCOPY WITH RESECTOCOPE N/A 04/18/2013   Procedure: DILATATION & CURETTAGE/HYSTEROSCOPY ;  Surgeon: [name], MD;  Location: [facility];  Service: Gynecology;  Laterality: N/A;  . KNEE ARTHROSCOPY W/ MENISCECTOMY Right 2011  . ROTATOR CUFF REPAIR Right 2005  . TONSILLECTOMY  AS CHILD  . TOTAL KNEE ARTHROPLASTY Right 05/03/2015   Procedure: RIGHT TOTAL KNEE ARTHROPLASTY;  Surgeon: Vickey Huger, MD;  Location: [facility];  Service: Orthopedics;  Laterality: Right;   Family History  Problem Relation Age of Onset  . Arthritis Mother   . Lung cancer Father   . Diabetes Brother        age 22  . Birth defects Brother   . Arthritis Maternal Grandmother   . Heart disease Maternal Grandmother   . Heart attack Paternal Grandmother   . Colon cancer Neg Hx   . Stomach cancer Neg Hx    Social History   Socioeconomic History  . Marital status: Married    Spouse name: Not on file  . Number of children: Not on file  . Years of education: Not on file  . Highest education level: Not on file  Occupational History    Employer: [employer]  Tobacco Use  . Smoking status: Never Smoker  . Smokeless tobacco: Never Used  Vaping Use  . Vaping Use: Never used  Substance and Sexual Activity  . Alcohol use: Yes    Comment: maybe 2 glasses a week  . Drug use: No  . Sexual activity: Yes    Partners: Male  Other Topics Concern  . 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 Difficulty of Paying Living Expenses: Not hard at all  Food Insecurity: No Food  Insecurity  . Worried About Charity fundraiser in the Last Year: Never true  . Ran Out of Food in the Last Year: Never true  Transportation Needs: No Transportation Needs  . Lack of Transportation (Medical): No  . Lack of Transportation (Non-Medical): No  Physical Activity: Sufficiently Active  . Days of Exercise per Week: 4 days  . Minutes of Exercise per Session: 60 min  Stress: No Stress Concern Present  . Feeling of Stress : Not at all  Social Connections: Socially Integrated  . Frequency of Communication with Friends and Family: More than three times a week  . Frequency of Social Gatherings with Friends and Family: More than three times a week  . Attends Religious Services: More than 4 times per year  . Active Member of Clubs or Organizations: Yes  . Attends Archivist Meetings: More than 4 times per year  . Marital Status: Married    Tobacco Counseling Counseling given: Not Answered  Clinical Intake:  Pre-visit preparation completed: Yes  Pain : No/denies pain     Nutritional Status: BMI of 19-24  Normal Nutritional Risks: None Diabetes: No  How often do you need to have someone help you when you read instructions, pamphlets, or other written materials from your doctor or pharmacy?: 1 - Never What is the last grade level you completed in school?: 2 yrs of college  Diabetic?No  Interpreter Needed?: No  Information entered by :: Caroleen Hamman LPN   Activities of Daily Living In your present state of health, do you have any difficulty performing the following activities: 02/10/2020  Hearing? N  Vision? N  Difficulty concentrating or making decisions? N  Walking or climbing stairs? N  Dressing or bathing? N  Doing errands, shopping? N  Preparing Food and eating ? N  Using the Toilet? N  In the past six months, have you accidently leaked urine? N  Do you have problems with loss of bowel control? N  Managing your Medications? N  Managing your  Finances? N  Housekeeping or managing your Housekeeping? N  Some recent data might be hidden    Patient Care Team: Ronnald Nian, DO as PCP - General (Family Medicine) Molli Posey, MD as Consulting Physician (Obstetrics and Gynecology)  Indicate any recent Medical Services you may have received from other than [facility] providers in the past year (date may be approximate).     Assessment:   This is a routine wellness examination for [place].  Hearing/Vision screen  Hearing Screening   125Hz  250Hz  500Hz  1000Hz  2000Hz  3000Hz  4000Hz  6000Hz  8000Hz   Right ear:           Left ear:           Comments: No issues  Vision Screening Comments: Last eye exam-08/2019-Dr. Thayer Jew  Dietary issues and exercise activities discussed: Current Exercise Habits: Home exercise routine, Type of exercise: walking;strength training/weights, Time (Minutes): 60, Frequency (Times/Week): 4, Weekly Exercise (Minutes/Week): 240, Intensity: Mild, Exercise limited by: None identified  Goals    . Maintain healthy active lifestyle.    . Weight (lb) < 148 lb (67.1 kg)     Continue to exercise and eat healthy.       Depression Screen PHQ 2/9 Scores 02/10/2020 02/18/2019 01/29/2019 04/23/2018 12/19/2017 10/11/2017  PHQ - 2 Score 0 1 0 0 0 0  PHQ- 9 Score - 6 - - - -    Fall Risk Fall Risk  02/10/2020 01/29/2019 04/23/2018 12/19/2017 10/11/2017  Falls in the past year? 1 0 0 No No  Number falls in past yr: 0 0 - - -  Injury with Fall? 1 0 - - -  Risk for fall due to : History of fall(s) - - - -  Follow up Falls prevention discussed - Falls evaluation completed - -    Any stairs in or around the home? Yes  If so, are there any without handrails? No  Home free of loose throw rugs in walkways, pet beds, electrical cords, etc? Yes  Adequate lighting in your home to reduce risk of falls? Yes   ASSISTIVE DEVICES UTILIZED TO PREVENT FALLS:  Life alert? No  Use of a cane, walker or w/c? No  Grab bars in the  bathroom? Yes  Shower chair or bench in shower? No  Elevated toilet seat or a handicapped toilet? No   TIMED UP AND GO:  Was the test performed? No . Phone visit   Cognitive Function:No cognitive impairment noted  Immunizations Immunization History  Administered Date(s) Administered  . Influenza, High Dose Seasonal PF 01/14/2015, 01/03/2018, 01/07/2019  . PFIZER SARS-COV-2 Vaccination 05/05/2019, 05/27/2019, 02/08/2020  . Pneumococcal Conjugate-13 10/11/2017  . Pneumococcal Polysaccharide-23 02/25/2019  . Tdap 11/03/2009  . Zoster 12/05/2019    TDAP status: Due, Education has been provided regarding the importance of this vaccine. Advised may receive this vaccine at local pharmacy or Health Dept. Aware to provide a copy of the vaccination record if obtained from local pharmacy or Health Dept. Verbalized acceptance and understanding.   Flu vaccine status: Due- Patient states she plans to get vaccine next week.  Pneumococcal vaccine status: Up to date   Covid-19 vaccine status: Completed vaccines  Qualifies for Shingles Vaccine? No   Zostavax completed No   Shingrix Completed?: Yes  Screening Tests Health Maintenance  Topic Date Due  . INFLUENZA VACCINE  11/02/2019  . TETANUS/TDAP  11/04/2019  . COLONOSCOPY  11/16/2025  . DEXA SCAN  Completed  . COVID-19 Vaccine  Completed  . Hepatitis C Screening  Completed  . PNA vac Low Risk Adult  Completed    Health Maintenance  Health Maintenance Due  Topic Date Due  . INFLUENZA VACCINE  11/02/2019  . TETANUS/TDAP  11/04/2019    Colorectal cancer screening: No longer required.    Mammogram status: Patient states she has a mammogram scheduled for December 2021.  Bone Density status: Patient states she has a bone density  scheduled for December 2021.  Lung Cancer Screening: (Low Dose CT Chest recommended if Age 39-80 years, 30 pack-year currently smoking OR have quit w/in 15years.) does not qualify.      Additional Screening:  Hepatitis C Screening:  Completed 10/11/2017  Vision Screening: Recommended annual ophthalmology exams for early detection of glaucoma and other disorders of the eye. Is the patient up to date with their annual eye exam?  Yes  Who is the provider or what is the name of the office in which the patient attends annual eye exams? Dr. Cheron Schaumann   Dental Screening: Recommended annual dental exams for proper oral hygiene  Community Resource Referral / Chronic Care Management: CRR required this visit?  No   CCM required this visit?  No      Plan:     I have personally reviewed and noted the following in the patient's chart:   . Medical and social history . Use of alcohol, tobacco or illicit drugs  . Current medications and supplements . Functional ability and status . Nutritional status . Physical activity . Advanced directives . List of other physicians . Hospitalizations, surgeries, and ER visits in previous 12 months . Vitals . Screenings to include cognitive, depression, and falls . Referrals and appointments  In addition, I have reviewed and discussed with patient certain preventive protocols, quality metrics, and best practice recommendations. A written personalized care plan for preventive services as well as general preventive health recommendations were provided to patient.   Due to this being a telephonic visit, the after visit summary with patients personalized plan was offered to patient via mail or my-chart. Patient would like to access on my-chart.   Marta Antu, LPN   [id number]  Nurse Health Advisor  Nurse Notes: None

## 2020-02-10 NOTE — Patient Instructions (Signed)
Tamara Pope , Thank you for taking time to complete your Medicare Wellness Visit. I appreciate your ongoing commitment to your health goals. Please review the following plan we discussed and let me know if I can assist you in the future.   Screening recommendations/referrals: Colonoscopy: No longer required Mammogram: Scheduled for 03/2020 Bone Density: Schedule for 03/2020 Recommended yearly ophthalmology/optometry visit for glaucoma screening and checkup Recommended yearly dental visit for hygiene and checkup  Vaccinations: Influenza vaccine: Due Pneumococcal vaccine: Completed vaccines Tdap vaccine: Discuss with pharmacy Shingles vaccine: Completed vaccines   Covid-19:Completed vaccines  Advanced directives: Copy in chart  Conditions/risks identified: See problem list  Next appointment: Follow up in one year for your annual wellness visit    Preventive Care 76 Years and Older, Female Preventive care refers to lifestyle choices and visits with your health care provider that can promote health and wellness. What does preventive care include?  A yearly physical exam. This is also called an annual well check.  Dental exams once or twice a year.  Routine eye exams. Ask your health care provider how often you should have your eyes checked.  Personal lifestyle choices, including:  Daily care of your teeth and gums.  Regular physical activity.  Eating a healthy diet.  Avoiding tobacco and drug use.  Limiting alcohol use.  Practicing safe sex.  Taking low-dose aspirin every day.  Taking vitamin and mineral supplements as recommended by your health care provider. What happens during an annual well check? The services and screenings done by your health care provider during your annual well check will depend on your age, overall health, lifestyle risk factors, and family history of disease. Counseling  Your health care provider may ask you questions about your:  Alcohol  use.  Tobacco use.  Drug use.  Emotional well-being.  Home and relationship well-being.  Sexual activity.  Eating habits.  History of falls.  Memory and ability to understand (cognition).  Work and work Statistician.  Reproductive health. Screening  You may have the following tests or measurements:  Height, weight, and BMI.  Blood pressure.  Lipid and cholesterol levels. These may be checked every 5 years, or more frequently if you are over 45 years old.  Skin check.  Lung cancer screening. You may have this screening every year starting at age 76 if you have a 30-pack-year history of smoking and currently smoke or have quit within the past 15 years.  Fecal occult blood test (FOBT) of the stool. You may have this test every year starting at age 76.  Flexible sigmoidoscopy or colonoscopy. You may have a sigmoidoscopy every 5 years or a colonoscopy every 10 years starting at age 76.  Hepatitis C blood test.  Hepatitis B blood test.  Sexually transmitted disease (STD) testing.  Diabetes screening. This is done by checking your blood sugar (glucose) after you have not eaten for a while (fasting). You may have this done every 1-3 years.  Bone density scan. This is done to screen for osteoporosis. You may have this done starting at age 76.  Mammogram. This may be done every 1-2 years. Talk to your health care provider about how often you should have regular mammograms. Talk with your health care provider about your test results, treatment options, and if necessary, the need for more tests. Vaccines  Your health care provider may recommend certain vaccines, such as:  Influenza vaccine. This is recommended every year.  Tetanus, diphtheria, and acellular pertussis (Tdap, Td) vaccine. You  may need a Td booster every 10 years.  Zoster vaccine. You may need this after age 33.  Pneumococcal 13-valent conjugate (PCV13) vaccine. One dose is recommended after age  42.  Pneumococcal polysaccharide (PPSV23) vaccine. One dose is recommended after age 59. Talk to your health care provider about which screenings and vaccines you need and how often you need them. This information is not intended to replace advice given to you by your health care provider. Make sure you discuss any questions you have with your health care provider. Document Released: 04/16/2015 Document Revised: 12/08/2015 Document Reviewed: 01/19/2015 Elsevier Interactive Patient Education  2017 [place] Prevention in the Home Falls can cause injuries. They can happen to people of all ages. There are many things you can do to make your home safe and to help prevent falls. What can I do on the outside of my home?  Regularly fix the edges of walkways and driveways and fix any cracks.  Remove anything that might make you trip as you walk through a door, such as a raised step or threshold.  Trim any bushes or trees on the path to your home.  Use bright outdoor lighting.  Clear any walking paths of anything that might make someone trip, such as rocks or tools.  Regularly check to see if handrails are loose or broken. Make sure that both sides of any steps have handrails.  Any raised decks and porches should have guardrails on the edges.  Have any leaves, snow, or ice cleared regularly.  Use sand or salt on walking paths during winter.  Clean up any spills in your garage right away. This includes oil or grease spills. What can I do in the bathroom?  Use night lights.  Install grab bars by the toilet and in the tub and shower. Do not use towel bars as grab bars.  Use non-skid mats or decals in the tub or shower.  If you need to sit down in the shower, use a plastic, non-slip stool.  Keep the floor dry. Clean up any water that spills on the floor as soon as it happens.  Remove soap buildup in the tub or shower regularly.  Attach bath mats securely with double-sided  non-slip rug tape.  Do not have throw rugs and other things on the floor that can make you trip. What can I do in the bedroom?  Use night lights.  Make sure that you have a light by your bed that is easy to reach.  Do not use any sheets or blankets that are too big for your bed. They should not hang down onto the floor.  Have a firm chair that has side arms. You can use this for support while you get dressed.  Do not have throw rugs and other things on the floor that can make you trip. What can I do in the kitchen?  Clean up any spills right away.  Avoid walking on wet floors.  Keep items that you use a lot in easy-to-reach places.  If you need to reach something above you, use a strong step stool that has a grab bar.  Keep electrical cords out of the way.  Do not use floor polish or wax that makes floors slippery. If you must use wax, use non-skid floor wax.  Do not have throw rugs and other things on the floor that can make you trip. What can I do with my stairs?  Do not leave any items  on the stairs.  Make sure that there are handrails on both sides of the stairs and use them. Fix handrails that are broken or loose. Make sure that handrails are as long as the stairways.  Check any carpeting to make sure that it is firmly attached to the stairs. Fix any carpet that is loose or worn.  Avoid having throw rugs at the top or bottom of the stairs. If you do have throw rugs, attach them to the floor with carpet tape.  Make sure that you have a light switch at the top of the stairs and the bottom of the stairs. If you do not have them, ask someone to add them for you. What else can I do to help prevent falls?  Wear shoes that:  Do not have high heels.  Have rubber bottoms.  Are comfortable and fit you well.  Are closed at the toe. Do not wear sandals.  If you use a stepladder:  Make sure that it is fully opened. Do not climb a closed stepladder.  Make sure that both  sides of the stepladder are locked into place.  Ask someone to hold it for you, if possible.  Clearly mark and make sure that you can see:  Any grab bars or handrails.  First and last steps.  Where the edge of each step is.  Use tools that help you move around (mobility aids) if they are needed. These include:  Canes.  Walkers.  Scooters.  Crutches.  Turn on the lights when you go into a dark area. Replace any light bulbs as soon as they burn out.  Set up your furniture so you have a clear path. Avoid moving your furniture around.  If any of your floors are uneven, fix them.  If there are any pets around you, be aware of where they are.  Review your medicines with your doctor. Some medicines can make you feel dizzy. This can increase your chance of falling. Ask your doctor what other things that you can do to help prevent falls. This information is not intended to replace advice given to you by your health care provider. Make sure you discuss any questions you have with your health care provider. Document Released: 01/14/2009 Document Revised: 08/26/2015 Document Reviewed: 04/24/2014 Elsevier Interactive Patient Education  2017 [employer].

## 2020-02-10 NOTE — Telephone Encounter (Signed)
Pt calling in regards to her Rx, Effexor 75MCG.  Pt explained that the pharmacy will need a New RX sent in by Dr. Bryan Lemma.  I informed pt that Dr. Loletha Grayer will be in office tomorrow to get that sent in for her.

## 2020-02-11 MED ORDER — VENLAFAXINE HCL ER 75 MG PO CP24: 75.0000 mg | ORAL_CAPSULE | Freq: Every day | ORAL | 3 refills | Status: DC

## 2020-02-11 NOTE — Telephone Encounter (Signed)
Refill sent for 90 day supply w 3RF

## 2020-02-20 DIAGNOSIS — R490 Dysphonia: Secondary | ICD-10-CM | POA: Diagnosis not present

## 2020-02-20 DIAGNOSIS — R498 Other voice and resonance disorders: Secondary | ICD-10-CM | POA: Diagnosis not present

## 2020-02-20 DIAGNOSIS — J385 Laryngeal spasm: Secondary | ICD-10-CM | POA: Diagnosis not present

## 2020-03-04 ENCOUNTER — Encounter: Payer: Medicare Other | Admitting: Family Medicine

## 2020-03-09 DIAGNOSIS — Z85828 Personal history of other malignant neoplasm of skin: Secondary | ICD-10-CM | POA: Diagnosis not present

## 2020-03-09 DIAGNOSIS — C4441 Basal cell carcinoma of skin of scalp and neck: Secondary | ICD-10-CM | POA: Diagnosis not present

## 2020-03-23 DIAGNOSIS — Z1231 Encounter for screening mammogram for malignant neoplasm of breast: Secondary | ICD-10-CM | POA: Diagnosis not present

## 2020-03-23 DIAGNOSIS — M8588 Other specified disorders of bone density and structure, other site: Secondary | ICD-10-CM | POA: Diagnosis not present

## 2020-03-23 DIAGNOSIS — N958 Other specified menopausal and perimenopausal disorders: Secondary | ICD-10-CM | POA: Diagnosis not present

## 2020-04-07 DIAGNOSIS — J385 Laryngeal spasm: Secondary | ICD-10-CM | POA: Diagnosis not present

## 2020-04-15 DIAGNOSIS — Z85828 Personal history of other malignant neoplasm of skin: Secondary | ICD-10-CM | POA: Diagnosis not present

## 2020-04-15 DIAGNOSIS — D692 Other nonthrombocytopenic purpura: Secondary | ICD-10-CM | POA: Diagnosis not present

## 2020-04-15 DIAGNOSIS — L239 Allergic contact dermatitis, unspecified cause: Secondary | ICD-10-CM | POA: Diagnosis not present

## 2020-04-20 DIAGNOSIS — J383 Other diseases of vocal cords: Secondary | ICD-10-CM | POA: Diagnosis not present

## 2020-04-21 ENCOUNTER — Telehealth: Payer: Self-pay

## 2020-04-21 ENCOUNTER — Other Ambulatory Visit: Payer: Self-pay

## 2020-04-21 DIAGNOSIS — R399 Unspecified symptoms and signs involving the genitourinary system: Secondary | ICD-10-CM

## 2020-04-21 MED ORDER — NITROFURANTOIN MONOHYD MACRO 100 MG PO CAPS: 100.0000 mg | ORAL_CAPSULE | Freq: Two times a day (BID) | ORAL | 0 refills | Status: AC

## 2020-04-21 NOTE — Telephone Encounter (Signed)
Lab orders placed and Rx for macrobid sent to pharm so pt can start tomorrow once she gives urine sample. Increase water intake with goal of 64-80oz per day

## 2020-04-21 NOTE — Telephone Encounter (Signed)
Pt calling to see if she could schedule a OV w/Dr. C in regards to a possible UTI.  Pt c/o, feeling pressure, dark urine and frequent urine starting Monday.  I informed pt that Dr. Loletha Grayer did not have any openings this week, as the pt will be leaving to go out of town Friday.  Pt is scheduled for a lab, urine sample tomorrow morning.  Can you place order for urine sample lab?  Pt is hoping to get something sent into pharmacy for relief.  Please see message and advise.  Thank you.

## 2020-04-22 ENCOUNTER — Other Ambulatory Visit (INDEPENDENT_AMBULATORY_CARE_PROVIDER_SITE_OTHER): Payer: Medicare Other

## 2020-04-22 ENCOUNTER — Encounter: Payer: Self-pay | Admitting: Family Medicine

## 2020-04-22 DIAGNOSIS — R399 Unspecified symptoms and signs involving the genitourinary system: Secondary | ICD-10-CM | POA: Diagnosis not present

## 2020-04-22 LAB — URINALYSIS
Bilirubin Urine: NEGATIVE
Ketones, ur: NEGATIVE
Leukocytes,Ua: NEGATIVE
Nitrite: NEGATIVE
Specific Gravity, Urine: 1.01 (ref 1.000–1.030)
Total Protein, Urine: NEGATIVE
Urine Glucose: NEGATIVE
Urobilinogen, UA: 0.2 (ref 0.0–1.0)
pH: 6 (ref 5.0–8.0)

## 2020-04-22 NOTE — Telephone Encounter (Signed)
Pt informed of message below, verbalized understanding.

## 2020-04-23 LAB — URINE CULTURE
MICRO NUMBER:: 11438650
SPECIMEN QUALITY:: ADEQUATE

## 2020-05-06 ENCOUNTER — Other Ambulatory Visit: Payer: Self-pay

## 2020-05-07 ENCOUNTER — Ambulatory Visit (INDEPENDENT_AMBULATORY_CARE_PROVIDER_SITE_OTHER): Payer: Medicare Other | Admitting: Family Medicine

## 2020-05-07 ENCOUNTER — Encounter: Payer: Self-pay | Admitting: Family Medicine

## 2020-05-07 VITALS — BP 128/76 | HR 82 | Temp 97.7°F | Ht 63.75 in | Wt 140.2 lb

## 2020-05-07 DIAGNOSIS — E559 Vitamin D deficiency, unspecified: Secondary | ICD-10-CM

## 2020-05-07 DIAGNOSIS — J383 Other diseases of vocal cords: Secondary | ICD-10-CM

## 2020-05-07 DIAGNOSIS — Z0000 Encounter for general adult medical examination without abnormal findings: Secondary | ICD-10-CM

## 2020-05-07 DIAGNOSIS — E785 Hyperlipidemia, unspecified: Secondary | ICD-10-CM

## 2020-05-07 DIAGNOSIS — F411 Generalized anxiety disorder: Secondary | ICD-10-CM | POA: Diagnosis not present

## 2020-05-07 DIAGNOSIS — R739 Hyperglycemia, unspecified: Secondary | ICD-10-CM

## 2020-05-07 MED ORDER — CLONAZEPAM 0.5 MG PO TABS: 0.5000 mg | ORAL_TABLET | Freq: Every day | ORAL | 2 refills | Status: DC

## 2020-05-07 NOTE — Patient Instructions (Signed)
   [facility] [address] [place], [place], [place] [address] [phone]  [facility] [address] [place], [place], [place] [address] [phone]

## 2020-05-07 NOTE — Progress Notes (Signed)
Tamara Pope is a 75 y.o. female  Chief Complaint  Patient presents with  . Annual Exam    CPE/labs.   No refills needed and is fasting today.      HPI: [name] is a 75 y.o. female seen today for annual exam, labs.   Last mammo: 01/2020 - normal Last Dexa: 01/2020 - normal Last colonoscopy: 11/2015 - Dr. Collene Mares. Had colo then virtual colo d/t poor prep. Normal virtual colo. - unsure of f/u but thinks she will decline further screening  Dental: UTD Vision: UTD  Med refills needed today? Klonopin 0.5mg  daily for spastic dysphonia   Past Medical History:  Diagnosis Date  . Allergy   . Arthritis   . Dysphonia, spasmodic   . GERD (gastroesophageal reflux disease)   . History of DVT (deep vein thrombosis)    JAN 2002-- S/P BREAST IMPLANTS  RIGHT UPPER ARM (AXILLARY/ SUBCLAVIN VEIN)  . PMB (postmenopausal bleeding)     Past Surgical History:  Procedure Laterality Date  . APPENDECTOMY  AS  CHILD  . AUGMENTATION MAMMAPLASTY Bilateral    2016  . BILATERAL BREAST LIFT AND IMPLANT REDUCTION  SEPT 2014  . BREAST ENHANCEMENT SURGERY Bilateral NOV 2001  . CATARACT EXTRACTION W/ INTRAOCULAR LENS  IMPLANT, BILATERAL    . DILATATION & CURRETTAGE/HYSTEROSCOPY WITH RESECTOCOPE N/A 04/18/2013   Procedure: DILATATION & CURETTAGE/HYSTEROSCOPY ;  Surgeon: [name], MD;  Location: [facility];  Service: Gynecology;  Laterality: N/A;  . KNEE ARTHROSCOPY W/ MENISCECTOMY Right 2011  . ROTATOR CUFF REPAIR Right 2005  . skin surgery    . TONSILLECTOMY  AS CHILD  . TOTAL KNEE ARTHROPLASTY Right 05/03/2015   Procedure: RIGHT TOTAL KNEE ARTHROPLASTY;  Surgeon: Vickey Huger, MD;  Location: [place];  Service: Orthopedics;  Laterality: Right;    Social History   Socioeconomic History  . Marital status: Married    Spouse name: Not on file  . Number of children: Not on file  . Years of education: Not on file  . Highest education level: Not on file   Occupational History    Employer: [employer].  Tobacco Use  . Smoking status: Never Smoker  . Smokeless tobacco: Never Used  Vaping Use  . Vaping Use: Never used  Substance and Sexual Activity  . Alcohol use: Yes    Comment: maybe 2 glasses a week  . Drug use: No  . Sexual activity: Yes    Partners: Male  Other Topics Concern  . Not on file  Social History Narrative   Married   Orig from [place], [place]   Has been Paediatric nurse   At least 1 daughter   Second home [employer] - enjoys yoga   Social Determinants of Health   Financial Resource Strain: Low Risk   . Difficulty of Paying Living Expenses: Not hard at all  Food Insecurity: No Food Insecurity  . Worried About Charity fundraiser in the Last Year: Never true  . Ran Out of Food in the Last Year: Never true  Transportation Needs: No Transportation Needs  . Lack of Transportation (Medical): No  . Lack of Transportation (Non-Medical): No  Physical Activity: Sufficiently Active  . Days of Exercise per Week: 4 days  . Minutes of Exercise per Session: 60 min  Stress: No Stress Concern Present  . Feeling of Stress : Not at all  Social Connections: Socially Integrated  . Frequency of Communication with  Friends and Family: More than three times a week  . Frequency of Social Gatherings with Friends and Family: More than three times a week  . Attends Religious Services: More than 4 times per year  . Active Member of Clubs or Organizations: Yes  . Attends Archivist Meetings: More than 4 times per year  . Marital Status: Married  Human resources officer Violence: Not At Risk  . Fear of Current or Ex-Partner: No  . Emotionally Abused: No  . Physically Abused: No  . Sexually Abused: No    Family History  Problem Relation Age of Onset  . Arthritis Mother   . Lung cancer Father   . Diabetes Brother        age 68  . Birth defects Brother   . Arthritis Maternal Grandmother    . Heart disease Maternal Grandmother   . Heart attack Paternal Grandmother   . Colon cancer Neg Hx   . Stomach cancer Neg Hx      Immunization History  Administered Date(s) Administered  . Influenza, High Dose Seasonal PF 01/14/2015, 01/03/2018, 01/07/2019  . Influenza-Unspecified 02/19/2020  . PFIZER(Purple Top)SARS-COV-2 Vaccination 05/05/2019, 05/27/2019, 02/08/2020  . Pneumococcal Conjugate-13 10/11/2017  . Pneumococcal Polysaccharide-23 02/25/2019  . Tdap 11/03/2009  . Zoster 12/05/2019    Outpatient Encounter Medications as of 05/07/2020  Medication Sig Note  . Biotin 5000 MCG TABS Take 1 tablet by mouth daily.   . Calcium Carb-Cholecalciferol 600-800 MG-UNIT TABS Take 1 tablet by mouth 2 (two) times daily.   . cetirizine (ZYRTEC) 10 MG tablet Take 10 mg by mouth daily.   . Cholecalciferol (VITAMIN D3) 2000 units TABS Take 1 tablet by mouth daily.   . clonazePAM (KLONOPIN) 0.5 MG tablet  02/10/2020: Twice daily  . Cranberry 400 MG CAPS cranberry   . estradiol (ESTRACE) 0.1 MG/GM vaginal cream Place vaginally.   . fluticasone (FLONASE) 50 MCG/ACT nasal spray Place 1 spray into both nostrils daily as needed for allergies or rhinitis.   . Probiotic Product (ALIGN) 4 MG CAPS Take 1 capsule by mouth daily.   . [DISCONTINUED] augmented betamethasone dipropionate (DIPROLENE-AF) 0.05 % cream APPLY A THIN LAYER AA 2-3 TIMES DAILY (Patient not taking: Reported on 11/06/2019)   . [DISCONTINUED] venlafaxine XR (EFFEXOR-XR) 75 MG 24 hr capsule Take 1 capsule (75 mg total) by mouth daily.    No facility-administered encounter medications on file as of 05/07/2020.     ROS: Gen: no fever, chills  Skin: no rash, itching ENT: no ear pain, ear drainage, nasal congestion, rhinorrhea, sinus pressure, sore throat Eyes: no blurry vision, double vision Resp: no cough, wheeze,SOB CV: no CP, palpitations, LE edema,  GI: no heartburn, n/v/d/c, abd pain GU: no dysuria, urgency, frequency,  hematuria MSK: no joint pain, myalgias, back pain Neuro: no dizziness, headache, weakness, vertigo Psych: no depression, anxiety, insomnia   Allergies  Allergen Reactions  . Wasp Venom Anaphylaxis  . Bee Venom     BP 128/76   Pulse 82   Temp 97.7 F (36.5 C) (Temporal)   Ht 5' 3.75" (1.619 m)   Wt 140 lb 3.2 oz (63.6 kg)   SpO2 98%   BMI 24.25 kg/m    Wt Readings from Last 3 Encounters:  05/07/20 140 lb 3.2 oz (63.6 kg)  02/10/20 141 lb (64 kg)  11/06/19 141 lb 12.8 oz (64.3 kg)   Temp Readings from Last 3 Encounters:  05/07/20 97.7 F (36.5 C) (Temporal)  11/06/19 97.8 F (36.6 C) (  Temporal)  08/26/19 97.9 F (36.6 C) (Temporal)   BP Readings from Last 3 Encounters:  05/07/20 128/76  11/06/19 126/80  09/12/19 110/70   Pulse Readings from Last 3 Encounters:  05/07/20 82  11/06/19 84  08/26/19 81    Physical Exam Constitutional:      General: She is not in acute distress.    Appearance: She is well-developed and well-nourished.  HENT:     Head: Normocephalic and atraumatic.     Right Ear: Tympanic membrane and ear canal normal.     Left Ear: Tympanic membrane and ear canal normal.     Nose: Nose normal.     Mouth/Throat:     Mouth: Oropharynx is clear and moist and mucous membranes are normal.  Eyes:     Conjunctiva/sclera: Conjunctivae normal.     Pupils: Pupils are equal, round, and reactive to light.  Neck:     Thyroid: No thyromegaly.  Cardiovascular:     Rate and Rhythm: Normal rate and regular rhythm.     Pulses: Intact distal pulses.     Heart sounds: Normal heart sounds. No murmur heard.   Pulmonary:     Effort: Pulmonary effort is normal. No respiratory distress.     Breath sounds: Normal breath sounds. No wheezing or rhonchi.  Abdominal:     General: Bowel sounds are normal. There is no distension.     Palpations: Abdomen is soft. There is no mass.     Tenderness: There is no abdominal tenderness.  Musculoskeletal:        General:  No edema.     Cervical back: Neck supple.     Right lower leg: No edema.     Left lower leg: No edema.  Lymphadenopathy:     Cervical: No cervical adenopathy.  Skin:    General: Skin is warm and dry.  Neurological:     Mental Status: She is alert and oriented to person, place, and time.     Motor: No abnormal muscle tone.     Coordination: Coordination normal.  Psychiatric:        Mood and Affect: Mood and affect normal.        Behavior: Behavior normal.      A/P:  1. Annual physical exam - discussed importance of regular CV exercise, healthy diet, adequate sleep - mammo, dexa, colonoscopy UTD - immunizations UTD - dental and vision exams UTD - Basic metabolic panel - AST - ALT - next CPE in 1 year  2. Vitamin D deficiency - pt takes 2000IU daily - VITAMIN D 25 Hydroxy (Vit-D Deficiency, Fractures)  3. Hyperlipidemia, unspecified hyperlipidemia type - not on medication - Lipid panel - AST - ALT  4. GAD (generalized anxiety disorder) - stable, controlled  5. Hyperglycemia - Hemoglobin A1c  6. Spastic dysphonia - follows with neuro at [facility] and has PRN injections Refill: - clonazePAM (KLONOPIN) 0.5 MG tablet; Take 1 tablet (0.5 mg total) by mouth daily.  Dispense: 30 tablet; Refill: 2   This visit occurred during the SARS-CoV-2 public health emergency.  Safety protocols were in place, including screening questions prior to the visit, additional usage of staff PPE, and extensive cleaning of exam room while observing appropriate contact time as indicated for disinfecting solutions.

## 2020-05-08 LAB — BASIC METABOLIC PANEL
BUN/Creatinine Ratio: 21 (calc) (ref 6–22)
BUN: 10 mg/dL (ref 7–25)
CO2: 27 mmol/L (ref 20–32)
Calcium: 9.4 mg/dL (ref 8.6–10.4)
Chloride: 96 mmol/L — ABNORMAL LOW (ref 98–110)
Creat: 0.48 mg/dL — ABNORMAL LOW (ref 0.60–0.93)
Glucose, Bld: 93 mg/dL (ref 65–99)
Potassium: 4.2 mmol/L (ref 3.5–5.3)
Sodium: 132 mmol/L — ABNORMAL LOW (ref 135–146)

## 2020-05-08 LAB — LIPID PANEL
Cholesterol: 267 mg/dL — ABNORMAL HIGH (ref <200)
HDL: 95 mg/dL (ref >=50)
LDL Cholesterol (Calc): 149 mg/dL (calc) — ABNORMAL HIGH
Non-HDL Cholesterol (Calc): 172 mg/dL (calc) — ABNORMAL HIGH (ref <130)
Total CHOL/HDL Ratio: 2.8 (calc) (ref <5.0)
Triglycerides: 117 mg/dL (ref <150)

## 2020-05-08 LAB — HEMOGLOBIN A1C
Hgb A1c MFr Bld: 5.7 % of total Hgb — ABNORMAL HIGH (ref <5.7)
Mean Plasma Glucose: 117 mg/dL
eAG (mmol/L): 6.5 mmol/L

## 2020-05-08 LAB — AST: AST: 18 U/L (ref 10–35)

## 2020-05-08 LAB — VITAMIN D 25 HYDROXY (VIT D DEFICIENCY, FRACTURES): Vit D, 25-Hydroxy: 42 ng/mL (ref 30–100)

## 2020-05-08 LAB — ALT: ALT: 17 U/L (ref 6–29)

## 2020-05-10 ENCOUNTER — Encounter: Payer: Self-pay | Admitting: Family Medicine

## 2020-05-10 DIAGNOSIS — E785 Hyperlipidemia, unspecified: Secondary | ICD-10-CM

## 2020-05-10 NOTE — Telephone Encounter (Signed)
Please see message and advise.  Thank you. ° °

## 2020-05-12 MED ORDER — ATORVASTATIN CALCIUM 40 MG PO TABS: 40.0000 mg | ORAL_TABLET | Freq: Every day | ORAL | 3 refills | Status: DC

## 2020-05-26 ENCOUNTER — Other Ambulatory Visit: Payer: Self-pay | Admitting: Obstetrics and Gynecology

## 2020-05-26 DIAGNOSIS — N6312 Unspecified lump in the right breast, upper inner quadrant: Secondary | ICD-10-CM | POA: Diagnosis not present

## 2020-05-26 DIAGNOSIS — N6001 Solitary cyst of right breast: Secondary | ICD-10-CM

## 2020-07-07 ENCOUNTER — Ambulatory Visit
Admission: RE | Admit: 2020-07-07 | Discharge: 2020-07-07 | Disposition: A | Discharge status: Home / Self Care | Payer: Medicare Other | Source: Ambulatory Visit | Attending: Obstetrics and Gynecology | Admitting: Obstetrics and Gynecology

## 2020-07-07 ENCOUNTER — Other Ambulatory Visit: Payer: Self-pay | Admitting: Obstetrics and Gynecology

## 2020-07-07 ENCOUNTER — Other Ambulatory Visit: Payer: Self-pay

## 2020-07-07 DIAGNOSIS — N6001 Solitary cyst of right breast: Secondary | ICD-10-CM

## 2020-07-07 DIAGNOSIS — N6489 Other specified disorders of breast: Secondary | ICD-10-CM | POA: Diagnosis not present

## 2020-07-07 DIAGNOSIS — R922 Inconclusive mammogram: Secondary | ICD-10-CM | POA: Diagnosis not present

## 2020-08-03 ENCOUNTER — Ambulatory Visit: Payer: Medicare Other | Attending: Internal Medicine

## 2020-08-03 DIAGNOSIS — Z23 Encounter for immunization: Secondary | ICD-10-CM

## 2020-08-03 NOTE — Progress Notes (Signed)
   Covid-19 Vaccination Clinic  Name:  Elisabeth Strom    MRN: [id number] DOB: 17-Aug-1945  08/03/2020  Ms. Kingsford was observed post Covid-19 immunization for 15 minutes without incident. She was provided with Vaccine Information Sheet and instruction to access the V-Safe system.   Ms. Bari was instructed to call 911 with any severe reactions post vaccine: Marland Kitchen Difficulty breathing  . Swelling of face and throat  . A fast heartbeat  . A bad rash all over body  . Dizziness and weakness   Immunizations Administered    Name Date Dose VIS Date Route   Moderna Covid-19 Booster Vaccine 08/03/2020  9:56 AM 0.25 mL 01/21/2020 Intramuscular   Manufacturer: Moderna   Lot: [id number]   [place]: 53005-110-21

## 2020-08-10 ENCOUNTER — Other Ambulatory Visit (HOSPITAL_BASED_OUTPATIENT_CLINIC_OR_DEPARTMENT_OTHER): Payer: Self-pay

## 2020-08-10 DIAGNOSIS — Z23 Encounter for immunization: Secondary | ICD-10-CM | POA: Diagnosis not present

## 2020-08-10 MED ORDER — MODERNA COVID-19 VACCINE 100 MCG/0.5ML IM SUSP
INTRAMUSCULAR | 0 refills | Status: DC
  Filled 2020-08-10: qty 0.25, 1d supply, fill #0

## 2020-09-20 ENCOUNTER — Ambulatory Visit: Payer: Medicare Other | Admitting: Family Medicine

## 2020-09-20 DIAGNOSIS — Z961 Presence of intraocular lens: Secondary | ICD-10-CM | POA: Diagnosis not present

## 2020-09-21 ENCOUNTER — Ambulatory Visit (INDEPENDENT_AMBULATORY_CARE_PROVIDER_SITE_OTHER): Payer: Medicare Other | Admitting: Family Medicine

## 2020-09-21 ENCOUNTER — Other Ambulatory Visit: Payer: Self-pay

## 2020-09-21 ENCOUNTER — Encounter: Payer: Self-pay | Admitting: Family Medicine

## 2020-09-21 DIAGNOSIS — J383 Other diseases of vocal cords: Secondary | ICD-10-CM

## 2020-09-21 DIAGNOSIS — M8580 Other specified disorders of bone density and structure, unspecified site: Secondary | ICD-10-CM | POA: Insufficient documentation

## 2020-09-21 DIAGNOSIS — E785 Hyperlipidemia, unspecified: Secondary | ICD-10-CM | POA: Diagnosis not present

## 2020-09-21 NOTE — Patient Instructions (Addendum)
Great to see you today! Let's plan for follow up around February or March of next year for annual check up and labs.  Please let me know if you need anything in the meantime.

## 2020-09-21 NOTE — Assessment & Plan Note (Signed)
The 10-year ASCVD risk score Mikey Bussing DC Jr., et al., 2013) is: 20.9% Aware of elevated cardiovascular risk.  She would prefer to work on dietary changes to lower cholesterol.  Will check again in 6-8 month.  Discussed alternatives to atorvastatin if this remains elevated.

## 2020-09-21 NOTE — Progress Notes (Signed)
Tamara Pope - 75 y.o. female MRN [id number]  Date of birth: 1946-01-12  Subjective Chief Complaint  Patient presents with   Establish Care    HPI Tamara Pope is a 75 y.o. female here today for initial visit to establish care.  She has been in pretty good health.  She has a history of HLD and spasmodic dysphonia.  She is seen at [facility] for management of her spasmodic dysphonia which is treated with botox and clonazepam.    She was prescribed atorvastatin for treatment of HLD due to elevated ASCVD risk.  She has severe myalgias and was unable to tolerate this.  She would prefer to make changes to diet and see if she can lower this way.    ROS:  A comprehensive ROS was completed and negative except as noted per HPI  Allergies  Allergen Reactions   Bee Venom Anaphylaxis   Wasp Venom Anaphylaxis    Past Medical History:  Diagnosis Date   Allergy    Arthritis    Dysphonia, spasmodic    GERD (gastroesophageal reflux disease)    History of DVT (deep vein thrombosis)    JAN 2002-- S/P BREAST IMPLANTS  RIGHT UPPER ARM (AXILLARY/ SUBCLAVIN VEIN)   PMB (postmenopausal bleeding)     Past Surgical History:  Procedure Laterality Date   APPENDECTOMY  AS  CHILD   AUGMENTATION MAMMAPLASTY Bilateral    20 years ago   BILATERAL BREAST LIFT AND IMPLANT REDUCTION  SEPT 2014   BREAST ENHANCEMENT SURGERY Bilateral NOV 2001   CATARACT EXTRACTION W/ INTRAOCULAR LENS  IMPLANT, BILATERAL     DILATATION & CURRETTAGE/HYSTEROSCOPY WITH RESECTOCOPE N/A 04/18/2013   Procedure: DILATATION & CURETTAGE/HYSTEROSCOPY ;  Surgeon: [name], MD;  Location: [place];  Service: Gynecology;  Laterality: N/A;   KNEE ARTHROSCOPY W/ MENISCECTOMY Right 2011   ROTATOR CUFF REPAIR Right 2005   skin surgery     TONSILLECTOMY  AS CHILD   TOTAL KNEE ARTHROPLASTY Right 05/03/2015   Procedure: RIGHT TOTAL KNEE ARTHROPLASTY;  Surgeon: Vickey Huger, MD;  Location: [place];  Service:  Orthopedics;  Laterality: Right;    Social History   Socioeconomic History   Marital status: Married    Spouse name: Not on file   Number of children: Not on file   Years of education: Not on file   Highest education level: Not on file  Occupational History    Employer: [employer].  Tobacco Use   Smoking status: Never   Smokeless tobacco: Never  Vaping Use   Vaping Use: Never used  Substance and Sexual Activity   Alcohol use: Yes    Alcohol/week: 5.0 standard drinks    Types: 5 Standard drinks or equivalent per week   Drug use: No   Sexual activity: Yes    Partners: Male  Other Topics Concern   Not on file  Social History Narrative   Married   Orig from [place], [place]   Has been Event organiser of Chief Strategy Officer   At least 1 daughter   Second home [employer] - enjoys yoga   Social Determinants of Health   Financial Resource Strain: Low Risk    Difficulty of Paying Living Expenses: Not hard at all  Food Insecurity: No Food Insecurity   Worried About Charity fundraiser in the Last Year: Never true   Arboriculturist in the Last Year: Never true  Transportation Needs: No Transportation Needs   Lack  of Transportation (Medical): No   Lack of Transportation (Non-Medical): No  Physical Activity: Sufficiently Active   Days of Exercise per Week: 4 days   Minutes of Exercise per Session: 60 min  Stress: No Stress Concern Present   Feeling of Stress : Not at all  Social Connections: Socially Integrated   Frequency of Communication with Friends and Family: More than three times a week   Frequency of Social Gatherings with Friends and Family: More than three times a week   Attends Religious Services: More than 4 times per year   Active Member of [employer] or Organizations: Yes   Attends Music therapist: More than 4 times per year   Marital Status: Married    Family History  Problem Relation Age of Onset   Arthritis Mother    Lung cancer  Father    Diabetes Brother        age 64   Birth defects Brother    Arthritis Maternal Grandmother    Heart disease Maternal Grandmother    Heart attack Paternal Grandmother    Colon cancer Neg Hx    Stomach cancer Neg Hx     Health Maintenance  Topic Date Due   TETANUS/TDAP  11/04/2019   INFLUENZA VACCINE  11/01/2020   COVID-19 Vaccine (5 - Booster for [place] series) 12/04/2020   DEXA SCAN  Completed   Hepatitis C Screening  Completed   PNA vac Low Risk Adult  Completed   Zoster Vaccines- Shingrix  Completed   HPV VACCINES  Aged Out     ----------------------------------------------------------------------------------------------------------------------------------------------------------------------------------------------------------------- Physical Exam BP 137/69 (BP Location: Left Arm, Patient Position: Sitting, Cuff Size: Normal)   Pulse 89   Temp 97.8 F (36.6 C)   Ht 5' 3.19" (1.605 m)   Wt 138 lb 6.4 oz (62.8 kg)   SpO2 98%   BMI 24.37 kg/m   Physical Exam Constitutional:      Appearance: Normal appearance.  HENT:     Head: Normocephalic and atraumatic.  Eyes:     General: No scleral icterus. Cardiovascular:     Rate and Rhythm: Normal rate and regular rhythm.  Pulmonary:     Effort: Pulmonary effort is normal.     Breath sounds: Normal breath sounds.  Musculoskeletal:     Cervical back: Neck supple.  Skin:    General: Skin is warm and dry.  Neurological:     General: No focal deficit present.     Mental Status: She is alert.  Psychiatric:        Mood and Affect: Mood normal.        Behavior: Behavior normal.    ------------------------------------------------------------------------------------------------------------------------------------------------------------------------------------------------------------------- Assessment and Plan  Spasmodic dysphonia Management per Dr. Patrice Paradise at [facility] (hyperlipidemia) The 10-year ASCVD risk  score Mikey Bussing DC Jr., et al., 2013) is: 20.9% Aware of elevated cardiovascular risk.  She would prefer to work on dietary changes to lower cholesterol.  Will check again in 6-8 month.  Discussed alternatives to atorvastatin if this remains elevated.     No orders of the defined types were placed in this encounter.   No follow-ups on file.    This visit occurred during the SARS-CoV-2 public health emergency.  Safety protocols were in place, including screening questions prior to the visit, additional usage of staff PPE, and extensive cleaning of exam room while observing appropriate contact time as indicated for disinfecting solutions.

## 2020-09-21 NOTE — Assessment & Plan Note (Signed)
Management per Dr. Patrice Paradise at [facility]

## 2020-10-12 DIAGNOSIS — H16223 Keratoconjunctivitis sicca, not specified as Sjogren's, bilateral: Secondary | ICD-10-CM | POA: Diagnosis not present

## 2020-12-01 DIAGNOSIS — L821 Other seborrheic keratosis: Secondary | ICD-10-CM | POA: Diagnosis not present

## 2020-12-01 DIAGNOSIS — L720 Epidermal cyst: Secondary | ICD-10-CM | POA: Diagnosis not present

## 2020-12-01 DIAGNOSIS — L814 Other melanin hyperpigmentation: Secondary | ICD-10-CM | POA: Diagnosis not present

## 2020-12-01 DIAGNOSIS — D1801 Hemangioma of skin and subcutaneous tissue: Secondary | ICD-10-CM | POA: Diagnosis not present

## 2020-12-01 DIAGNOSIS — Z85828 Personal history of other malignant neoplasm of skin: Secondary | ICD-10-CM | POA: Diagnosis not present

## 2020-12-01 DIAGNOSIS — D225 Melanocytic nevi of trunk: Secondary | ICD-10-CM | POA: Diagnosis not present

## 2020-12-01 DIAGNOSIS — L603 Nail dystrophy: Secondary | ICD-10-CM | POA: Diagnosis not present

## 2020-12-03 ENCOUNTER — Other Ambulatory Visit (HOSPITAL_BASED_OUTPATIENT_CLINIC_OR_DEPARTMENT_OTHER): Payer: Self-pay

## 2020-12-08 DIAGNOSIS — J385 Laryngeal spasm: Secondary | ICD-10-CM | POA: Diagnosis not present

## 2020-12-16 DIAGNOSIS — R102 Pelvic and perineal pain: Secondary | ICD-10-CM | POA: Diagnosis not present

## 2020-12-16 DIAGNOSIS — N952 Postmenopausal atrophic vaginitis: Secondary | ICD-10-CM | POA: Diagnosis not present

## 2021-01-18 DIAGNOSIS — H16223 Keratoconjunctivitis sicca, not specified as Sjogren's, bilateral: Secondary | ICD-10-CM | POA: Diagnosis not present

## 2021-01-20 DIAGNOSIS — Z23 Encounter for immunization: Secondary | ICD-10-CM | POA: Diagnosis not present

## 2021-01-31 DIAGNOSIS — N959 Unspecified menopausal and perimenopausal disorder: Secondary | ICD-10-CM | POA: Diagnosis not present

## 2021-01-31 DIAGNOSIS — Z6823 Body mass index (BMI) 23.0-23.9, adult: Secondary | ICD-10-CM | POA: Diagnosis not present

## 2021-01-31 DIAGNOSIS — Z01419 Encounter for gynecological examination (general) (routine) without abnormal findings: Secondary | ICD-10-CM | POA: Diagnosis not present

## 2021-02-08 ENCOUNTER — Other Ambulatory Visit: Payer: Self-pay

## 2021-02-08 ENCOUNTER — Ambulatory Visit (INDEPENDENT_AMBULATORY_CARE_PROVIDER_SITE_OTHER): Payer: Medicare Other | Admitting: Family Medicine

## 2021-02-08 ENCOUNTER — Encounter: Payer: Self-pay | Admitting: Family Medicine

## 2021-02-08 DIAGNOSIS — I10 Essential (primary) hypertension: Secondary | ICD-10-CM

## 2021-02-08 DIAGNOSIS — J383 Other diseases of vocal cords: Secondary | ICD-10-CM

## 2021-02-08 HISTORY — DX: Essential (primary) hypertension: I10

## 2021-02-08 MED ORDER — PROPRANOLOL HCL ER 60 MG PO CP24: 60.0000 mg | ORAL_CAPSULE | Freq: Every day | ORAL | 3 refills | Status: DC

## 2021-02-08 NOTE — Assessment & Plan Note (Signed)
This is managed by Dr. Patrice Paradise at [facility].  She remains on clonazepam to help with management.

## 2021-02-08 NOTE — Assessment & Plan Note (Signed)
She has had elevated blood pressures with situational worsening.  Her baseline readings tend to be elevated as well.  We will add propranolol ER at 60 mg daily.  This has potential side effects with medication.  Return in about 8 weeks (around 04/05/2021).

## 2021-02-08 NOTE — Progress Notes (Signed)
Tamara Pope - 75 y.o. female MRN [id number]  Date of birth: 01-19-1946  Subjective Chief Complaint  Patient presents with   Anxiety   Hypertension    HPI Tamara Pope is a 75 year old female here today with complaint of elevated blood pressure.  She has noted elevated blood pressure readings over the past several weeks.  This seems to be related to increased anxiety regarding her spasmodic dysphonia.  Readings have been as high as 160/90.  Reports that she is able to calm herself down these do improved to approximately 140/80.  She has not had any symptoms related to this including chest pain, shortness of breath, palpitations, headache or vision changes.  She does take clonazepam to help with management of her spasmodic dysphonia which is helpful.  It does help some with her anxiety.  She had previously taken beta-blockers for headaches in the past and tolerated this well.  ROS:  A comprehensive ROS was completed and negative except as noted per HPI   Allergies  Allergen Reactions   Bee Venom Anaphylaxis   Wasp Venom Anaphylaxis    Past Medical History:  Diagnosis Date   Allergy    Arthritis    Dysphonia, spasmodic    GERD (gastroesophageal reflux disease)    History of DVT (deep vein thrombosis)    JAN 2002-- S/P BREAST IMPLANTS  RIGHT UPPER ARM (AXILLARY/ SUBCLAVIN VEIN)   PMB (postmenopausal bleeding)     Past Surgical History:  Procedure Laterality Date   APPENDECTOMY  AS  CHILD   AUGMENTATION MAMMAPLASTY Bilateral    20 years ago   BILATERAL BREAST LIFT AND IMPLANT REDUCTION  SEPT 2014   BREAST ENHANCEMENT SURGERY Bilateral NOV 2001   CATARACT EXTRACTION W/ INTRAOCULAR LENS  IMPLANT, BILATERAL     DILATATION & CURRETTAGE/HYSTEROSCOPY WITH RESECTOCOPE N/A 04/18/2013   Procedure: DILATATION & CURETTAGE/HYSTEROSCOPY ;  Surgeon: [name], MD;  Location: [facility];  Service: Gynecology;  Laterality: N/A;   KNEE ARTHROSCOPY W/ MENISCECTOMY Right  2011   ROTATOR CUFF REPAIR Right 2005   skin surgery     TONSILLECTOMY  AS CHILD   TOTAL KNEE ARTHROPLASTY Right 05/03/2015   Procedure: RIGHT TOTAL KNEE ARTHROPLASTY;  Surgeon: Vickey Huger, MD;  Location: [facility];  Service: Orthopedics;  Laterality: Right;    Social History   Socioeconomic History   Marital status: Married    Spouse name: Not on file   Number of children: Not on file   Years of education: Not on file   Highest education level: Not on file  Occupational History    Employer: [employer]  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[employer]   At least 1 daughter   Second home [employer] - enjoys yoga   Social Determinants of Health   Financial Resource Strain: Low Risk    Difficulty of Paying Living Expenses: Not hard at all  Food Insecurity: No Food Insecurity   Worried About Charity fundraiser  in the Last Year: Never true   [place] in the Last Year: Never true  Transportation Needs: No Transportation Needs   Lack of Transportation (Medical): No   Lack of Transportation (Non-Medical): No  Physical Activity: Sufficiently Active   Days of Exercise per Week: 4 days   Minutes of Exercise per Session: 60 min  Stress: No Stress Concern Present   Feeling of Stress : Not at all  Social Connections: Socially Integrated   Frequency of Communication with Friends and Family: More than three times a week   Frequency of Social Gatherings with Friends and Family: More than three times a week   Attends Religious Services: More than 4 times per year    Active Member of [employer] or Organizations: Yes   Attends Music therapist: More than 4 times per year   Marital Status: Married    Family History  Problem Relation Age of Onset   Arthritis Mother    Lung cancer Father    Diabetes Brother        age 51   Birth defects Brother    Arthritis Maternal Grandmother    Heart disease Maternal Grandmother    Heart attack Paternal Grandmother    Colon cancer Neg Hx    Stomach cancer Neg Hx     Health Maintenance  Topic Date Due   TETANUS/TDAP  11/04/2019   COVID-19 Vaccine (4 - Booster for Pfizer series) 09/28/2020   Pneumonia Vaccine 4+ Years old  Completed   INFLUENZA VACCINE  Completed   DEXA SCAN  Completed   Hepatitis C Screening  Completed   Zoster Vaccines- Shingrix  Completed   HPV VACCINES  Aged Out     ----------------------------------------------------------------------------------------------------------------------------------------------------------------------------------------------------------------- Physical Exam BP (!) 153/75 (BP Location: Right Arm, Patient Position: Sitting, Cuff Size: Small)   Pulse 71   Ht 5\' 3"  (1.6 m)   Wt 138 lb (62.6 kg)   SpO2 96%   BMI 24.45 kg/m   Physical Exam Constitutional:      Appearance: Normal appearance.  Eyes:     General: No scleral icterus. Cardiovascular:     Rate and Rhythm: Normal rate and regular rhythm.  Pulmonary:     Effort: Pulmonary effort is normal.     Breath sounds: Normal breath sounds.  Musculoskeletal:     Cervical back: Neck supple.  Neurological:     General: No focal deficit present.     Mental Status: She is alert.  Psychiatric:        Mood and Affect: Mood normal.        Behavior: Behavior normal.    ------------------------------------------------------------------------------------------------------------------------------------------------------------------------------------------------------------------- Assessment and  Plan  Essential hypertension She has had elevated blood pressures with situational worsening.  Her baseline readings tend to be elevated as well.  We will add propranolol ER at 60 mg daily.  This has potential side effects with medication.  Return in about 8 weeks (around 04/05/2021).   Spasmodic dysphonia This is managed by Dr. Patrice Paradise at [facility].  She remains on clonazepam to help with management.     Meds ordered this encounter  Medications   propranolol ER (INDERAL LA) 60 MG 24 hr capsule    Sig: Take 1 capsule (60 mg total) by mouth daily.    Dispense:  30 capsule    Refill:  3    Return in about 8 weeks (around 04/05/2021).    This visit occurred during the SARS-CoV-2 public health emergency.  Safety protocols were  in place, including screening questions prior to the visit, additional usage of staff PPE, and extensive cleaning of exam room while observing appropriate contact time as indicated for disinfecting solutions.

## 2021-02-08 NOTE — Patient Instructions (Signed)
Managing Your Hypertension Hypertension, also called high blood pressure, is when the force of the blood pressing against the walls of the arteries is too strong. Arteries are blood vessels that carry blood from your heart throughout your body. Hypertension forces the heart to work harder to pump blood and may cause the arteries to become narrow or stiff. Understanding blood pressure readings Your personal target blood pressure may vary depending on your medical conditions, your age, and other factors. A blood pressure reading includes a higher number over a lower number. Ideally, your blood pressure should be below 120/80. You should know that: The first, or top, number is called the systolic pressure. It is a measure of the pressure in your arteries as your heart beats. The second, or bottom number, is called the diastolic pressure. It is a measure of the pressure in your arteries as the heart relaxes. Blood pressure is classified into four stages. Based on your blood pressure reading, your health care provider may use the following stages to determine what type of treatment you need, if any. Systolic pressure and diastolic pressure are measured in a unit called mmHg. Normal Systolic pressure: below 120. Diastolic pressure: below 80. Elevated Systolic pressure: 120-129. Diastolic pressure: below 80. Hypertension stage 1 Systolic pressure: 130-139. Diastolic pressure: 80-89. Hypertension stage 2 Systolic pressure: 140 or above. Diastolic pressure: 90 or above. How can this condition affect me? Managing your hypertension is an important responsibility. Over time, hypertension can damage the arteries and decrease blood flow to important parts of the body, including the brain, heart, and kidneys. Having untreated or uncontrolled hypertension can lead to: A heart attack. A stroke. A weakened blood vessel (aneurysm). Heart failure. Kidney damage. Eye damage. Metabolic syndrome. Memory and  concentration problems. Vascular dementia. What actions can I take to manage this condition? Hypertension can be managed by making lifestyle changes and possibly by taking medicines. Your health care provider will help you make a plan to bring your blood pressure within a normal range. Nutrition  Eat a diet that is high in fiber and potassium, and low in salt (sodium), added sugar, and fat. An example eating plan is called the Dietary Approaches to Stop Hypertension (DASH) diet. To eat this way: Eat plenty of fresh fruits and vegetables. Try to fill one-half of your plate at each meal with fruits and vegetables. Eat whole grains, such as whole-wheat pasta, brown rice, or whole-grain bread. Fill about one-fourth of your plate with whole grains. Eat low-fat dairy products. Avoid fatty cuts of meat, processed or cured meats, and poultry with skin. Fill about one-fourth of your plate with lean proteins such as fish, chicken without skin, beans, eggs, and tofu. Avoid pre-made and processed foods. These tend to be higher in sodium, added sugar, and fat. Reduce your daily sodium intake. Most people with hypertension should eat less than 1,500 mg of sodium a day. Lifestyle  Work with your health care provider to maintain a healthy body weight or to lose weight. Ask what an ideal weight is for you. Get at least 30 minutes of exercise that causes your heart to beat faster (aerobic exercise) most days of the week. Activities may include walking, swimming, or biking. Include exercise to strengthen your muscles (resistance exercise), such as weight lifting, as part of your weekly exercise routine. Try to do these types of exercises for 30 minutes at least 3 days a week. Do not use any products that contain nicotine or tobacco, such as cigarettes, e-cigarettes,   and chewing tobacco. If you need help quitting, ask your health care provider. Control any long-term (chronic) conditions you have, such as high  cholesterol or diabetes. Identify your sources of stress and find ways to manage stress. This may include meditation, deep breathing, or making time for fun activities. Alcohol use Do not drink alcohol if: Your health care provider tells you not to drink. You are pregnant, may be pregnant, or are planning to become pregnant. If you drink alcohol: Limit how much you use to: 0-1 drink a day for women. 0-2 drinks a day for men. Be aware of how much alcohol is in your drink. In the [place] one drink equals one 12 oz bottle of beer (355 mL), one 5 oz glass of wine (148 mL), or one 1 oz glass of hard liquor (44 mL). Medicines Your health care provider may prescribe medicine if lifestyle changes are not enough to get your blood pressure under control and if: Your systolic blood pressure is 130 or higher. Your diastolic blood pressure is 80 or higher. Take medicines only as told by your health care provider. Follow the directions carefully. Blood pressure medicines must be taken as told by your health care provider. The medicine does not work as well when you skip doses. Skipping doses also puts you at risk for problems. Monitoring Before you monitor your blood pressure: Do not smoke, drink caffeinated beverages, or exercise within 30 minutes before taking a measurement. Use the bathroom and empty your bladder (urinate). Sit quietly for at least 5 minutes before taking measurements. Monitor your blood pressure at home as told by your health care provider. To do this: Sit with your back straight and supported. Place your feet flat on the floor. Do not cross your legs. Support your arm on a flat surface, such as a table. Make sure your upper arm is at heart level. Each time you measure, take two or three readings one minute apart and record the results. You may also need to have your blood pressure checked regularly by your health care provider. General information Talk with your health care  provider about your diet, exercise habits, and other lifestyle factors that may be contributing to hypertension. Review all the medicines you take with your health care provider because there may be side effects or interactions. Keep all visits as told by your health care provider. Your health care provider can help you create and adjust your plan for managing your high blood pressure. Where to find more information National Heart, Lung, and Blood Institute: www.nhlbi.nih.gov American Heart Association: www.heart.org Contact a health care provider if: You think you are having a reaction to medicines you have taken. You have repeated (recurrent) headaches. You feel dizzy. You have swelling in your ankles. You have trouble with your vision. Get help right away if: You develop a severe headache or confusion. You have unusual weakness or numbness, or you feel faint. You have severe pain in your chest or abdomen. You vomit repeatedly. You have trouble breathing. These symptoms may represent a serious problem that is an emergency. Do not wait to see if the symptoms will go away. Get medical help right away. Call your local emergency services (911 in the [place].S[place] Do not drive yourself to the hospital. Summary Hypertension is when the force of blood pumping through your arteries is too strong. If this condition is not controlled, it may put you at risk for serious complications. Your personal target blood pressure may vary depending on   your medical conditions, your age, and other factors. For most people, a normal blood pressure is less than 120/80. Hypertension is managed by lifestyle changes, medicines, or both. Lifestyle changes to help manage hypertension include losing weight, eating a healthy, low-sodium diet, exercising more, stopping smoking, and limiting alcohol. This information is not intended to replace advice given to you by your health care provider. Make sure you discuss any questions  you have with your health care provider. Document Revised: 04/07/2019 Document Reviewed: 02/18/2019 Elsevier Patient Education  2022 Elsevier Inc.  

## 2021-02-09 DIAGNOSIS — Z23 Encounter for immunization: Secondary | ICD-10-CM | POA: Diagnosis not present

## 2021-02-16 ENCOUNTER — Telehealth: Payer: Self-pay

## 2021-02-16 NOTE — Telephone Encounter (Signed)
Let's have her discontinue for now.  Monitor BP off of medication for the next week and call with readings.

## 2021-02-16 NOTE — Telephone Encounter (Signed)
Task completed. Left a detailed vm msg for patient of provider's recommendation. Informed to return a call back to the clinic with blood pressure readings in one week. Direct call back info provided.

## 2021-02-16 NOTE — Telephone Encounter (Signed)
Called concerning propranolol ER. Having difficulty sleeping. Making her more jittery. Last taken last night.

## 2021-04-11 ENCOUNTER — Ambulatory Visit (INDEPENDENT_AMBULATORY_CARE_PROVIDER_SITE_OTHER): Payer: Medicare Other | Admitting: Family Medicine

## 2021-04-11 ENCOUNTER — Encounter: Payer: Self-pay | Admitting: Family Medicine

## 2021-04-11 ENCOUNTER — Other Ambulatory Visit: Payer: Self-pay

## 2021-04-11 DIAGNOSIS — F411 Generalized anxiety disorder: Secondary | ICD-10-CM | POA: Diagnosis not present

## 2021-04-11 DIAGNOSIS — I10 Essential (primary) hypertension: Secondary | ICD-10-CM | POA: Diagnosis not present

## 2021-04-11 MED ORDER — DIAZEPAM 5 MG PO TABS: 5.0000 mg | ORAL_TABLET | Freq: Three times a day (TID) | ORAL | 1 refills | Status: DC | PRN

## 2021-04-11 NOTE — Progress Notes (Signed)
Tamara Pope - 76 y.o. female MRN [id number]  Date of birth: 10-29-1945  Subjective Chief Complaint  Patient presents with   Hypertension    HPI Tamara Pope is an extremely pleasant 76 year old female here today for follow-up of elevated blood pressure.  At her previous appointment she was started on propranolol ER as anxiety seem to be contributing to her elevated blood pressures.  She felt jittery with this and has discontinued.  She has been checking her blood pressure at home since that time and these have improved quite a bit.  Readings at home are averaging in the 120s to 130s/60s to 70s.  She denies any symptoms related to hypertension including chest pain, shortness of breath, palpitation, headaches or vision changes.  ROS:  A comprehensive ROS was completed and negative except as noted per HPI    Allergies  Allergen Reactions   Bee Venom Anaphylaxis   Wasp Venom Anaphylaxis    Past Medical History:  Diagnosis Date   Allergy    Arthritis    Dysphonia, spasmodic    GERD (gastroesophageal reflux disease)    History of DVT (deep vein thrombosis)    JAN 2002-- S/P BREAST IMPLANTS  RIGHT UPPER ARM (AXILLARY/ SUBCLAVIN VEIN)   PMB (postmenopausal bleeding)     Past Surgical History:  Procedure Laterality Date   APPENDECTOMY  AS  CHILD   AUGMENTATION MAMMAPLASTY Bilateral    20 years ago   BILATERAL BREAST LIFT AND IMPLANT REDUCTION  SEPT 2014   BREAST ENHANCEMENT SURGERY Bilateral NOV 2001   CATARACT EXTRACTION W/ INTRAOCULAR LENS  IMPLANT, BILATERAL     DILATATION & CURRETTAGE/HYSTEROSCOPY WITH RESECTOCOPE N/A 04/18/2013   Procedure: DILATATION & CURETTAGE/HYSTEROSCOPY ;  Surgeon: [name], MD;  Location: [facility];  Service: Gynecology;  Laterality: N/A;   KNEE ARTHROSCOPY W/ MENISCECTOMY Right 2011   ROTATOR CUFF REPAIR Right 2005   skin surgery     TONSILLECTOMY  AS CHILD   TOTAL KNEE ARTHROPLASTY Right 05/03/2015   Procedure: RIGHT  TOTAL KNEE ARTHROPLASTY;  Surgeon: Vickey Huger, MD;  Location: [facility];  Service: Orthopedics;  Laterality: Right;    Social History   Socioeconomic History   Marital status: Married    Spouse name: Not on file   Number of children: Not on file   Years of education: Not on file   Highest education level: Not on file  Occupational History    Employer: [employer]  Tobacco Use   Smoking status: Never   Smokeless tobacco: Never  Vaping Use   Vaping Use: Never used  Substance and Sexual Activity   Alcohol use: Yes    Alcohol/week: 5.0 standard drinks    Types: 5 Standard drinks or equivalent per week   Drug use: No   Sexual activity: Yes    Partners: Male  Other Topics Concern   Not on file  Social History Narrative   Married   Orig from [place], [place]   Has been Event organiser of [employer]   At least 1 daughter   Second home [employer] - enjoys yoga   Social Determinants of Health   Financial Resource Strain: Not on file  Food Insecurity: Not on file  Transportation Needs: Not on file  Physical Activity: Not on file  Stress: Not on file  Social Connections: Not on file    Family History  Problem Relation Age of Onset   Arthritis Mother    Lung cancer Father  Diabetes Brother        age 60   Birth defects Brother    Arthritis Maternal Grandmother    Heart disease Maternal Grandmother    Heart attack Paternal Grandmother    Colon cancer Neg Hx    Stomach cancer Neg Hx     Health Maintenance  Topic Date Due   TETANUS/TDAP  11/04/2019   COVID-19 Vaccine (4 - Booster for [employer] series) 09/28/2020   Pneumonia Vaccine 4+ Years old  Completed   INFLUENZA VACCINE  Completed   DEXA SCAN  Completed   Hepatitis C Screening  Completed   Zoster Vaccines- Shingrix  Completed   HPV VACCINES  Aged Out   COLONOSCOPY (Pts 45-11yrs Insurance coverage will need to be confirmed)  Discontinued      ----------------------------------------------------------------------------------------------------------------------------------------------------------------------------------------------------------------- Physical Exam BP (!) 149/82 (BP Location: Left Arm, Patient Position: Sitting, Cuff Size: Small)    Pulse 77    Temp 98 F (36.7 C)    Ht 5\' 3"  (1.6 m)    Wt 137 lb (62.1 kg)    SpO2 97%    BMI 24.27 kg/m   Physical Exam Constitutional:      Appearance: Normal appearance.  Skin:    General: Skin is warm and dry.  Neurological:     Mental Status: She is alert.    ------------------------------------------------------------------------------------------------------------------------------------------------------------------------------------------------------------------- Assessment and Plan  GAD (generalized anxiety disorder) Using diazepam as needed on rare occasions when traveling or for severe anxiety.  #10 refilled and this typically lasts her for several months.  Essential hypertension Blood pressure readings at home are well controlled.  I discussed having her continue to monitor her home blood pressures for now.  I do not think we need to add on any additional medication at this time.   Meds ordered this encounter  Medications   diazepam (VALIUM) 5 MG tablet    Sig: Take 1 tablet (5 mg total) by mouth every 8 (eight) hours as needed for anxiety. Take 30 min prior to travel    Dispense:  10 tablet    Refill:  1    Return in about 6 months (around 10/09/2021) for BP .    This visit occurred during the SARS-CoV-2 public health emergency.  Safety protocols were in place, including screening questions prior to the visit, additional usage of staff PPE, and extensive cleaning of exam room while observing appropriate contact time as indicated for disinfecting solutions.

## 2021-04-11 NOTE — Assessment & Plan Note (Signed)
Using diazepam as needed on rare occasions when traveling or for severe anxiety.  #10 refilled and this typically lasts her for several months.

## 2021-04-11 NOTE — Assessment & Plan Note (Signed)
Blood pressure readings at home are well controlled.  I discussed having her continue to monitor her home blood pressures for now.  I do not think we need to add on any additional medication at this time.

## 2021-05-05 DIAGNOSIS — Z1231 Encounter for screening mammogram for malignant neoplasm of breast: Secondary | ICD-10-CM | POA: Diagnosis not present

## 2021-05-25 DIAGNOSIS — J385 Laryngeal spasm: Secondary | ICD-10-CM | POA: Diagnosis not present

## 2021-06-15 DIAGNOSIS — R350 Frequency of micturition: Secondary | ICD-10-CM | POA: Diagnosis not present

## 2021-06-15 DIAGNOSIS — R300 Dysuria: Secondary | ICD-10-CM | POA: Diagnosis not present

## 2021-06-15 DIAGNOSIS — Z87440 Personal history of urinary (tract) infections: Secondary | ICD-10-CM | POA: Diagnosis not present

## 2021-06-24 ENCOUNTER — Ambulatory Visit (INDEPENDENT_AMBULATORY_CARE_PROVIDER_SITE_OTHER): Payer: Medicare Other | Admitting: Family Medicine

## 2021-06-24 DIAGNOSIS — Z0000 Encounter for general adult medical examination without abnormal findings: Secondary | ICD-10-CM | POA: Diagnosis not present

## 2021-06-24 NOTE — Progress Notes (Signed)
? ? ?MEDICARE ANNUAL WELLNESS VISIT ? ?06/24/2021 ? ?Telephone Visit Disclaimer ?This Medicare AWV was conducted by telephone due to national recommendations for restrictions regarding the COVID-19 Pandemic (e.g. social distancing).  I verified, using two identifiers, that I am speaking with Tamara Pope or their authorized healthcare agent. I discussed the limitations, risks, security, and privacy concerns of performing an evaluation and management service by telephone and the potential availability of an in-person appointment in the future. The patient expressed understanding and agreed to proceed.  ?Location of Patient: Home ?Location of Provider (nurse):  Provider home ? ?Subjective:  ? ? ?Tamara Pope is a 76 y.o. female patient of Luetta Nutting, DO who had a Medicare Annual Wellness Visit today via telephone. Matisyn is Retired and lives with their spouse. she has 2 children. she reports that she is socially active and does interact with friends/family regularly. she is moderately physically active and enjoys dancing and going out to different restaurants. ? ?Patient Care Team: ?Luetta Nutting, DO as PCP - General (Family Medicine) ?Molli Posey, MD as Consulting Physician (Obstetrics and Gynecology) ? ? ?  06/24/2021  ?  2:48 PM 09/21/2020  ? 10:23 AM 02/10/2020  ?  8:22 AM 01/29/2019  ?  1:56 PM 12/19/2017  ?  9:26 AM 04/19/2015  ?  9:06 AM 04/18/2013  ?  7:22 AM  ?Advanced Directives  ?Does Patient Have a Medical Advance Directive? Yes Yes Yes Yes Yes Yes Patient has advance directive, copy not in chart  ?Type of Advance Directive Living will [place];Living will;Out of facility DNR (pink MOST or yellow form) [place];Living will [place];Living will [place];Living will [place];Living will [place];Living will  ?Does patient want to make changes to medical advance  directive? No - Patient declined   No - Patient declined  No - Patient declined No  ?Copy of [place] in Chart?  No - copy requested Yes - validated most recent copy scanned in chart (See row information) Yes - validated most recent copy scanned in chart (See row information) No - copy requested No - copy requested Copy requested from family  ? ? ?Hospital Utilization Over the Past 12 Months: ?# of hospitalizations or ER visits: 0 ?# of surgeries: 0 ? ?Review of Systems    ?Patient reports that her overall health is unchanged compared to last year. ? ?History obtained from chart review and the patient ? ?Patient Reported Readings (BP, Pulse, CBG, Weight, etc) ?none ? ?Pain Assessment ?Pain : No/denies pain ? ?  ? ?Current Medications & Allergies (verified) ?Allergies as of 06/24/2021   ? ?   Reactions  ? Bee Venom Anaphylaxis  ? Wasp Venom Anaphylaxis  ? ?  ? ?  ?Medication List  ?  ? ?  ? Accurate as of June 24, 2021  3:05 PM. If you have any questions, ask your nurse or doctor.  ?  ?  ? ?  ? ?Align 4 MG Caps ?Take 1 capsule by mouth daily. ?  ?Biotin 5000 MCG Tabs ?Take 1 tablet by mouth daily. ?  ?Calcium Carb-Cholecalciferol 600-800 MG-UNIT Tabs ?Take 1 tablet by mouth 2 (two) times daily. ?  ?clonazePAM 0.5 MG tablet ?Commonly known as: KLONOPIN ?Take 1 tablet (0.5 mg total) by mouth daily. ?  ?Cranberry 400 MG Caps ?cranberry ?  ?cycloSPORINE 0.05 % ophthalmic emulsion ?Commonly known as: RESTASIS ?1 drop 2 (  two) times daily. ?  ?diazepam 5 MG tablet ?Commonly known as: VALIUM ?Take 1 tablet (5 mg total) by mouth every 8 (eight) hours as needed for anxiety. Take 30 min prior to travel ?  ?estradiol 0.1 MG/GM vaginal cream ?Commonly known as: ESTRACE ?Place vaginally as needed. ?  ?fluticasone 50 MCG/ACT nasal spray ?Commonly known as: FLONASE ?Place 1 spray into both nostrils daily as needed for allergies or rhinitis. ?  ?Vitamin D3 50 MCG (2000 UT) Tabs ?Take 1 tablet by mouth daily. ?   ? ?  ? ? ?History (reviewed): ?Past Medical History:  ?Diagnosis Date  ? Allergy   ? Anxiety 1990  ? Arthritis   ? Dysphonia, spasmodic   ? GERD (gastroesophageal reflux disease)   ? History of DVT (deep vein thrombosis)   ? JAN 2002-- S/P BREAST IMPLANTS  RIGHT UPPER ARM (AXILLARY/ SUBCLAVIN VEIN)  ? PMB (postmenopausal bleeding)   ? ?Past Surgical History:  ?Procedure Laterality Date  ? APPENDECTOMY  AS  CHILD  ? AUGMENTATION MAMMAPLASTY Bilateral   ? 20 years ago  ? BILATERAL BREAST LIFT AND IMPLANT REDUCTION  12/2012  ? BREAST ENHANCEMENT SURGERY Bilateral 02/2000  ? CATARACT EXTRACTION W/ INTRAOCULAR LENS  IMPLANT, BILATERAL    ? COSMETIC SURGERY  1995  ? breast implants  ? DILATATION & CURRETTAGE/HYSTEROSCOPY WITH RESECTOCOPE N/A 04/18/2013  ? Procedure: DILATATION & CURETTAGE/HYSTEROSCOPY ;  Surgeon: [name], MD;  Location: [facility];  Service: Gynecology;  Laterality: N/A;  ? EYE SURGERY  2015  ? cataracts  ? JOINT REPLACEMENT  2014  ? Knee replacement  ? KNEE ARTHROSCOPY W/ MENISCECTOMY Right 2011  ? ROTATOR CUFF REPAIR Right 2005  ? skin surgery    ? TONSILLECTOMY  AS CHILD  ? TOTAL KNEE ARTHROPLASTY Right 05/03/2015  ? Procedure: RIGHT TOTAL KNEE ARTHROPLASTY;  Surgeon: Vickey Huger, MD;  Location: [facility];  Service: Orthopedics;  Laterality: Right;  ? ?Family History  ?Problem Relation Age of Onset  ? Arthritis Mother   ? Lung cancer Father   ? Cancer Father   ? Diabetes Brother   ?     age 73  ? Birth defects Brother   ? Arthritis Maternal Grandmother   ? Heart disease Maternal Grandmother   ? Heart attack Paternal Grandmother   ? Colon cancer Neg Hx   ? Stomach cancer Neg Hx   ? ?Social History  ? ?Socioeconomic History  ? Marital status: Married  ?  Spouse name: Haydn Cush  ? Number of children: 2  ? Years of education: 3  ? Highest education level: Associate degree: academic program  ?Occupational History  ?  Employer: [employer]  ? Occupation: Retired   ?Tobacco Use  ? Smoking status: Never  ? Smokeless tobacco: Never  ? Tobacco comments:  ?  None  ?Vaping Use  ? Vaping Use: Never used  ?Substance and Sexual Activity  ? Alcohol use: Yes  ?  Alcohol/week: 4.0 standard drinks  ?  Types: 4 Glasses of wine per week  ?  Comment: 4 glasses of wine a week  ? Drug use: No  ? Sexual activity: Not Currently  ?  Partners: Male  ?Other Topics Concern  ? Not on file  ?Social History Narrative  ? Lives with her husband. She has two daughters. She enjoys dancing and going out to different restaurants.  ? ?Social Determinants of Health  ? ?Financial Resource Strain: Low Risk   ? Difficulty  of Paying Living Expenses: Not hard at all  ?Food Insecurity: No Food Insecurity  ? Worried About Charity fundraiser in the Last Year: Never true  ? Ran Out of Food in the Last Year: Never true  ?Transportation Needs: No Transportation Needs  ? Lack of Transportation (Medical): No  ? Lack of Transportation (Non-Medical): No  ?Physical Activity: Sufficiently Active  ? Days of Exercise per Week: 3 days  ? Minutes of Exercise per Session: 60 min  ?Stress: No Stress Concern Present  ? Feeling of Stress : Only a little  ?Social Connections: Socially Integrated  ? Frequency of Communication with Friends and Family: More than three times a week  ? Frequency of Social Gatherings with Friends and Family: Three times a week  ? Attends Religious Services: More than 4 times per year  ? Active Member of Clubs or Organizations: Yes  ? Attends Archivist Meetings: Patient refused  ? Marital Status: Married  ? ? ?Activities of Daily Living ? ?  06/24/2021  ?  2:47 PM 06/20/2021  ?  9:05 AM  ?In your present state of health, do you have any difficulty performing the following activities:  ?Hearing? 0 0  ?Vision? 1 1  ?Difficulty concentrating or making decisions? 0 0  ?Walking or climbing stairs? 0 0  ?Dressing or bathing? 0 0  ?Doing errands, shopping? 0 0  ?Preparing Food and eating ? N N  ?Using the  Toilet? N N  ?In the past six months, have you accidently leaked urine? N N  ?Do you have problems with loss of bowel control? N N  ?Managing your Medications? N N  ?Managing your Finances? N N  ?Housekeeping or [employer]

## 2021-06-24 NOTE — Patient Instructions (Addendum)
?MEDICARE ANNUAL WELLNESS VISIT ?Health Maintenance Summary and Written Plan of Care ? ?Ms. Anctil , ? ?Thank you for allowing me to perform your Medicare Annual Wellness Visit and for your ongoing commitment to your health.  ? ?Health Maintenance & Immunization History ?Health Maintenance  ?Topic Date Due  ?? COVID-19 Vaccine (4 - Booster for Pfizer series) 07/10/2021 (Originally 09/28/2020)  ?? TETANUS/TDAP  06/25/2022 (Originally 11/04/2019)  ?? Pneumonia Vaccine 74+ Years old  Completed  ?? INFLUENZA VACCINE  Completed  ?? DEXA SCAN  Completed  ?? Hepatitis C Screening  Completed  ?? Zoster Vaccines- Shingrix  Completed  ?? HPV VACCINES  Aged Out  ?? COLONOSCOPY (Pts 45-69yr Insurance coverage will need to be confirmed)  Discontinued  ? ?Immunization History  ?Administered Date(s) Administered  ?? Influenza, High Dose Seasonal PF 01/14/2015, 01/03/2018, 01/07/2019  ?? Influenza-Unspecified 02/19/2020, 01/20/2021  ?? Moderna SARS-COV2 Booster Vaccination 08/03/2020  ?? PFIZER Comirnaty(Gray Top)Covid-19 Tri-Sucrose Vaccine 05/05/2019, 05/27/2019  ?? PFIZER(Purple Top)SARS-COV-2 Vaccination 02/08/2020  ?? Pneumococcal Conjugate-13 10/11/2017  ?? Pneumococcal Polysaccharide-23 02/25/2019  ?? Tdap 11/03/2009  ?? Zoster Recombinat (Shingrix) 07/30/2019, 11/17/2019  ?? Zoster, Live 12/05/2019  ? ? ?These are the patient goals that we discussed: ? Goals Addressed   ?  ?  ?  ?  ?  ? This Visit's Progress  ? ?  Patient Stated (pt-stated)     ?   06/24/2021 ?AWV Goal: Exercise for General Health ? ?Patient will verbalize understanding of the benefits of increased physical activity: ?Exercising regularly is important. It will improve your overall fitness, flexibility, and endurance. ?Regular exercise also will improve your overall health. It can help you control your weight, reduce stress, and improve your bone density. ?Over the next year, patient will increase physical activity as tolerated with a goal of at least 150  minutes of moderate physical activity per week.  ?You can tell that you are exercising at a moderate intensity if your heart starts beating faster and you start breathing faster but can still hold a conversation. ?Moderate-intensity exercise ideas include: ?Walking 1 mile (1.6 km) in about 15 minutes ?Biking ?Hiking ?Golfing ?Dancing ?Water aerobics ?Patient will verbalize understanding of everyday activities that increase physical activity by providing examples like the following: ?Yard work, such as: ?Pushing a lConservation officer, nature?Raking and bagging leaves ?Washing your car ?Pushing a stroller ?Shoveling snow ?Gardening ?Washing windows or floors ?Patient will be able to explain general safety guidelines for exercising:  ?Before you start a new exercise program, talk with your health care provider. ?Do not exercise so much that you hurt yourself, feel dizzy, or get very short of breath. ?Wear comfortable clothes and wear shoes with good support. ?Drink plenty of water while you exercise to prevent dehydration or heat stroke. ?Work out until your breathing and your heartbeat get faster.  ?  ?  ?  ? ?This is a list of Health Maintenance Items that are overdue or due now: ?Td vaccine ? ?Orders/Referrals Placed Today: ?No orders of the defined types were placed in this encounter. ? ?(Contact our referral department at [phone] you have not spoken with someone about your referral appointment within the next 5 days)  ? ? ?Follow-up Plan ?Follow-up with MLuetta Nutting DO as planned ?Schedule your tetanus shot at the pharmacy.  ?Please have your bone density scan results faxed from Dr. HMatthew Saras ?Medicare wellness in one year.  ?Patient will access AVS on my chart. ? ? ? ?  ?Health Maintenance, Female ?Adopting  a healthy lifestyle and getting preventive care are important in promoting health and wellness. Ask your health care provider about: ?The right schedule for you to have regular tests and exams. ?Things you can do on  your own to prevent diseases and keep yourself healthy. ?What should I know about diet, weight, and exercise? ?Eat a healthy diet ? ?Eat a diet that includes plenty of vegetables, fruits, low-fat dairy products, and lean protein. ?Do not eat a lot of foods that are high in solid fats, added sugars, or sodium. ?Maintain a healthy weight ?Body mass index (BMI) is used to identify weight problems. It estimates body fat based on height and weight. Your health care provider can help determine your BMI and help you achieve or maintain a healthy weight. ?Get regular exercise ?Get regular exercise. This is one of the most important things you can do for your health. Most adults should: ?Exercise for at least 150 minutes each week. The exercise should increase your heart rate and make you sweat (moderate-intensity exercise). ?Do strengthening exercises at least twice a week. This is in addition to the moderate-intensity exercise. ?Spend less time sitting. Even light physical activity can be beneficial. ?Watch cholesterol and blood lipids ?Have your blood tested for lipids and cholesterol at 78 years of age, then have this test every 5 years. ?Have your cholesterol levels checked more often if: ?Your lipid or cholesterol levels are high. ?You are older than 78 years of age. ?You are at high risk for heart disease. ?What should I know about cancer screening? ?Depending on your health history and family history, you may need to have cancer screening at various ages. This may include screening for: ?Breast cancer. ?Cervical cancer. ?Colorectal cancer. ?Skin cancer. ?Lung cancer. ?What should I know about heart disease, diabetes, and high blood pressure? ?Blood pressure and heart disease ?High blood pressure causes heart disease and increases the risk of stroke. This is more likely to develop in people who have high blood pressure readings or are overweight. ?Have your blood pressure checked: ?Every 3-5 years if you are 49-21  years of age. ?Every year if you are 9 years old or older. ?Diabetes ?Have regular diabetes screenings. This checks your fasting blood sugar level. Have the screening done: ?Once every three years after age 60 if you are at a normal weight and have a low risk for diabetes. ?More often and at a younger age if you are overweight or have a high risk for diabetes. ?What should I know about preventing infection? ?Hepatitis B ?If you have a higher risk for hepatitis B, you should be screened for this virus. Talk with your health care provider to find out if you are at risk for hepatitis B infection. ?Hepatitis C ?Testing is recommended for: ?Everyone born from [birth year] through 1967. ?Anyone with known risk factors for hepatitis C. ?Sexually transmitted infections (STIs) ?Get screened for STIs, including gonorrhea and chlamydia, if: ?You are sexually active and are younger than 78 years of age. ?You are older than 78 years of age and your health care provider tells you that you are at risk for this type of infection. ?Your sexual activity has changed since you were last screened, and you are at increased risk for chlamydia or gonorrhea. Ask your health care provider if you are at risk. ?Ask your health care provider about whether you are at high risk for HIV. Your health care provider may recommend a prescription medicine to help prevent HIV infection. If you  choose to take medicine to prevent HIV, you should first get tested for HIV. You should then be tested every 3 months for as long as you are taking the medicine. ?Pregnancy ?If you are about to stop having your period (premenopausal) and you may become pregnant, seek counseling before you get pregnant. ?Take 400 to 800 micrograms (mcg) of folic acid every day if you become pregnant. ?Ask for birth control (contraception) if you want to prevent pregnancy. ?Osteoporosis and menopause ?Osteoporosis is a disease in which the bones lose minerals and strength with aging. This  can result in bone fractures. If you are 48 years old or older, or if you are at risk for osteoporosis and fractures, ask your health care provider if you should: ?Be screened for bone loss. ?Take a calcium

## 2021-08-11 ENCOUNTER — Telehealth: Payer: Self-pay

## 2021-08-11 NOTE — Telephone Encounter (Signed)
Requesting lab orders be placed for testing 1 week prior to appt.  ? ?She would like to know what the physical appointment entails because she's having some issues.  ? ?Please advise.  ?

## 2021-08-12 NOTE — Telephone Encounter (Signed)
Medicare does not cover a traditional physical with exam, labs, etc.  They will cover an annual wellness visit where we review medications, fall risk, screenings, immunizations and advanced directives.  This is usually done with [facility] and she had this completed in March.  If she wants more of a traditional physical with exam/labs this would be out of pocket with medicare.  ? ?CM

## 2021-08-12 NOTE — Telephone Encounter (Signed)
Spoke to Mrs. Berres. She would like to change the appt from a physical to regular office visit. Requesting to discuss HTN and Anxiety. She was offered a sooner date change but declined.  ?

## 2021-08-16 ENCOUNTER — Other Ambulatory Visit: Payer: Self-pay | Admitting: Family Medicine

## 2021-08-16 DIAGNOSIS — I10 Essential (primary) hypertension: Secondary | ICD-10-CM

## 2021-08-29 NOTE — Progress Notes (Unsigned)
Advanced Hypertension Clinic Initial Assessment:    Date:  08/30/2021   ID:  Buffi, Ewton 1945/06/30, MRN [id number]  PCP:  Luetta Nutting, DO  Cardiologist:  None  Nephrologist:  Referring MD: Luetta Nutting, DO   CC: Hypertension  History of Present Illness:    Tamara Pope is a 76 y.o. female with a hx of hypertension, anxiety, arthritis, GERD, DVT (04/2020 in the setting of breast implant), spasmodic dysphonia  here to establish care in the Advanced Hypertension Clinic. Her maternal grandmother had heart attack and CAD.  At clinic visit November 2022 with PCP she was started on propanolol as anxiety was thought to be contributed to her elevated blood pressure readings.  Her BP at home was as high as 160/90.  With deep breathing and calming exercises of improved 140/80 at home.  It was not associated with any symptoms.  However she felt jittery and discontinue.  At follow-up with PCP 04/11/2021 BP in clinic 149/82 though noted to be 120s-130s over 60s-70s at home.  She is retired and lives with her spouse. She has 2 children.   She was diagnosed with hypertension three years ago. It has been labile based on her stress. She monitors BP at home with arm cuff in her left arm and readings have been labile. She has never smoked. Alcohol use with one glass of red wine four times week. For exercise she works out twice per week with a Physiological scientist as well as a stretch class weekly. Also enjoys walking at her [place] home in [place]. She eats out mostly but does not add salt to her foods.  She notes occasional chest tightness associated with anxiety. She does not note any chest discomfort or dyspnea with her regular exercise.  Does note over the past 3 months she has been waking in the middle of the night with a flushed sensation and tachycardia.  Happening twice per month. Tells me she is caffeine sensitive -drinks only one cup of decaf coffee in the morning.    Previous  antihypertensives: Propranolol - felt jittery  Secondary Causes of Hypertension  Medications/Herbal: Took Advil recently due to dental procedure but OCP, steroids, stimulants, antidepressants, weight loss medication, immune suppressants, NSAIDs, sympathomimetics, alcohol, caffeine, licorice, ginseng, St. John's wort, chemo ***Taking Advil after recent dental surgery  Sleep Apnea - No prior sleep study. No snores.  Renal artery stenosis Hyperaldosteronism Hyper/hypothyroidism - 02/2019 TSH 1.86 Pheochromocytoma: palpitations, tachycardia, headache, diaphoresis (plasma metanephrines) Cushing's syndrome: Cushingoid facies, central obesity, proximal muscle weakness, and ecchymoses, adrenal incidentaloma (cortisol) Coarctation of the aorta - BP in both arms ***  Past Medical History:  Diagnosis Date   Allergy    Anxiety 1990   Arthritis    Dysphonia, spasmodic    GERD (gastroesophageal reflux disease)    History of DVT (deep vein thrombosis)    JAN 2002-- S/P BREAST IMPLANTS  RIGHT UPPER ARM (AXILLARY/ SUBCLAVIN VEIN)   PMB (postmenopausal bleeding)     Past Surgical History:  Procedure Laterality Date   APPENDECTOMY  AS  CHILD   AUGMENTATION MAMMAPLASTY Bilateral    20 years ago   BILATERAL BREAST LIFT AND IMPLANT REDUCTION  12/2012   BREAST ENHANCEMENT SURGERY Bilateral 02/2000   CATARACT EXTRACTION W/ INTRAOCULAR LENS  IMPLANT, BILATERAL     COSMETIC SURGERY  1995   breast implants   DILATATION & CURRETTAGE/HYSTEROSCOPY WITH RESECTOCOPE N/A 04/18/2013   Procedure: DILATATION & CURETTAGE/HYSTEROSCOPY ;  Surgeon: [name], MD;  Location: [facility];  Service: Gynecology;  Laterality: N/A;   EYE SURGERY  2015   cataracts   JOINT REPLACEMENT  2014   Knee replacement   KNEE ARTHROSCOPY W/ MENISCECTOMY Right 2011   ROTATOR CUFF REPAIR Right 2005   skin surgery     TONSILLECTOMY  AS CHILD   TOTAL KNEE ARTHROPLASTY Right 05/03/2015   Procedure: RIGHT  TOTAL KNEE ARTHROPLASTY;  Surgeon: Vickey Huger, MD;  Location: [facility];  Service: Orthopedics;  Laterality: Right;    Current Medications: Current Meds  Medication Sig   ALPRAZolam (XANAX) 0.5 MG tablet Take 0.5 mg by mouth 2 (two) times daily as needed.   Biotin 5000 MCG TABS Take 1 tablet by mouth daily.   Calcium Carb-Cholecalciferol 600-800 MG-UNIT TABS Take 1 tablet by mouth 2 (two) times daily.   Cholecalciferol (VITAMIN D3) 2000 units TABS Take 1 tablet by mouth daily.   clonazePAM (KLONOPIN) 0.5 MG tablet Take 1 tablet (0.5 mg total) by mouth daily.   Cranberry 400 MG CAPS cranberry   cycloSPORINE (RESTASIS) 0.05 % ophthalmic emulsion 1 drop 2 (two) times daily.   diazepam (VALIUM) 5 MG tablet Take 1 tablet (5 mg total) by mouth every 8 (eight) hours as needed for anxiety. Take 30 min prior to travel   estradiol (ESTRACE) 0.1 MG/GM vaginal cream Place vaginally as needed.   fluticasone (FLONASE) 50 MCG/ACT nasal spray Place 1 spray into both nostrils daily as needed for allergies or rhinitis.   Probiotic Product (ALIGN) 4 MG CAPS Take 1 capsule by mouth daily.   valsartan (DIOVAN) 40 MG tablet Take 1 tablet (40 mg total) by mouth daily.     Allergies:   Bee venom and Wasp venom   Social History   Socioeconomic History   Marital status: Married    Spouse name: Modesty Rudy   Number of children: 2   Years of education: 14   Highest education level: Associate degree: academic program  Occupational History    Employer: [employer].   Occupation: Retired  Tobacco Use   Smoking status: Never   Smokeless tobacco: Never   Tobacco comments:    None  Vaping Use   Vaping Use: Never used  Substance and Sexual Activity   Alcohol use: Yes    Alcohol/week: 4.0 standard drinks    Types: 4 Glasses of wine per week    Comment: 4 glasses of wine a week   Drug use: No   Sexual activity: Not Currently    Partners: Male  Other Topics Concern   Not on file  Social History  Narrative   Lives with her husband. She has two daughters. She enjoys dancing and going out to different restaurants.   Social Determinants of Health   Financial Resource Strain: Low Risk    Difficulty of Paying Living Expenses: Not hard at all  Food Insecurity: No Food Insecurity   Worried About Charity fundraiser in the Last Year: Never true   [place] in the Last Year: Never true  Transportation Needs: No Transportation Needs   Lack of Transportation (Medical): No   Lack of Transportation (Non-Medical): No  Physical Activity: Sufficiently Active   Days of Exercise per Week: 3 days   Minutes of Exercise per Session: 60 min  Stress: No Stress Concern Present   Feeling of Stress : Only a little  Social Connections: Engineer, building services of Communication with Friends and Family: More than  three times a week   Frequency of Social Gatherings with Friends and Family: Three times a week   Attends Religious Services: More than 4 times per year   Active Member of Clubs or Organizations: Yes   Attends Archivist Meetings: Patient refused   Marital Status: Married     Family History: The patient's ***family history includes Arthritis in her maternal grandmother and mother; Birth defects in her brother; Cancer in her father; Diabetes in her brother; Heart attack in her paternal grandmother; Heart disease in her maternal grandmother; Lung cancer in her father. There is no history of Colon cancer or Stomach cancer.  ROS:   Please see the history of present illness.    *** All other systems reviewed and are negative.  EKGs/Labs/Other Studies Reviewed:    EKG:  EKG is  ordered today.  The ekg ordered today demonstrates NSR 96 bpm with no acute St/T wave changes.   Recent Labs: No results found for requested labs within last 8760 hours.   Recent Lipid Panel    Component Value Date/Time   CHOL 267 (H) 05/07/2020 1444   TRIG 117 05/07/2020 1444   HDL 95  05/07/2020 1444   CHOLHDL 2.8 05/07/2020 1444   VLDL 28.8 02/18/2019 1116   LDLCALC 149 (H) 05/07/2020 1444    Physical Exam:   VS:  BP (!) 144/81 (BP Location: Left Arm, Patient Position: Sitting, Cuff Size: Normal)   Pulse 96   Ht '5\' 3"'$  (1.6 m)   Wt 129 lb 14.4 oz (58.9 kg)   BMI 23.01 kg/m  , BMI Body mass index is 23.01 kg/m. GENERAL:  Well appearing HEENT: Pupils equal round and reactive, fundi not visualized, oral mucosa unremarkable NECK:  No jugular venous distention, waveform within normal limits, carotid upstroke brisk and symmetric, no bruits, no thyromegaly LYMPHATICS:  No cervical adenopathy LUNGS:  Clear to auscultation bilaterally HEART:  RRR.  PMI not displaced or sustained,S1 and S2 within normal limits, no S3, no S4, no clicks, no rubs,  murmurs ABD:  Flat, positive bowel sounds normal in frequency in pitch, no bruits, no rebound, no guarding, no midline pulsatile mass, no hepatomegaly, no splenomegaly EXT:  2 plus pulses throughout, no edema, no cyanosis no clubbing SKIN:  No rashes no nodules NEURO:  Cranial nerves II through XII grossly intact, motor grossly intact throughout PSYCH:  Cognitively intact, oriented to person place and time   ASSESSMENT/PLAN:    No problem-specific Assessment & Plan notes found for this encounter.   Screening for Secondary Hypertension: { Click here to document screening for secondary causes of HTN  :[id number]}    08/30/2021    5:06 PM  Causes  Drugs/Herbals Screened  Sleep Apnea Screened     - Comments No snoring.  Thyroid Disease Screened     - Comments 02/2019 TSH 1.86. Update TSH 08/30/21.  Pheochromocytoma Screened     - Comments Notes palpitations, diaphoresis. Consider urine metanephrines at follow up.  Cushing's Syndrome Screened     - Comments No cushinoid appearance.  Coarctation of the Aorta Screened     - Comments R arm BP 163/84 and L arm 144/81.  Compliance Screened    Relevant Labs/Studies:    Latest  Ref Rng & Units 05/07/2020    2:44 PM 02/18/2019   11:16 AM 12/18/2017    8:37 AM  Basic Labs  Sodium 135 - 146 mmol/L 132   140   135    Potassium 3.5 -  5.3 mmol/L 4.2   4.3   4.4    Creatinine 0.60 - 0.93 mg/dL 0.48   0.52   0.59         Latest Ref Rng & Units 02/18/2019   11:16 AM  Thyroid   TSH 0.35 - 4.50 uIU/mL 1.86                   she consents to be monitored in our remote patient monitoring program through [facility].  she will track his blood pressure twice daily and understands that these trends will help [place] to adjust her medications as needed prior to his next appointment.  she *** notinterested in enrolling in the PREP exercise and nutrition program through the [facility].    Disposition:    FU with MD/PharmD in 1 month    Medication Adjustments/Labs and Tests Ordered: Current medicines are reviewed at length with the patient today.  Concerns regarding medicines are outlined above.  Orders Placed This Encounter  Procedures   Basic metabolic panel   TSH   Basic metabolic panel   Cantril's Ladder Assessment   LONG TERM MONITOR (3-14 DAYS)   EKG 12-Lead   Meds ordered this encounter  Medications   valsartan (DIOVAN) 40 MG tablet    Sig: Take 1 tablet (40 mg total) by mouth daily.    Dispense:  90 tablet    Refill:  3    Signed, Loel Dubonnet, NP  08/30/2021 5:09 PM    [facility]

## 2021-08-30 ENCOUNTER — Encounter (HOSPITAL_BASED_OUTPATIENT_CLINIC_OR_DEPARTMENT_OTHER): Payer: Self-pay | Admitting: Family

## 2021-08-30 ENCOUNTER — Ambulatory Visit (INDEPENDENT_AMBULATORY_CARE_PROVIDER_SITE_OTHER): Payer: Medicare Other

## 2021-08-30 ENCOUNTER — Ambulatory Visit (INDEPENDENT_AMBULATORY_CARE_PROVIDER_SITE_OTHER): Payer: Medicare Other | Admitting: Family

## 2021-08-30 VITALS — BP 144/81 | HR 96 | Ht 63.0 in | Wt 129.9 lb

## 2021-08-30 DIAGNOSIS — R002 Palpitations: Secondary | ICD-10-CM

## 2021-08-30 DIAGNOSIS — J383 Other diseases of vocal cords: Secondary | ICD-10-CM

## 2021-08-30 DIAGNOSIS — I10 Essential (primary) hypertension: Secondary | ICD-10-CM | POA: Diagnosis not present

## 2021-08-30 MED ORDER — VALSARTAN 40 MG PO TABS: 40.0000 mg | ORAL_TABLET | Freq: Every day | ORAL | 3 refills | Status: DC

## 2021-08-30 NOTE — Patient Instructions (Signed)
Medication Instructions:  Your physician has recommended you make the following change in your medication:  Start: Valsartan '40mg'$  daily    Labwork: Your physician recommends that you return for lab work today- BMP, TSH   Please return for Lab work in 2 weeks for BMP. You may come to the...   [facility] (3rd floor) [place], [place] [address]  Open: 8am-Noon and 1pm-4:30pm   [place] [facility]- Any location  **no appointments needed**    Testing/Procedures: Your physician has recommended that you wear a Zio monitor.   This monitor is a medical device that records the heart's electrical activity. Doctors most often use these monitors to diagnose arrhythmias. Arrhythmias are problems with the speed or rhythm of the heartbeat. The monitor is a small device applied to your chest. You can wear one while you do your normal daily activities. While wearing this monitor if you have any symptoms to push the button and record what you felt. Once you have worn this monitor for the period of time provider prescribed (Usually 14 days), you will return the monitor device in the postage paid box. Once it is returned they will download the data collected and provide [place] with a report which the provider will then review and we will call you with those results. Important tips:  Avoid showering during the first 24 hours of wearing the monitor. Avoid excessive sweating to help maximize wear time. Do not submerge the device, no hot tubs, and no swimming pools. Keep any lotions or oils away from the patch. After 24 hours you may shower with the patch on. Take brief showers with your back facing the shower head.  Do not remove patch once it has been placed because that will interrupt data and decrease adhesive wear time. Push the button when you have any symptoms and write down what you were feeling. Once you have  completed wearing your monitor, remove and place into box which has postage paid and place in your outgoing mailbox.  If for some reason you have misplaced your box then call our office and we can provide another box and/or mail it off for you.   Follow-Up: 1 month with either Dr. Oval Linsey, Laurann Montana, NP or PharmD   Referrals:  None today   Special Instructions:  DASH Eating Plan DASH stands for Dietary Approaches to Stop Hypertension. The DASH eating plan is a healthy eating plan that has been shown to: Reduce high blood pressure (hypertension). Reduce your risk for type 2 diabetes, heart disease, and stroke. Help with weight loss. What are tips for following this plan? Reading food labels Check food labels for the amount of salt (sodium) per serving. Choose foods with less than 5 percent of the Daily Value of sodium. Generally, foods with less than 300 milligrams (mg) of sodium per serving fit into this eating plan. To find whole grains, look for the word "whole" as the first word in the ingredient list. Shopping Buy products labeled as "low-sodium" or "no salt added." Buy fresh foods. Avoid canned foods and pre-made or frozen meals. Cooking Avoid adding salt when cooking. Use salt-free seasonings or herbs instead of table salt or sea salt. Check with your health care provider or pharmacist before using salt substitutes. Do not fry foods. Cook foods using healthy methods such as baking, boiling, grilling, roasting, and broiling instead. Cook with heart-healthy oils, such as olive, canola, avocado,  soybean, or sunflower oil. Meal planning  Eat a balanced diet that includes: 4 or more servings of fruits and 4 or more servings of vegetables each day. Try to fill one-half of your plate with fruits and vegetables. 6-8 servings of whole grains each day. Less than 6 oz (170 g) of lean meat, poultry, or fish each day. A 3-oz (85-g) serving of meat is about the same size as a deck of  cards. One egg equals 1 oz (28 g). 2-3 servings of low-fat dairy each day. One serving is 1 cup (237 mL). 1 serving of nuts, seeds, or beans 5 times each week. 2-3 servings of heart-healthy fats. Healthy fats called omega-3 fatty acids are found in foods such as walnuts, flaxseeds, fortified milks, and eggs. These fats are also found in cold-water fish, such as sardines, salmon, and mackerel. Limit how much you eat of: Canned or prepackaged foods. Food that is high in trans fat, such as some fried foods. Food that is high in saturated fat, such as fatty meat. Desserts and other sweets, sugary drinks, and other foods with added sugar. Full-fat dairy products. Do not salt foods before eating. Do not eat more than 4 egg yolks a week. Try to eat at least 2 vegetarian meals a week. Eat more home-cooked food and less restaurant, buffet, and fast food. Lifestyle When eating at a restaurant, ask that your food be prepared with less salt or no salt, if possible. If you drink alcohol: Limit how much you use to: 0-1 drink a day for women who are not pregnant. 0-2 drinks a day for men. Be aware of how much alcohol is in your drink. In the [place] one drink equals one 12 oz bottle of beer (355 mL), one 5 oz glass of wine (148 mL), or one 1 oz glass of hard liquor (44 mL). General information Avoid eating more than 2,300 mg of salt a day. If you have hypertension, you may need to reduce your sodium intake to 1,500 mg a day. Work with your health care provider to maintain a healthy body weight or to lose weight. Ask what an ideal weight is for you. Get at least 30 minutes of exercise that causes your heart to beat faster (aerobic exercise) most days of the week. Activities may include walking, swimming, or biking. Work with your health care provider or dietitian to adjust your eating plan to your individual calorie needs. What foods should I eat? Fruits All fresh, dried, or frozen fruit. Canned fruit in  natural juice (without added sugar). Vegetables Fresh or frozen vegetables (raw, steamed, roasted, or grilled). Low-sodium or reduced-sodium tomato and vegetable juice. Low-sodium or reduced-sodium tomato sauce and tomato paste. Low-sodium or reduced-sodium canned vegetables. Grains Whole-grain or whole-wheat bread. Whole-grain or whole-wheat pasta. Brown rice. [place]. Bulgur. Whole-grain and low-sodium cereals. Pita bread. Low-fat, low-sodium crackers. Whole-wheat flour tortillas. Meats and other proteins Skinless chicken or [place]. Ground chicken or [place]. Pork with fat trimmed off. Fish and seafood. Egg whites. Dried beans, peas, or lentils. Unsalted nuts, nut butters, and seeds. Unsalted canned beans. Lean cuts of beef with fat trimmed off. Low-sodium, lean precooked or cured meat, such as sausages or meat loaves. Dairy Low-fat (1%) or fat-free (skim) milk. Reduced-fat, low-fat, or fat-free cheeses. Nonfat, low-sodium ricotta or cottage cheese. Low-fat or nonfat yogurt. Low-fat, low-sodium cheese. Fats and oils Soft margarine without trans fats. Vegetable oil. Reduced-fat, low-fat, or light mayonnaise and salad dressings (reduced-sodium). Canola, safflower, olive, avocado, soybean,  and sunflower oils. Avocado. Seasonings and condiments Herbs. Spices. Seasoning mixes without salt. Other foods Unsalted popcorn and pretzels. Fat-free sweets. The items listed above may not be a complete list of foods and beverages you can eat. Contact a dietitian for more information. What foods should I avoid? Fruits Canned fruit in a light or heavy syrup. Fried fruit. Fruit in cream or butter sauce. Vegetables Creamed or fried vegetables. Vegetables in a cheese sauce. Regular canned vegetables (not low-sodium or reduced-sodium). Regular canned tomato sauce and paste (not low-sodium or reduced-sodium). Regular tomato and vegetable juice (not low-sodium or reduced-sodium). Angie Fava.  Olives. Grains Baked goods made with fat, such as croissants, muffins, or some breads. Dry pasta or rice meal packs. Meats and other proteins Fatty cuts of meat. Ribs. Fried meat. Berniece Salines. Bologna, salami, and other precooked or cured meats, such as sausages or meat loaves. Fat from the back of a pig (fatback). Bratwurst. Salted nuts and seeds. Canned beans with added salt. Canned or smoked fish. Whole eggs or egg yolks. Chicken or [place] with skin. Dairy Whole or 2% milk, cream, and half-and-half. Whole or full-fat cream cheese. Whole-fat or sweetened yogurt. Full-fat cheese. Nondairy creamers. Whipped toppings. Processed cheese and cheese spreads. Fats and oils Butter. Stick margarine. Lard. Shortening. Ghee. Bacon fat. Tropical oils, such as coconut, palm kernel, or palm oil. Seasonings and condiments Onion salt, garlic salt, seasoned salt, table salt, and sea salt. Worcestershire sauce. Tartar sauce. Barbecue sauce. Teriyaki sauce. Soy sauce, including reduced-sodium. Steak sauce. Canned and packaged gravies. Fish sauce. Oyster sauce. Cocktail sauce. Store-bought horseradish. Ketchup. Mustard. Meat flavorings and tenderizers. Bouillon cubes. Hot sauces. Pre-made or packaged marinades. Pre-made or packaged taco seasonings. Relishes. Regular salad dressings. Other foods Salted popcorn and pretzels. The items listed above may not be a complete list of foods and beverages you should avoid. Contact a dietitian for more information. Where to find more information National Heart, Lung, and Blood Institute: https://Vaun Hyndman-eaton.com/ American Heart Association: www.heart.org Academy of Nutrition and Dietetics: www.eatright.[place]: www.kidney.org Summary The DASH eating plan is a healthy eating plan that has been shown to reduce high blood pressure (hypertension). It may also reduce your risk for type 2 diabetes, heart disease, and stroke. When on the DASH eating plan, aim to eat more  fresh fruits and vegetables, whole grains, lean proteins, low-fat dairy, and heart-healthy fats. With the DASH eating plan, you should limit salt (sodium) intake to 2,300 mg a day. If you have hypertension, you may need to reduce your sodium intake to 1,500 mg a day. Work with your health care provider or dietitian to adjust your eating plan to your individual calorie needs. This information is not intended to replace advice given to you by your health care provider. Make sure you discuss any questions you have with your health care provider. Document Revised: 02/21/2019 Document Reviewed: 02/21/2019 Elsevier Patient Education  [place].

## 2021-08-31 DIAGNOSIS — L820 Inflamed seborrheic keratosis: Secondary | ICD-10-CM | POA: Diagnosis not present

## 2021-08-31 DIAGNOSIS — D1801 Hemangioma of skin and subcutaneous tissue: Secondary | ICD-10-CM | POA: Diagnosis not present

## 2021-08-31 DIAGNOSIS — L814 Other melanin hyperpigmentation: Secondary | ICD-10-CM | POA: Diagnosis not present

## 2021-08-31 DIAGNOSIS — D225 Melanocytic nevi of trunk: Secondary | ICD-10-CM | POA: Diagnosis not present

## 2021-08-31 DIAGNOSIS — L821 Other seborrheic keratosis: Secondary | ICD-10-CM | POA: Diagnosis not present

## 2021-08-31 DIAGNOSIS — L7211 Pilar cyst: Secondary | ICD-10-CM | POA: Diagnosis not present

## 2021-08-31 DIAGNOSIS — L570 Actinic keratosis: Secondary | ICD-10-CM | POA: Diagnosis not present

## 2021-08-31 DIAGNOSIS — Z85828 Personal history of other malignant neoplasm of skin: Secondary | ICD-10-CM | POA: Diagnosis not present

## 2021-08-31 LAB — BASIC METABOLIC PANEL
BUN/Creatinine Ratio: 31 — ABNORMAL HIGH (ref 12–28)
BUN: 15 mg/dL (ref 8–27)
CO2: 24 mmol/L (ref 20–29)
Calcium: 10.5 mg/dL — ABNORMAL HIGH (ref 8.7–10.3)
Chloride: 95 mmol/L — ABNORMAL LOW (ref 96–106)
Creatinine, Ser: 0.49 mg/dL — ABNORMAL LOW (ref 0.57–1.00)
Glucose: 94 mg/dL (ref 70–99)
Potassium: 5.1 mmol/L (ref 3.5–5.2)
Sodium: 134 mmol/L (ref 134–144)
eGFR: 97 mL/min/{1.73_m2} (ref >59)

## 2021-08-31 LAB — TSH: TSH: 2.31 u[IU]/mL (ref 0.450–4.500)

## 2021-09-01 ENCOUNTER — Encounter (HOSPITAL_BASED_OUTPATIENT_CLINIC_OR_DEPARTMENT_OTHER): Payer: Self-pay | Admitting: Family

## 2021-09-01 DIAGNOSIS — R002 Palpitations: Secondary | ICD-10-CM | POA: Insufficient documentation

## 2021-09-05 ENCOUNTER — Telehealth: Payer: Self-pay

## 2021-09-05 DIAGNOSIS — M8580 Other specified disorders of bone density and structure, unspecified site: Secondary | ICD-10-CM

## 2021-09-05 DIAGNOSIS — E785 Hyperlipidemia, unspecified: Secondary | ICD-10-CM

## 2021-09-05 DIAGNOSIS — E559 Vitamin D deficiency, unspecified: Secondary | ICD-10-CM

## 2021-09-05 DIAGNOSIS — I10 Essential (primary) hypertension: Secondary | ICD-10-CM

## 2021-09-05 DIAGNOSIS — R739 Hyperglycemia, unspecified: Secondary | ICD-10-CM

## 2021-09-05 NOTE — Telephone Encounter (Signed)
Pt lvm requesting hormone labs be added to her physical labs prior to 10/13/21 appt.

## 2021-09-08 NOTE — Addendum Note (Signed)
Addended by: Perlie Mayo on: 09/08/2021 10:31 PM   Modules accepted: Orders

## 2021-09-09 NOTE — Telephone Encounter (Signed)
Please contact Ms. Leas and schedule her lab appointment. Thanks

## 2021-09-12 ENCOUNTER — Encounter (HOSPITAL_BASED_OUTPATIENT_CLINIC_OR_DEPARTMENT_OTHER): Payer: Self-pay

## 2021-09-12 NOTE — Telephone Encounter (Signed)
Called patient at 11:40 and left a vm to call back to schedule lab appointment.

## 2021-09-15 DIAGNOSIS — R002 Palpitations: Secondary | ICD-10-CM | POA: Diagnosis not present

## 2021-09-16 ENCOUNTER — Ambulatory Visit (HOSPITAL_BASED_OUTPATIENT_CLINIC_OR_DEPARTMENT_OTHER): Payer: Medicare Other | Admitting: Family

## 2021-09-16 ENCOUNTER — Encounter (HOSPITAL_BASED_OUTPATIENT_CLINIC_OR_DEPARTMENT_OTHER): Payer: Self-pay

## 2021-09-19 ENCOUNTER — Ambulatory Visit (INDEPENDENT_AMBULATORY_CARE_PROVIDER_SITE_OTHER): Payer: Medicare Other | Admitting: Family Medicine

## 2021-09-19 ENCOUNTER — Encounter: Payer: Self-pay | Admitting: Family Medicine

## 2021-09-19 VITALS — BP 154/73 | HR 84 | Ht 63.0 in | Wt 121.0 lb

## 2021-09-19 DIAGNOSIS — I10 Essential (primary) hypertension: Secondary | ICD-10-CM

## 2021-09-19 DIAGNOSIS — L089 Local infection of the skin and subcutaneous tissue, unspecified: Secondary | ICD-10-CM | POA: Insufficient documentation

## 2021-09-19 DIAGNOSIS — M8580 Other specified disorders of bone density and structure, unspecified site: Secondary | ICD-10-CM | POA: Diagnosis not present

## 2021-09-19 DIAGNOSIS — F411 Generalized anxiety disorder: Secondary | ICD-10-CM | POA: Diagnosis not present

## 2021-09-19 DIAGNOSIS — T148XXA Other injury of unspecified body region, initial encounter: Secondary | ICD-10-CM

## 2021-09-19 DIAGNOSIS — E559 Vitamin D deficiency, unspecified: Secondary | ICD-10-CM | POA: Diagnosis not present

## 2021-09-19 DIAGNOSIS — R739 Hyperglycemia, unspecified: Secondary | ICD-10-CM | POA: Diagnosis not present

## 2021-09-19 DIAGNOSIS — E785 Hyperlipidemia, unspecified: Secondary | ICD-10-CM | POA: Diagnosis not present

## 2021-09-19 MED ORDER — ESCITALOPRAM OXALATE 10 MG PO TABS: 10.0000 mg | ORAL_TABLET | Freq: Every day | ORAL | 1 refills | Status: DC

## 2021-09-19 MED ORDER — DOXYCYCLINE HYCLATE 100 MG PO TABS: 100.0000 mg | ORAL_TABLET | Freq: Two times a day (BID) | ORAL | 0 refills | Status: DC

## 2021-09-19 NOTE — Assessment & Plan Note (Signed)
Starting course of doxycycline 100 mg twice daily for 10 days.  She will let me know if symptoms are worsening.

## 2021-09-19 NOTE — Assessment & Plan Note (Signed)
She is weaning from alprazolam.  I encouraged her to continue to wean from this and will start Lexapro to help with management of her anxiety.  Initially starting 5 mg daily for the first week with increase to 10 mg thereafter.  I will plan to see her back in about 6 weeks.

## 2021-09-19 NOTE — Progress Notes (Signed)
Tamara Pope - 76 y.o. female MRN [id number]  Date of birth: 07-Nov-1945  Subjective Chief Complaint  Patient presents with   Wound Infection    HPI Tamara Pope is a 76 year old female here today with concern about possible wound infection.  Reports hitting her leg on the boat couple of weeks ago.  Seem to be doing okay but recently has noticed some increased redness and warmth around the wound.  She has not noted any drainage.  She did recently complete a course of amoxicillin for a dental issue seem to improve the wound.  Denies fever or chills.  She has had some problems with fluctuating blood pressure.  Seen in advanced hypertension clinic and started on valsartan.  Blood pressure has been better controlled since adding this.  She has been dealing with increased anxiety.  She is in the process of weaning off of alprazolam.  She has been taking this to help with her spasmodic dysphonia but is concerned about long-term effects of this.  She does like her anxiety is contributing to her blood pressure some.  ROS:  A comprehensive ROS was completed and negative except as noted per HPI    Allergies  Allergen Reactions   Bee Venom Anaphylaxis   Wasp Venom Anaphylaxis    Past Medical History:  Diagnosis Date   Allergy    Anxiety 1990   Arthritis    Dysphonia, spasmodic    GERD (gastroesophageal reflux disease)    History of DVT (deep vein thrombosis)    JAN 2002-- S/P BREAST IMPLANTS  RIGHT UPPER ARM (AXILLARY/ SUBCLAVIN VEIN)   PMB (postmenopausal bleeding)     Past Surgical History:  Procedure Laterality Date   APPENDECTOMY  AS  CHILD   AUGMENTATION MAMMAPLASTY Bilateral    20 years ago   BILATERAL BREAST LIFT AND IMPLANT REDUCTION  12/2012   BREAST ENHANCEMENT SURGERY Bilateral 02/2000   CATARACT EXTRACTION W/ INTRAOCULAR LENS  IMPLANT, BILATERAL     COSMETIC SURGERY  1995   breast implants   DILATATION & CURRETTAGE/HYSTEROSCOPY WITH RESECTOCOPE N/A 04/18/2013    Procedure: DILATATION & CURETTAGE/HYSTEROSCOPY ;  Surgeon: [name], MD;  Location: [facility];  Service: Gynecology;  Laterality: N/A;   EYE SURGERY  2015   cataracts   JOINT REPLACEMENT  2014   Knee replacement   KNEE ARTHROSCOPY W/ MENISCECTOMY Right 2011   ROTATOR CUFF REPAIR Right 2005   skin surgery     TONSILLECTOMY  AS CHILD   TOTAL KNEE ARTHROPLASTY Right 05/03/2015   Procedure: RIGHT TOTAL KNEE ARTHROPLASTY;  Surgeon: Vickey Huger, MD;  Location: [facility];  Service: Orthopedics;  Laterality: Right;    Social History   Socioeconomic History   Marital status: Married    Spouse name: Eular Panek   Number of children: 2   Years of education: 14   Highest education level: Associate degree: academic program  Occupational History    Employer: [employer]   Occupation: Retired  Tobacco Use   Smoking status: Never   Smokeless tobacco: Never   Tobacco comments:    None  Vaping Use   Vaping Use: Never used  Substance and Sexual Activity   Alcohol use: Yes    Alcohol/week: 4.0 standard drinks of alcohol    Types: 4 Glasses of wine per week    Comment: 4 glasses of wine a week   Drug use: No   Sexual activity: Not Currently    Partners: Male  Other Topics  Concern   Not on file  Social History Narrative   Lives with her husband. She has two daughters. She enjoys dancing and going out to different restaurants.   Social Determinants of Health   Financial Resource Strain: Low Risk  (06/24/2021)   Overall Financial Resource Strain (CARDIA)    Difficulty of Paying Living Expenses: Not hard at all  Food Insecurity: No Food Insecurity (06/20/2021)   Hunger Vital Sign    Worried About Running Out of Food in the Last Year: Never true    Ran Out of Food in the Last Year: Never true  Transportation Needs: No Transportation Needs (06/20/2021)   PRAPARE - Hydrologist (Medical): No    Lack of Transportation  (Non-Medical): No  Physical Activity: Sufficiently Active (06/20/2021)   Exercise Vital Sign    Days of Exercise per Week: 3 days    Minutes of Exercise per Session: 60 min  Stress: No Stress Concern Present (06/24/2021)   [place]    Feeling of Stress : Only a little  Social Connections: Socially Integrated (06/24/2021)   Social Connection and Isolation Panel [NHANES]    Frequency of Communication with Friends and Family: More than three times a week    Frequency of Social Gatherings with Friends and Family: Three times a week    Attends Religious Services: More than 4 times per year    Active Member of Clubs or Organizations: Yes    Attends Archivist Meetings: Patient refused    Marital Status: Married    Family History  Problem Relation Age of Onset   Arthritis Mother    Lung cancer Father    Cancer Father    Diabetes Brother        age 63   Birth defects Brother    Arthritis Maternal Grandmother    Heart disease Maternal Grandmother    Heart attack Paternal Grandmother    Colon cancer Neg Hx    Stomach cancer Neg Hx     Health Maintenance  Topic Date Due   COVID-19 Vaccine (4 - Booster for [place] series) 12/20/2021 (Originally 09/28/2020)   TETANUS/TDAP  06/25/2022 (Originally 11/04/2019)   INFLUENZA VACCINE  11/01/2021   Pneumonia Vaccine 81+ Years old  Completed   DEXA SCAN  Completed   Hepatitis C Screening  Completed   Zoster Vaccines- Shingrix  Completed   HPV VACCINES  Aged Out   COLONOSCOPY (Pts 45-47yr Insurance coverage will need to be confirmed)  Discontinued     ----------------------------------------------------------------------------------------------------------------------------------------------------------------------------------------------------------------- Physical Exam BP (!) 154/73 (BP Location: Right Arm, Patient Position: Sitting, Cuff Size: Normal)   Pulse 84    Ht '5\' 3"'$  (1.6 m)   Wt 121 lb (54.9 kg)   SpO2 97%   BMI 21.43 kg/m   Physical Exam Constitutional:      Appearance: Normal appearance.  Eyes:     General: No scleral icterus. Cardiovascular:     Rate and Rhythm: Normal rate and regular rhythm.  Pulmonary:     Effort: Pulmonary effort is normal.     Breath sounds: Normal breath sounds.  Musculoskeletal:     Cervical back: Neck supple.  Skin:    Comments: Scabbed over wound to left lower extremity.  There is surrounding erythema with mild warmth.  No drainage or significant swelling.  Neurological:     Mental Status: She is alert.  Psychiatric:  Mood and Affect: Mood normal.        Behavior: Behavior normal.     ------------------------------------------------------------------------------------------------------------------------------------------------------------------------------------------------------------------- Assessment and Plan  Essential hypertension Recently seen by advanced hypertension clinic.  Currently on valsartan.  Blood pressure is mildly elevated today however think anxiety is a contributor.  GAD (generalized anxiety disorder) She is weaning from alprazolam.  I encouraged her to continue to wean from this and will start Lexapro to help with management of her anxiety.  Initially starting 5 mg daily for the first week with increase to 10 mg thereafter.  I will plan to see her back in about 6 weeks.  Wound infection Starting course of doxycycline 100 mg twice daily for 10 days.  She will let me know if symptoms are worsening.   Meds ordered this encounter  Medications   escitalopram (LEXAPRO) 10 MG tablet    Sig: Take 1 tablet (10 mg total) by mouth daily.    Dispense:  30 tablet    Refill:  1    Take 1/2 tab po daily x7 days and then increase to a full tab   doxycycline (VIBRA-TABS) 100 MG tablet    Sig: Take 1 tablet (100 mg total) by mouth 2 (two) times daily for 10 days.    Dispense:  20  tablet    Refill:  0    Return in about 4 weeks (around 10/17/2021) for Follow up anxiety.    This visit occurred during the SARS-CoV-2 public health emergency.  Safety protocols were in place, including screening questions prior to the visit, additional usage of staff PPE, and extensive cleaning of exam room while observing appropriate contact time as indicated for disinfecting solutions.

## 2021-09-19 NOTE — Assessment & Plan Note (Signed)
Recently seen by advanced hypertension clinic.  Currently on valsartan.  Blood pressure is mildly elevated today however think anxiety is a contributor.

## 2021-09-19 NOTE — Patient Instructions (Addendum)
Nice to see you today! Start doxycycline for the wound on your leg.  Let me know if this isn't continuing to look better.   Let's continue to wean from alprazolam.   Let's add lexapro (escitalopram) to help with the anxiety as you come off of the alprazolam.   Start 1/2 tab daily of the lexapro for the first week, then increase to a full tab daily.

## 2021-09-20 ENCOUNTER — Telehealth: Payer: Self-pay

## 2021-09-20 LAB — COMPLETE METABOLIC PANEL WITH GFR
AG Ratio: 1.9 (calc) (ref 1.0–2.5)
ALT: 19 U/L (ref 6–29)
AST: 19 U/L (ref 10–35)
Albumin: 4.6 g/dL (ref 3.6–5.1)
Alkaline phosphatase (APISO): 65 U/L (ref 37–153)
BUN/Creatinine Ratio: 27 (calc) — ABNORMAL HIGH (ref 6–22)
BUN: 13 mg/dL (ref 7–25)
CO2: 28 mmol/L (ref 20–32)
Calcium: 9.9 mg/dL (ref 8.6–10.4)
Chloride: 96 mmol/L — ABNORMAL LOW (ref 98–110)
Creat: 0.48 mg/dL — ABNORMAL LOW (ref 0.60–1.00)
Globulin: 2.4 g/dL (calc) (ref 1.9–3.7)
Glucose, Bld: 97 mg/dL (ref 65–99)
Potassium: 4 mmol/L (ref 3.5–5.3)
Sodium: 132 mmol/L — ABNORMAL LOW (ref 135–146)
Total Bilirubin: 0.5 mg/dL (ref 0.2–1.2)
Total Protein: 7 g/dL (ref 6.1–8.1)
eGFR: 97 mL/min/{1.73_m2} (ref >=60)

## 2021-09-20 LAB — CBC WITH DIFFERENTIAL/PLATELET
Absolute Monocytes: 655 cells/uL (ref 200–950)
Basophils Absolute: 69 cells/uL (ref 0–200)
Basophils Relative: 0.9 %
Eosinophils Absolute: 216 cells/uL (ref 15–500)
Eosinophils Relative: 2.8 %
HCT: 39.8 % (ref 35.0–45.0)
Hemoglobin: 13.9 g/dL (ref 11.7–15.5)
Lymphs Abs: 1863 cells/uL (ref 850–3900)
MCH: 32.4 pg (ref 27.0–33.0)
MCHC: 34.9 g/dL (ref 32.0–36.0)
MCV: 92.8 fL (ref 80.0–100.0)
MPV: 11.4 fL (ref 7.5–12.5)
Monocytes Relative: 8.5 %
Neutro Abs: 4897 cells/uL (ref 1500–7800)
Neutrophils Relative %: 63.6 %
Platelets: 318 10*3/uL (ref 140–400)
RBC: 4.29 10*6/uL (ref 3.80–5.10)
RDW: 11.6 % (ref 11.0–15.0)
Total Lymphocyte: 24.2 %
WBC: 7.7 10*3/uL (ref 3.8–10.8)

## 2021-09-20 LAB — TSH+FREE T4: TSH W/REFLEX TO FT4: 2.65 mIU/L (ref 0.40–4.50)

## 2021-09-20 LAB — LIPID PANEL W/REFLEX DIRECT LDL
Cholesterol: 260 mg/dL — ABNORMAL HIGH (ref <200)
HDL: 115 mg/dL (ref >=50)
LDL Cholesterol (Calc): 124 mg/dL (calc) — ABNORMAL HIGH
Non-HDL Cholesterol (Calc): 145 mg/dL (calc) — ABNORMAL HIGH (ref <130)
Total CHOL/HDL Ratio: 2.3 (calc) (ref <5.0)
Triglycerides: 107 mg/dL (ref <150)

## 2021-09-20 LAB — HEMOGLOBIN A1C
Hgb A1c MFr Bld: 5.6 % of total Hgb (ref <5.7)
Mean Plasma Glucose: 114 mg/dL
eAG (mmol/L): 6.3 mmol/L

## 2021-09-20 LAB — VITAMIN D 25 HYDROXY (VIT D DEFICIENCY, FRACTURES): Vit D, 25-Hydroxy: 74 ng/mL (ref 30–100)

## 2021-09-20 NOTE — Telephone Encounter (Signed)
Attempted to contact the pt. LVM advising I would callback in the morning.

## 2021-09-20 NOTE — Telephone Encounter (Signed)
Pt lvm requesting callback concerning possible kidney issues based on her lab results.

## 2021-09-20 NOTE — Telephone Encounter (Signed)
Please let patient know that kidney function looks ok.  Her sodium is a little low, but this is chronic and stable.  We can review in full detail at her upcoming visit.    Thanks!  CM

## 2021-09-21 ENCOUNTER — Telehealth (HOSPITAL_BASED_OUTPATIENT_CLINIC_OR_DEPARTMENT_OTHER): Payer: Self-pay | Admitting: *Deleted

## 2021-09-21 MED ORDER — METOPROLOL SUCCINATE ER 25 MG PO TB24: 25.0000 mg | ORAL_TABLET | Freq: Every day | ORAL | 3 refills | Status: DC

## 2021-09-21 NOTE — Telephone Encounter (Signed)
-----   Message from Loel Dubonnet, NP sent at 09/20/2021  7:50 PM EDT ----- Cardiac monitor with predominantly normal sinus rhythm. There were 31 episodes of atrial tachycardia - a fast heart rate in the top chamber of the heart that can cause palpitations. Recommend starting Metoprolol Succinate '25mg'$  daily and follow up as scheduled.

## 2021-09-21 NOTE — Telephone Encounter (Signed)
Pt lvm stating she was having a slight panic attack concerning her lab results.   Returned her call. Advised that her labs were stable compared to previous years labs.  Dr. Zigmund Daniel has no immediate concerns. He will discuss in detail at her upcoming visit.

## 2021-09-21 NOTE — Telephone Encounter (Signed)
Advised patient, verbalized understanding  

## 2021-09-27 ENCOUNTER — Encounter (HOSPITAL_BASED_OUTPATIENT_CLINIC_OR_DEPARTMENT_OTHER): Payer: Self-pay | Admitting: Family

## 2021-09-27 ENCOUNTER — Ambulatory Visit (INDEPENDENT_AMBULATORY_CARE_PROVIDER_SITE_OTHER): Payer: Medicare Other | Admitting: Family

## 2021-09-27 ENCOUNTER — Telehealth: Payer: Self-pay | Admitting: Family

## 2021-09-27 VITALS — BP 144/72 | HR 84 | Ht 63.0 in | Wt 130.0 lb

## 2021-09-27 DIAGNOSIS — I471 Supraventricular tachycardia: Secondary | ICD-10-CM

## 2021-09-27 DIAGNOSIS — R002 Palpitations: Secondary | ICD-10-CM | POA: Diagnosis not present

## 2021-09-27 DIAGNOSIS — I10 Essential (primary) hypertension: Secondary | ICD-10-CM

## 2021-09-27 MED ORDER — METOPROLOL SUCCINATE ER 50 MG PO TB24: 50.0000 mg | ORAL_TABLET | Freq: Every day | ORAL | 3 refills | Status: DC

## 2021-09-27 NOTE — Telephone Encounter (Signed)
Spoke with patient and advised to continue both, verbalized understanding

## 2021-09-28 NOTE — Telephone Encounter (Signed)
Agree with plan as provided.   Sims Laday S Andjela Wickes, NP  

## 2021-10-03 ENCOUNTER — Telehealth: Payer: Self-pay | Admitting: Cardiovascular Disease

## 2021-10-03 NOTE — Telephone Encounter (Signed)
  Pt c/o BP issue: STAT if pt c/o blurred vision, one-sided weakness or slurred speech  1. What are your last 5 BP readings?   149/76 66    2. Are you having any other symptoms (ex. Dizziness, headache, blurred vision, passed out)? She cant express herself, her memory is not good  3. What is your BP issue?  Pt said, her BP and HR been elevated again and Waking her up with her heart racing, she said, her meds is not working. She is currently out of town and scheduled an appt on 07/06. She wanted to speak with a nurse to discuss more of her symptoms

## 2021-10-03 NOTE — Telephone Encounter (Signed)
RN attempted to return call to patient, left message with instructions to return a call to the office

## 2021-10-03 NOTE — Telephone Encounter (Signed)
Patient's husband returned call to the office.   Patient's husbands states patient is waking every night with a racing heart that feels like it is beating out of her chest. BP is high and reports   Readings all from the last two days Most recent first  Today 8am- 149/76 66 158/74 69 141/76 64 153/78 81 134/64 66 164/79 69 166/86 88 167/75 85  Patient is currently taking 1/2 tab of Xanax during these attacks along with metoprolol succ '50mg'$  daily in the pm and valsartan  '40mg'$  daily in the morning, and escitalopram '10mg'$  daily (prescribed by Luetta Nutting)   Patient denies any chest pain or shortness of breath, they also note they want to see an EP doctor as soon as possible.   Patient is scheduled to see C. Walker on 7/6

## 2021-10-03 NOTE — Telephone Encounter (Signed)
Could try taking additional metoprolol succinate 50 mg in the AM and see if it helps. Keep appt with [facility].

## 2021-10-03 NOTE — Telephone Encounter (Signed)
RN returned call to patient and provided the following recommendations. Patient husband requested message with when she should be taking her medications.     Per Dr. Harrell Gave,   "Could try taking additional metoprolol succinate 50 mg in the AM and see if it helps. Keep appt with [facility]."

## 2021-10-04 NOTE — Telephone Encounter (Signed)
To be addressed at clinic visit.   Loel Dubonnet, NP

## 2021-10-06 ENCOUNTER — Encounter (HOSPITAL_BASED_OUTPATIENT_CLINIC_OR_DEPARTMENT_OTHER): Payer: Self-pay | Admitting: Family

## 2021-10-06 ENCOUNTER — Ambulatory Visit (INDEPENDENT_AMBULATORY_CARE_PROVIDER_SITE_OTHER): Payer: Medicare Other | Admitting: Family

## 2021-10-06 ENCOUNTER — Other Ambulatory Visit: Payer: Self-pay | Admitting: Family Medicine

## 2021-10-06 ENCOUNTER — Other Ambulatory Visit (HOSPITAL_BASED_OUTPATIENT_CLINIC_OR_DEPARTMENT_OTHER): Payer: Self-pay

## 2021-10-06 VITALS — BP 136/78 | HR 72 | Ht 63.0 in | Wt 130.0 lb

## 2021-10-06 DIAGNOSIS — I10 Essential (primary) hypertension: Secondary | ICD-10-CM | POA: Diagnosis not present

## 2021-10-06 DIAGNOSIS — I471 Supraventricular tachycardia: Secondary | ICD-10-CM | POA: Diagnosis not present

## 2021-10-06 DIAGNOSIS — F419 Anxiety disorder, unspecified: Secondary | ICD-10-CM | POA: Diagnosis not present

## 2021-10-06 DIAGNOSIS — R002 Palpitations: Secondary | ICD-10-CM | POA: Diagnosis not present

## 2021-10-06 MED ORDER — DILTIAZEM HCL ER COATED BEADS 120 MG PO CP24
120.0000 mg | ORAL_CAPSULE | Freq: Every day | ORAL | 1 refills | Status: DC
  Filled 2021-10-06: qty 90, 90d supply, fill #0

## 2021-10-06 MED ORDER — TRAZODONE HCL 50 MG PO TABS: 50.0000 mg | ORAL_TABLET | Freq: Every evening | ORAL | 0 refills | Status: DC | PRN

## 2021-10-06 MED ORDER — METOPROLOL SUCCINATE ER 50 MG PO TB24
50.0000 mg | ORAL_TABLET | Freq: Every day | ORAL | 1 refills | Status: DC
  Filled 2021-10-06: qty 90, 90d supply, fill #0

## 2021-10-06 NOTE — Progress Notes (Signed)
Advanced Hypertension Assessment:    Date:  10/06/2021   ID:  Tamara Pope, DOB 1946/01/16, MRN [id number]  PCP:  Luetta Nutting, DO  Cardiologist:  None  Nephrologist:  Referring MD: Luetta Nutting, DO   CC: Hypertension  History of Present Illness:    Tamara Pope is a 76 y.o. female with a hx of hypertension, anxiety, arthritis, GERD, DVT (04/2020 in the setting of breast implant), spasmodic dysphonia here for follow up of palpitations. Her maternal grandmother had heart attack and CAD.  Established with Advanced Hypertension Clinic 08/30/21. She was diagnosed with hypertension three years prior with BP labile based on stress. PCP had trialed Propranolol for BP and anxiety benefit but she did not tolerate. Limited salt in diet and stayed active with personal trainer, stretch exercises, walking. Did not tolerate Propranolol as felt 'jittery' and was started on Valsartan. 14 day ZIO placed due to palpations revealed NSR, 31 episodes of atrial tachycardia up to 11 beats, rare PVC/PAC. BMP, TSH were unremarkable. She was started on Metoprolol which was subsequently up titrated.   She presents today for follow up with her husband. Reviewed ZIO again with short episode of PAT. She is very interested in pursuing EP evaluation. Notes waking up every morning at 3 AM for the last 3 week with anxiety and palpitations. She has been on Lexapro for 2 weeks, discussed again that will take at least 4 weeks to work. She is taking Xanax half tablet twice per day. Continues to exercise twice per week with personal trainer and stretch class without dyspnea, chest pain. Her blood pressure at home is most often 140s-150s/80s.  Previous antihypertensives: Propranolol - felt jittery  Past Medical History:  Diagnosis Date   Allergy    Anxiety 1990   Arthritis    Dysphonia, spasmodic    GERD (gastroesophageal reflux disease)    History of DVT (deep vein thrombosis)    JAN 2002-- S/P BREAST  IMPLANTS  RIGHT UPPER ARM (AXILLARY/ SUBCLAVIN VEIN)   PMB (postmenopausal bleeding)     Past Surgical History:  Procedure Laterality Date   APPENDECTOMY  AS  CHILD   AUGMENTATION MAMMAPLASTY Bilateral    20 years ago   BILATERAL BREAST LIFT AND IMPLANT REDUCTION  12/2012   BREAST ENHANCEMENT SURGERY Bilateral 02/2000   CATARACT EXTRACTION W/ INTRAOCULAR LENS  IMPLANT, BILATERAL     COSMETIC SURGERY  1995   breast implants   DILATATION & CURRETTAGE/HYSTEROSCOPY WITH RESECTOCOPE N/A 04/18/2013   Procedure: DILATATION & CURETTAGE/HYSTEROSCOPY ;  Surgeon: [name], MD;  Location: [place];  Service: Gynecology;  Laterality: N/A;   EYE SURGERY  2015   cataracts   JOINT REPLACEMENT  2014   Knee replacement   KNEE ARTHROSCOPY W/ MENISCECTOMY Right 2011   ROTATOR CUFF REPAIR Right 2005   skin surgery     TONSILLECTOMY  AS CHILD   TOTAL KNEE ARTHROPLASTY Right 05/03/2015   Procedure: RIGHT TOTAL KNEE ARTHROPLASTY;  Surgeon: Vickey Huger, MD;  Location: [facility];  Service: Orthopedics;  Laterality: Right;    Current Medications: Current Meds  Medication Sig   ALPRAZolam (XANAX) 0.5 MG tablet Take 0.5 mg by mouth daily as needed.   Biotin 5000 MCG TABS Take 1 tablet by mouth daily.   Calcium Carb-Cholecalciferol 600-800 MG-UNIT TABS Take 1 tablet by mouth 2 (two) times daily.   Cholecalciferol (VITAMIN D3) 2000 units TABS Take 1 tablet by mouth daily.   Cranberry 400 MG CAPS cranberry  diltiazem (CARDIZEM CD) 120 MG 24 hr capsule Take 1 capsule (120 mg total) by mouth daily.   escitalopram (LEXAPRO) 10 MG tablet Take 1 tablet (10 mg total) by mouth daily.   estradiol (ESTRACE) 0.1 MG/GM vaginal cream Place vaginally as needed.   fluticasone (FLONASE) 50 MCG/ACT nasal spray Place 1 spray into both nostrils daily as needed for allergies or rhinitis.   Probiotic Product (ALIGN) 4 MG CAPS Take 1 capsule by mouth daily.   valsartan (DIOVAN) 40 MG tablet Take 1 tablet  (40 mg total) by mouth daily.   [DISCONTINUED] metoprolol succinate (TOPROL XL) 50 MG 24 hr tablet Take 1 tablet (50 mg total) by mouth daily.     Allergies:   Bee venom and Wasp venom   Social History   Socioeconomic History   Marital status: Married    Spouse name: Maleeah Crossman   Number of children: 2   Years of education: 14   Highest education level: Associate degree: academic program  Occupational History    Employer: [employer].   Occupation: Retired  Tobacco Use   Smoking status: Never   Smokeless tobacco: Never   Tobacco comments:    None  Vaping Use   Vaping Use: Never used  Substance and Sexual Activity   Alcohol use: Yes    Alcohol/week: 4.0 standard drinks of alcohol    Types: 4 Glasses of wine per week    Comment: 4 glasses of wine a week   Drug use: No   Sexual activity: Not Currently    Partners: Male  Other Topics Concern   Not on file  Social History Narrative   Lives with her husband. She has two daughters. She enjoys dancing and going out to different restaurants.   Social Determinants of Health   Financial Resource Strain: Low Risk  (06/24/2021)   Overall Financial Resource Strain (CARDIA)    Difficulty of Paying Living Expenses: Not hard at all  Food Insecurity: No Food Insecurity (06/20/2021)   Hunger Vital Sign    Worried About Running Out of Food in the Last Year: Never true    Ran Out of Food in the Last Year: Never true  Transportation Needs: No Transportation Needs (06/20/2021)   PRAPARE - Hydrologist (Medical): No    Lack of Transportation (Non-Medical): No  Physical Activity: Sufficiently Active (06/20/2021)   Exercise Vital Sign    Days of Exercise per Week: 3 days    Minutes of Exercise per Session: 60 min  Stress: No Stress Concern Present (06/24/2021)   [place]    Feeling of Stress : Only a little  Social Connections:  Socially Integrated (06/24/2021)   Social Connection and Isolation Panel [NHANES]    Frequency of Communication with Friends and Family: More than three times a week    Frequency of Social Gatherings with Friends and Family: Three times a week    Attends Religious Services: More than 4 times per year    Active Member of Clubs or Organizations: Yes    Attends Archivist Meetings: Patient refused    Marital Status: Married     Family History: The patient's family history includes Arthritis in her maternal grandmother and mother; Birth defects in her brother; Cancer in her father; Diabetes in her brother; Heart attack in her paternal grandmother; Heart disease in her maternal grandmother; Lung cancer in her father. There is no history of  Colon cancer or Stomach cancer.  ROS:   Please see the history of present illness.     All other systems reviewed and are negative.  EKGs/Labs/Other Studies Reviewed:    EKG:  No EKG today.   ZIO 08/2021 Quality: Fair.  Baseline artifact. Predominant rhythm:sinus rhythm Average heart rate: 82 bpm Max heart rate: 137 bpm in sinus rhythm.  176 bpm in atrial tachycardia Min heart rate: 53 bpm Pauses >2.5 seconds: none   Rare PACs and PVCs 31 episodes of atrial tachycardia up to 11 beats.  Fastest rate 176 bpm   Recent Labs: 08/30/2021: TSH 2.310 09/19/2021: ALT 19; BUN 13; Creat 0.48; Hemoglobin 13.9; Platelets 318; Potassium 4.0; Sodium 132   Recent Lipid Panel    Component Value Date/Time   CHOL 260 (H) 09/19/2021 0000   TRIG 107 09/19/2021 0000   HDL 115 09/19/2021 0000   CHOLHDL 2.3 09/19/2021 0000   VLDL 28.8 02/18/2019 1116   LDLCALC 124 (H) 09/19/2021 0000    Physical Exam:   VS:  BP 136/78   Pulse 72   Ht '5\' 3"'$  (1.6 m)   Wt 130 lb (59 kg)   BMI 23.03 kg/m  , BMI Body mass index is 23.03 kg/m. GENERAL:  Well appearing HEENT: Pupils equal round and reactive, fundi not visualized, oral mucosa unremarkable NECK:  No  jugular venous distention, waveform within normal limits, carotid upstroke brisk and symmetric, no bruits, no thyromegaly LYMPHATICS:  No cervical adenopathy LUNGS:  Clear to auscultation bilaterally HEART:  RRR.  PMI not displaced or sustained,S1 and S2 within normal limits, no S3, no S4, no clicks, no rubs,  murmurs ABD:  Flat, positive bowel sounds normal in frequency in pitch, no bruits, no rebound, no guarding, no midline pulsatile mass, no hepatomegaly, no splenomegaly EXT:  2 plus pulses throughout, no edema, no cyanosis no clubbing SKIN:  No rashes no nodules NEURO:  Cranial nerves II through XII grossly intact, motor grossly intact throughout PSYCH:  Cognitively intact, oriented to person place and time  ASSESSMENT/PLAN:    HTN - Diagnosed 3 years ago and BP has been labile often related to anxiety/stress. Did not tolerate Propranolol as it made her feel "jittery". Tolerating Valsartan '40mg'$  QD. Bp at home still 140s-150s. Predominant concern today anxiety and palpitations. Continue Toprol '50mg'$  QD, Valsartan '40mg'$  QD. Start Diltiazem '120mg'$  QD. Anticipate BP will improve with improvement in anxiety - could consider 24 hour BP monitor at follow up.  She will continue exercising 3 times per week.   Palpitations / PAT - Monitor 08/2021 with 31 episodes of atrial tachycardia (up to 11 beats, fastest rate 176 bpm). Avoids caffeine. Anticipate anxiety contributory. Reassurance provided. She requests referral to EP and wishes to be seen prior to leaving for her trip to [place] August 6th. Continue Toprol '50mg'$  QD, start Diltiazem '120mg'$  QD.   Anxiety - Follows with primary care. Contributory to labile blood pressure.Recently started Lexapro with goal to wean Xanax. Given waking up at 3 am every night with anxiety, recommend take whole tablet of Xanax at bedtime as prescribed rather than half tablet.   Spasmodic dysphonia -Continue to follow with PCP.   Screening for Secondary Hypertension:      08/30/2021    5:06 PM  Causes  Drugs/Herbals Screened  Sleep Apnea Screened     - Comments No snoring.  Thyroid Disease Screened     - Comments 02/2019 TSH 1.86. Update TSH 08/30/21.  Pheochromocytoma Screened     -  Comments Notes palpitations, diaphoresis. Consider urine metanephrines at follow up.  Cushing's Syndrome Screened     - Comments No cushinoid appearance.  Coarctation of the Aorta Screened     - Comments R arm BP 163/84 and L arm 144/81.  Compliance Screened    Relevant Labs/Studies:    Latest Ref Rng & Units 09/19/2021   12:00 AM 08/30/2021    3:10 PM 05/07/2020    2:44 PM  Basic Labs  Sodium 135 - 146 mmol/L 132  134  132   Potassium 3.5 - 5.3 mmol/L 4.0  5.1  4.2   Creatinine 0.60 - 1.00 mg/dL 0.48  0.49  0.48        Latest Ref Rng & Units 08/30/2021    3:10 PM 02/18/2019   11:16 AM  Thyroid   TSH 0.450 - 4.500 uIU/mL 2.310  1.86      Disposition:    FU with Dr. Oval Linsey or Loel Dubonnet, NP in 2 months   Medication Adjustments/Labs and Tests Ordered: Current medicines are reviewed at length with the patient today.  Concerns regarding medicines are outlined above.  Orders Placed This Encounter  Procedures   Ambulatory referral to Cardiac Electrophysiology   Meds ordered this encounter  Medications   diltiazem (CARDIZEM CD) 120 MG 24 hr capsule    Sig: Take 1 capsule (120 mg total) by mouth daily.    Dispense:  90 capsule    Refill:  1    Order Specific Question:   Supervising Provider    Answer:   Buford Dresser [[id number]]   metoprolol succinate (TOPROL XL) 50 MG 24 hr tablet    Sig: Take 1 tablet (50 mg total) by mouth daily.    Dispense:  90 tablet    Refill:  1    NEW DOSE, D/C 25 MG RX    Order Specific Question:   Supervising Provider    Answer:   Buford Dresser [[id number]]   Signed, Loel Dubonnet, NP  10/06/2021 9:46 AM    [place]

## 2021-10-06 NOTE — Patient Instructions (Addendum)
Medication Instructions:  Your physician has recommended you make the following change in your medication:   CHANGE Metoprolol Succinate to '50mg'$  daily in the evening *tonight take '25mg'$  then tomorrow night take '50mg'$    START Diltiazem '120mg'$  daily in the evening  CONTINUE Valsartan '40mg'$  daily in the evening  TRY taking whole tablet of Xanax at bedtime   *If you need a refill on your cardiac medications before your next appointment, please call your pharmacy*   Lab Work: None ordered today.   Testing/Procedures: None ordered today.   Follow-Up: At [facility], you and your health needs are our priority.  As part of our continuing mission to provide you with exceptional heart care, we have created designated Provider Care Teams.  These Care Teams include your primary Cardiologist (physician) and Advanced Practice Providers (APPs -  Physician Assistants and Nurse Practitioners) who all work together to provide you with the care you need, when you need it.  We recommend signing up for the patient portal called "MyChart".  Sign up information is provided on this After Visit Summary.  MyChart is used to connect with patients for Virtual Visits (Telemedicine).  Patients are able to view lab/test results, encounter notes, upcoming appointments, etc.  Non-urgent messages can be sent to your provider as well.   To learn more about what you can do with MyChart, go to [email or web address].    Your next appointment:   With electrophysiology  Other Instructions  To prevent palpitations: Make sure you are adequately hydrated.  Avoid and/or limit caffeine containing beverages like soda or tea. Exercise regularly.  Manage stress well. Some over the counter medications can cause palpitations such as Benadryl, AdvilPM, TylenolPM. Regular Advil or Tylenol do not cause palpitations.

## 2021-10-13 ENCOUNTER — Encounter: Payer: Self-pay | Admitting: Family Medicine

## 2021-10-13 ENCOUNTER — Ambulatory Visit (INDEPENDENT_AMBULATORY_CARE_PROVIDER_SITE_OTHER): Payer: Medicare Other | Admitting: Family Medicine

## 2021-10-13 ENCOUNTER — Ambulatory Visit: Payer: Medicare Other | Admitting: Family Medicine

## 2021-10-13 DIAGNOSIS — I10 Essential (primary) hypertension: Secondary | ICD-10-CM | POA: Diagnosis not present

## 2021-10-13 DIAGNOSIS — F411 Generalized anxiety disorder: Secondary | ICD-10-CM | POA: Diagnosis not present

## 2021-10-13 MED ORDER — TRAZODONE HCL 50 MG PO TABS: 50.0000 mg | ORAL_TABLET | Freq: Every evening | ORAL | 1 refills | Status: DC | PRN

## 2021-10-13 MED ORDER — ESCITALOPRAM OXALATE 10 MG PO TABS: 15.0000 mg | ORAL_TABLET | Freq: Every day | ORAL | 1 refills | Status: DC

## 2021-10-13 NOTE — Assessment & Plan Note (Addendum)
She is trying to wean from alprazolam due to concern about long-term side effects.  She was originally started on this for spasmodic dysphonia.  She is doing fairly well with Lexapro and tolerating well.  I recommended that if she continues to have significant breakthrough anxiety at the 6 to 7-week mark then I would recommend she increase Lexapro to 15 mg.  Additionally she may increase her trazodone to 100 mg at bedtime insomnia.  They are going to [place] next month and recommend she follow-up when she returns.

## 2021-10-13 NOTE — Progress Notes (Signed)
Tamara Pope - 76 y.o. female MRN [id number]  Date of birth: 05-10-1945  Subjective No chief complaint on file.   HPI Tamara Pope is a 76 year old female here today for follow-up on anxiety.  Additionally, she has been seeing the advanced hypertension clinic due to elevated blood pressures.  Readings at home have been elevated however are normal here in the clinic today.  She is on multiple antihypertensives including metoprolol, valsartan and diltiazem.  She does have an upcoming visit with electrophysiology.  14-day Zio patch Without any significant findings.  As far as her anxiety goes she has been trying to wean from alprazolam.  We have started her on Lexapro previously.  She does feel like this has been helpful so far.  She is tolerating well without significant side effects.  Trazodone was also added recently due to insomnia.  She has slept better with taking this.  She did wake up last night with panic symptoms and tachycardia, requiring her to use alprazolam.  ROS:  A comprehensive ROS was completed and negative except as noted per HPI  Allergies  Allergen Reactions   Bee Venom Anaphylaxis   Wasp Venom Anaphylaxis    Past Medical History:  Diagnosis Date   Allergy    Anxiety 1990   Arthritis    Dysphonia, spasmodic    GERD (gastroesophageal reflux disease)    History of DVT (deep vein thrombosis)    JAN 2002-- S/P BREAST IMPLANTS  RIGHT UPPER ARM (AXILLARY/ SUBCLAVIN VEIN)   PMB (postmenopausal bleeding)     Past Surgical History:  Procedure Laterality Date   APPENDECTOMY  AS  CHILD   AUGMENTATION MAMMAPLASTY Bilateral    20 years ago   BILATERAL BREAST LIFT AND IMPLANT REDUCTION  12/2012   BREAST ENHANCEMENT SURGERY Bilateral 02/2000   CATARACT EXTRACTION W/ INTRAOCULAR LENS  IMPLANT, BILATERAL     COSMETIC SURGERY  1995   breast implants   DILATATION & CURRETTAGE/HYSTEROSCOPY WITH RESECTOCOPE N/A 04/18/2013   Procedure: DILATATION & CURETTAGE/HYSTEROSCOPY ;   Surgeon: [name], MD;  Location: [place];  Service: Gynecology;  Laterality: N/A;   EYE SURGERY  2015   cataracts   JOINT REPLACEMENT  2014   Knee replacement   KNEE ARTHROSCOPY W/ MENISCECTOMY Right 2011   ROTATOR CUFF REPAIR Right 2005   skin surgery     TONSILLECTOMY  AS CHILD   TOTAL KNEE ARTHROPLASTY Right 05/03/2015   Procedure: RIGHT TOTAL KNEE ARTHROPLASTY;  Surgeon: Vickey Huger, MD;  Location: [place];  Service: Orthopedics;  Laterality: Right;    Social History   Socioeconomic History   Marital status: Married    Spouse name: Lamona Eimer   Number of children: 2   Years of education: 14   Highest education level: Associate degree: academic program  Occupational History    Employer: [employer].   Occupation: Retired  Tobacco Use   Smoking status: Never   Smokeless tobacco: Never   Tobacco comments:    None  Vaping Use   Vaping Use: Never used  Substance and Sexual Activity   Alcohol use: Yes    Alcohol/week: 4.0 standard drinks of alcohol    Types: 4 Glasses of wine per week    Comment: 4 glasses of wine a week   Drug use: No   Sexual activity: Not Currently    Partners: Male  Other Topics Concern   Not on file  Social History Narrative   Lives with her husband. She  has two daughters. She enjoys dancing and going out to different restaurants.   Social Determinants of Health   Financial Resource Strain: Low Risk  (06/24/2021)   Overall Financial Resource Strain (CARDIA)    Difficulty of Paying Living Expenses: Not hard at all  Food Insecurity: No Food Insecurity (06/20/2021)   Hunger Vital Sign    Worried About Running Out of Food in the Last Year: Never true    Ran Out of Food in the Last Year: Never true  Transportation Needs: No Transportation Needs (06/20/2021)   PRAPARE - Hydrologist (Medical): No    Lack of Transportation (Non-Medical): No  Physical Activity: Sufficiently Active  (06/20/2021)   Exercise Vital Sign    Days of Exercise per Week: 3 days    Minutes of Exercise per Session: 60 min  Stress: No Stress Concern Present (06/24/2021)   [place]    Feeling of Stress : Only a little  Social Connections: Socially Integrated (06/24/2021)   Social Connection and Isolation Panel [NHANES]    Frequency of Communication with Friends and Family: More than three times a week    Frequency of Social Gatherings with Friends and Family: Three times a week    Attends Religious Services: More than 4 times per year    Active Member of Clubs or Organizations: Yes    Attends Archivist Meetings: Patient refused    Marital Status: Married    Family History  Problem Relation Age of Onset   Arthritis Mother    Lung cancer Father    Cancer Father    Diabetes Brother        age 35   Birth defects Brother    Arthritis Maternal Grandmother    Heart disease Maternal Grandmother    Heart attack Paternal Grandmother    Colon cancer Neg Hx    Stomach cancer Neg Hx     Health Maintenance  Topic Date Due   COVID-19 Vaccine (4 - Booster for [place] series) 12/20/2021 (Originally 09/28/2020)   TETANUS/TDAP  06/25/2022 (Originally 11/04/2019)   INFLUENZA VACCINE  11/01/2021   Pneumonia Vaccine 48+ Years old  Completed   DEXA SCAN  Completed   Hepatitis C Screening  Completed   Zoster Vaccines- Shingrix  Completed   HPV VACCINES  Aged Out   COLONOSCOPY (Pts 45-70yr Insurance coverage will need to be confirmed)  Discontinued     ----------------------------------------------------------------------------------------------------------------------------------------------------------------------------------------------------------------- Physical Exam BP (!) 110/54 (BP Location: Right Arm, Patient Position: Sitting, Cuff Size: Normal)   Pulse 71   Ht '5\' 3"'$  (1.6 m)   Wt 129 lb (58.5 kg)   SpO2 97%   BMI  22.85 kg/m   Physical Exam Constitutional:      Appearance: Normal appearance.  Eyes:     General: No scleral icterus. Musculoskeletal:     Cervical back: Neck supple.  Neurological:     General: No focal deficit present.     Mental Status: She is alert.  Psychiatric:        Mood and Affect: Mood normal.        Behavior: Behavior normal.     ------------------------------------------------------------------------------------------------------------------------------------------------------------------------------------------------------------------- Assessment and Plan  Essential hypertension Blood pressure is well controlled at this time.  Recommend continuation of current medications for management of hypertension.  Asked her to bring her blood pressure cuff from home to her next visit so we can be sure this is calibrated properly.  GAD (generalized anxiety disorder) She is trying to wean from alprazolam due to concern about long-term side effects.  She was originally started on this for spasmodic dysphonia.  She is doing fairly well with Lexapro and tolerating well.  I recommended that if she continues to have significant breakthrough anxiety at the 6 to 7-week mark then I would recommend she increase Lexapro to 15 mg.  Additionally she may increase her trazodone to 100 mg at bedtime insomnia.  They are going to [place] next month and recommend she follow-up when she returns.   Meds ordered this encounter  Medications   traZODone (DESYREL) 50 MG tablet    Sig: Take 1-2 tablets (50-100 mg total) by mouth at bedtime as needed for sleep.    Dispense:  90 tablet    Refill:  1   escitalopram (LEXAPRO) 10 MG tablet    Sig: Take 1.5 tablets (15 mg total) by mouth at bedtime.    Dispense:  135 tablet    Refill:  1    No follow-ups on file.    This visit occurred during the SARS-CoV-2 public health emergency.  Safety protocols were in place, including screening questions prior  to the visit, additional usage of staff PPE, and extensive cleaning of exam room while observing appropriate contact time as indicated for disinfecting solutions.

## 2021-10-13 NOTE — Patient Instructions (Signed)
Nice to see you today!  If you continue to have a lot of breakthrough anxiety after the next 2-3 weeks you may increase the lexapro to 1.5 tabs ('15mg'$ ) each day.   Try increasing trazodone to '100mg'$  at bedtime.  If you wake up with panic symptoms/racing heart you may use the alprazolam.    You can take your valsartan and metoprolol in the morning.   See me again once you return from [place] but keep me updated or message as needed.

## 2021-10-13 NOTE — Assessment & Plan Note (Signed)
Blood pressure is well controlled at this time.  Recommend continuation of current medications for management of hypertension.  Asked her to bring her blood pressure cuff from home to her next visit so we can be sure this is calibrated properly.

## 2021-10-17 ENCOUNTER — Ambulatory Visit (INDEPENDENT_AMBULATORY_CARE_PROVIDER_SITE_OTHER): Payer: Medicare Other | Admitting: Cardiology

## 2021-10-17 ENCOUNTER — Encounter: Payer: Self-pay | Admitting: Cardiology

## 2021-10-17 VITALS — BP 140/82 | HR 80 | Ht 63.0 in | Wt 128.4 lb

## 2021-10-17 DIAGNOSIS — I471 Supraventricular tachycardia: Secondary | ICD-10-CM

## 2021-10-17 DIAGNOSIS — F419 Anxiety disorder, unspecified: Secondary | ICD-10-CM

## 2021-10-17 DIAGNOSIS — R002 Palpitations: Secondary | ICD-10-CM

## 2021-10-17 NOTE — Progress Notes (Signed)
Electrophysiology Office Note:    Date:  10/17/2021   ID:  Tamara, [name] Pope, MRN [id number]  PCP:  Tamara Nutting, DO  [facility] Cardiologist:  None  [facility] Electrophysiologist:  None   Referring MD: Tamara Dubonnet, NP   Chief Complaint: Palpitations  History of Present Illness:    Tamara Pope is a 76 y.o. female who presents for an evaluation of palpitations at the request of Tamara [place], NP. Their medical history includes anxiety, dysphonia, DVT.  The patient last saw Tamara Pope on September 27, 2021.  The patient been tried on multiple beta-blockers for symptoms of palpitations.  She is worn a heart monitor which did show PACs that were rare.  Symptom triggered episodes during the heart monitor did not correlate to PACs.  The patient and her husband report significant anxiety which is likely driving a lot of her symptoms.  She has a general concern about the amount of medication she is taking.  Both the patient and her husband feel that it may be her medications that are driving some of her symptoms.     Past Medical History:  Diagnosis Date   Allergy    Anxiety 1990   Arthritis    Dysphonia, spasmodic    GERD (gastroesophageal reflux disease)    History of DVT (deep vein thrombosis)    JAN 2002-- S/P BREAST IMPLANTS  RIGHT UPPER ARM (AXILLARY/ SUBCLAVIN VEIN)   PMB (postmenopausal bleeding)     Past Surgical History:  Procedure Laterality Date   APPENDECTOMY  AS  CHILD   AUGMENTATION MAMMAPLASTY Bilateral    20 years ago   BILATERAL BREAST LIFT AND IMPLANT REDUCTION  12/2012   BREAST ENHANCEMENT SURGERY Bilateral 02/2000   CATARACT EXTRACTION W/ INTRAOCULAR LENS  IMPLANT, BILATERAL     COSMETIC SURGERY  1995   breast implants   DILATATION & CURRETTAGE/HYSTEROSCOPY WITH RESECTOCOPE N/A 04/18/2013   Procedure: DILATATION & CURETTAGE/HYSTEROSCOPY ;  Surgeon: [name], MD;  Location: [place];   Service: Gynecology;  Laterality: N/A;   EYE SURGERY  2015   cataracts   JOINT REPLACEMENT  2014   Knee replacement   KNEE ARTHROSCOPY W/ MENISCECTOMY Right 2011   ROTATOR CUFF REPAIR Right 2005   skin surgery     TONSILLECTOMY  AS CHILD   TOTAL KNEE ARTHROPLASTY Right 05/03/2015   Procedure: RIGHT TOTAL KNEE ARTHROPLASTY;  Surgeon: Vickey Huger, MD;  Location: [place];  Service: Orthopedics;  Laterality: Right;    Current Medications: Current Meds  Medication Sig   ALPRAZolam (XANAX) 0.5 MG tablet Take 0.5 mg by mouth daily as needed.   Biotin 5000 MCG TABS Take 1 tablet by mouth daily.   Calcium Carb-Cholecalciferol 600-800 MG-UNIT TABS Take 1 tablet by mouth 2 (two) times daily.   Cholecalciferol (VITAMIN D3) 2000 units TABS Take 1 tablet by mouth daily.   Cranberry 400 MG CAPS cranberry   escitalopram (LEXAPRO) 10 MG tablet Take 1.5 tablets (15 mg total) by mouth at bedtime.   estradiol (ESTRACE) 0.1 MG/GM vaginal cream Place vaginally as needed.   fluticasone (FLONASE) 50 MCG/ACT nasal spray Place 1 spray into both nostrils daily as needed for allergies or rhinitis.   metoprolol succinate (TOPROL XL) 50 MG 24 hr tablet Take 1 tablet (50 mg total) by mouth daily.   Probiotic Product (ALIGN) 4 MG CAPS Take 1 capsule by mouth daily.   traZODone (DESYREL) 50 MG tablet Take 1-2 tablets (50-100  mg total) by mouth at bedtime as needed for sleep.   valsartan (DIOVAN) 40 MG tablet Take 1 tablet (40 mg total) by mouth daily.   [DISCONTINUED] diltiazem (CARDIZEM CD) 120 MG 24 hr capsule Take 1 capsule (120 mg total) by mouth daily.     Allergies:   Bee venom and Wasp venom   Social History   Socioeconomic History   Marital status: Married    Spouse name: Tamara Pope   Number of children: 2   Years of education: 14   Highest education level: Associate degree: academic program  Occupational History    Employer: [employer].   Occupation: Retired  Tobacco Use   Smoking  status: Never   Smokeless tobacco: Never   Tobacco comments:    None  Vaping Use   Vaping Use: Never used  Substance and Sexual Activity   Alcohol use: Yes    Alcohol/week: 4.0 standard drinks of alcohol    Types: 4 Glasses of wine per week    Comment: 4 glasses of wine a week   Drug use: No   Sexual activity: Not Currently    Partners: Male  Other Topics Concern   Not on file  Social History Narrative   Lives with her husband. She has two daughters. She enjoys dancing and going out to different restaurants.   Social Determinants of Health   Financial Resource Strain: Low Risk  (06/24/2021)   Overall Financial Resource Strain (CARDIA)    Difficulty of Paying Living Expenses: Not hard at all  Food Insecurity: No Food Insecurity (06/20/2021)   Hunger Vital Sign    Worried About Running Out of Food in the Last Year: Never true    Ran Out of Food in the Last Year: Never true  Transportation Needs: No Transportation Needs (06/20/2021)   PRAPARE - Hydrologist (Medical): No    Lack of Transportation (Non-Medical): No  Physical Activity: Sufficiently Active (06/20/2021)   Exercise Vital Sign    Days of Exercise per Week: 3 days    Minutes of Exercise per Session: 60 min  Stress: No Stress Concern Present (06/24/2021)   [place]    Feeling of Stress : Only a little  Social Connections: Socially Integrated (06/24/2021)   Social Connection and Isolation Panel [NHANES]    Frequency of Communication with Friends and Family: More than three times a week    Frequency of Social Gatherings with Friends and Family: Three times a week    Attends Religious Services: More than 4 times per year    Active Member of Clubs or Organizations: Yes    Attends Archivist Meetings: Patient refused    Marital Status: Married     Family History: The patient's family history includes Arthritis in her  maternal grandmother and mother; Birth defects in her brother; Cancer in her father; Diabetes in her brother; Heart attack in her paternal grandmother; Heart disease in her maternal grandmother; Lung cancer in her father. There is no history of Colon cancer or Stomach cancer.  ROS:   Please see the history of present illness.    All other systems reviewed and are negative.  EKGs/Labs/Other Studies Reviewed:    The following studies were reviewed today:  September 20, 2021 ZIO monitor personally reviewed with the patient and her husband during today's visit.  Rare PACs.  31 episodes of atrial tachycardia, longest 11 beats during the nighttime  hours.  These episodes did not correspond to symptoms.  EKG:  The ekg ordered today demonstrates sinus rhythm.  Normal intervals.   Recent Labs: 08/30/2021: TSH 2.310 09/19/2021: ALT 19; BUN 13; Creat 0.48; Hemoglobin 13.9; Platelets 318; Potassium 4.0; Sodium 132  Recent Lipid Panel    Component Value Date/Time   CHOL 260 (H) 09/19/2021 0000   TRIG 107 09/19/2021 0000   HDL 115 09/19/2021 0000   CHOLHDL 2.3 09/19/2021 0000   VLDL 28.8 02/18/2019 1116   LDLCALC 124 (H) 09/19/2021 0000    Physical Exam:    VS:  BP 140/82   Pulse 80   Ht '5\' 3"'$  (1.6 m)   Wt 128 lb 6.4 oz (58.2 kg)   SpO2 97%   BMI 22.75 kg/m     Wt Readings from Last 3 Encounters:  10/17/21 128 lb 6.4 oz (58.2 kg)  10/13/21 129 lb (58.5 kg)  10/06/21 130 lb (59 kg)     GEN:  Well nourished, well developed in no acute distress.  Appears younger than stated age 80: Normal NECK: No JVD; No carotid bruits LYMPHATICS: No lymphadenopathy CARDIAC: RRR, no murmurs, rubs, gallops RESPIRATORY:  Clear to auscultation without rales, wheezing or rhonchi  ABDOMEN: Soft, non-tender, non-distended MUSCULOSKELETAL:  No edema; No deformity  SKIN: Warm and dry NEUROLOGIC:  Alert and oriented x 3 PSYCHIATRIC:  Normal affect       ASSESSMENT:    1. PAT (paroxysmal atrial  tachycardia) (HCC)   2. Palpitations   3. Anxiety    PLAN:    In order of problems listed above:  #PAT #Palpitations The patient has significant palpitations.  Heart monitor shows that she has rare PACs and short salvos of atrial tachycardia.  The atrial tachycardia episodes do not correspond to symptoms.  When she depressed the symptom triggered button on the Holter monitor the patient's rhythm was sinus.  We discussed this in detail during today's clinic appointment.  I suspect many of her symptoms are actually being driven by anxiety.  The patient agrees with this assessment.  I reassured her that I do not see any evidence of sustained SVT on the heart monitor and would not recommend any additional work-up or monitoring.  I do think that we can should move towards simplifying her medication regimen.  I do not think Xanax is a good long-term management strategy for her anxiety.  I agree with the decision to start Lexapro.  She will follow-up with her primary care physician to see if she can uptitrate Lexapro in an effort to wean off of Xanax.  I do not think she needs to be on 2 forms of nodal blockers.  I will stop the diltiazem today.  She can continue the metoprolol.  If she does well stopping the diltiazem her primary care physician could decrease the dose of metoprolol to 25 mg by mouth once daily.  I Georgina Peer refer her to Elias Else, PsyD to help her work through some of the anxiety surrounding her palpitations.  She was very open to this idea.  She will follow-up with me on an as-needed basis.   Total time spent with patient today 50 minutes. This includes reviewing records, evaluating the patient and coordinating care.  Medication Adjustments/Labs and Tests Ordered: Current medicines are reviewed at length with the patient today.  Concerns regarding medicines are outlined above.  Orders Placed This Encounter  Procedures   Ambulatory referral to Psychology   EKG 12-Lead   No  orders of the defined types were placed in this encounter.    Signed, Hilton Cork. Quentin Ore, MD, [facility], [facility] 10/17/2021 9:52 PM    Electrophysiology Center [facility]

## 2021-10-17 NOTE — Patient Instructions (Addendum)
Medication Instructions:  Stop Diltiazem  Your physician recommends that you continue on your current medications as directed. Please refer to the Current Medication list given to you today. *If you need a refill on your cardiac medications before your next appointment, please call your pharmacy*  Lab Work: None. If you have labs (blood work) drawn today and your tests are completely normal, you will receive your results only by: [place] (if you have MyChart) OR A paper copy in the mail If you have any lab test that is abnormal or we need to change your treatment, we will call you to review the results.  Testing/Procedures: None.  Follow-Up: At [facility], you and your health needs are our priority.  As part of our continuing mission to provide you with exceptional heart care, we have created designated Provider Care Teams.  These Care Teams include your primary Cardiologist (physician) and Advanced Practice Providers (APPs -  Physician Assistants and Nurse Practitioners) who all work together to provide you with the care you need, when you need it.  Your physician wants you to follow-up in: As needed with Lars Mage, MD   We recommend signing up for the patient portal called "MyChart".  Sign up information is provided on this After Visit Summary.  MyChart is used to connect with patients for Virtual Visits (Telemedicine).  Patients are able to view lab/test results, encounter notes, upcoming appointments, etc.  Non-urgent messages can be sent to your provider as well.   To learn more about what you can do with MyChart, go to [email or web address].    Any Other Special Instructions Will Be Listed Below (If Applicable).

## 2021-10-28 ENCOUNTER — Other Ambulatory Visit: Payer: Self-pay | Admitting: Family Medicine

## 2021-11-02 DIAGNOSIS — H0014 Chalazion left upper eyelid: Secondary | ICD-10-CM | POA: Diagnosis not present

## 2021-11-02 DIAGNOSIS — Z961 Presence of intraocular lens: Secondary | ICD-10-CM | POA: Diagnosis not present

## 2021-11-02 DIAGNOSIS — H02883 Meibomian gland dysfunction of right eye, unspecified eyelid: Secondary | ICD-10-CM | POA: Diagnosis not present

## 2021-11-02 DIAGNOSIS — H04123 Dry eye syndrome of bilateral lacrimal glands: Secondary | ICD-10-CM | POA: Diagnosis not present

## 2021-11-02 DIAGNOSIS — H02886 Meibomian gland dysfunction of left eye, unspecified eyelid: Secondary | ICD-10-CM | POA: Diagnosis not present

## 2021-11-14 ENCOUNTER — Other Ambulatory Visit: Payer: Self-pay | Admitting: Family Medicine

## 2021-11-28 ENCOUNTER — Other Ambulatory Visit: Payer: Self-pay | Admitting: Family Medicine

## 2021-11-28 ENCOUNTER — Telehealth: Payer: Self-pay | Admitting: Family Medicine

## 2021-11-28 ENCOUNTER — Ambulatory Visit (HOSPITAL_BASED_OUTPATIENT_CLINIC_OR_DEPARTMENT_OTHER): Payer: Medicare Other | Admitting: Family

## 2021-11-28 MED ORDER — NIRMATRELVIR/RITONAVIR (PAXLOVID)TABLET: 3.0000 | ORAL_TABLET | Freq: Two times a day (BID) | ORAL | 0 refills | Status: AC

## 2021-11-28 NOTE — Telephone Encounter (Signed)
Received call from patient that she has tested positive for COVID-19.  Symptomatic for 24 hours.  She has fatigue, body aches, congestion and mild cough.  No dyspnea at this time.  She traveled to [place] recently.  Recommend OTC supportive care with increased fluids, antipyretics/analgesics, delsym and mucinex.   Adding course of Paxlovid as well. Instructed to contact clinic if course changes or worsens.

## 2021-12-08 ENCOUNTER — Ambulatory Visit (INDEPENDENT_AMBULATORY_CARE_PROVIDER_SITE_OTHER): Payer: Medicare Other | Admitting: Psychologist

## 2021-12-08 DIAGNOSIS — F411 Generalized anxiety disorder: Secondary | ICD-10-CM | POA: Diagnosis not present
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
## 2021-12-08 NOTE — Progress Notes (Signed)
                Ghali Morissette, PsyD 

## 2021-12-08 NOTE — Plan of Care (Signed)

## 2021-12-19 DIAGNOSIS — H02881 Meibomian gland dysfunction right upper eyelid: Secondary | ICD-10-CM | POA: Diagnosis not present

## 2021-12-19 DIAGNOSIS — H02885 Meibomian gland dysfunction left lower eyelid: Secondary | ICD-10-CM | POA: Diagnosis not present

## 2021-12-19 DIAGNOSIS — H0011 Chalazion right upper eyelid: Secondary | ICD-10-CM | POA: Diagnosis not present

## 2021-12-21 DIAGNOSIS — J385 Laryngeal spasm: Secondary | ICD-10-CM | POA: Diagnosis not present

## 2021-12-29 ENCOUNTER — Ambulatory Visit (HOSPITAL_BASED_OUTPATIENT_CLINIC_OR_DEPARTMENT_OTHER): Payer: Medicare Other | Admitting: Family

## 2022-01-02 ENCOUNTER — Encounter (HOSPITAL_BASED_OUTPATIENT_CLINIC_OR_DEPARTMENT_OTHER): Payer: Self-pay | Admitting: Family

## 2022-01-02 ENCOUNTER — Ambulatory Visit (INDEPENDENT_AMBULATORY_CARE_PROVIDER_SITE_OTHER): Payer: Medicare Other | Admitting: Family

## 2022-01-02 VITALS — BP 114/62 | HR 89 | Ht 63.0 in | Wt 126.0 lb

## 2022-01-02 DIAGNOSIS — I10 Essential (primary) hypertension: Secondary | ICD-10-CM

## 2022-01-02 DIAGNOSIS — R002 Palpitations: Secondary | ICD-10-CM

## 2022-01-02 MED ORDER — VALSARTAN 40 MG PO TABS: 20.0000 mg | ORAL_TABLET | Freq: Every day | ORAL | 3 refills | Status: DC

## 2022-01-02 NOTE — Patient Instructions (Addendum)
Medication Instructions:  Continue your current medications.   *If you need a refill on your cardiac medications before your next appointment, please call your pharmacy*  Lab Work/Testing/Procedures: None ordered today.   Follow-Up: At [facility], you and your health needs are our priority.  As part of our continuing mission to provide you with exceptional heart care, we have created designated Provider Care Teams.  These Care Teams include your primary Cardiologist (physician) and Advanced Practice Providers (APPs -  Physician Assistants and Nurse Practitioners) who all work together to provide you with the care you need, when you need it.  We recommend signing up for the patient portal called "MyChart".  Sign up information is provided on this After Visit Summary.  MyChart is used to connect with patients for Virtual Visits (Telemedicine).  Patients are able to view lab/test results, encounter notes, upcoming appointments, etc.  Non-urgent messages can be sent to your provider as well.   To learn more about what you can do with MyChart, go to [email or web address].    Your next appointment:   6 month(s)  The format for your next appointment:   In Person  Provider:   Skeet Latch, MD or Laurann Montana, NP    Other Instructions  Heart Healthy Diet Recommendations: A low-salt diet is recommended. Meats should be grilled, baked, or boiled. Avoid fried foods. Focus on lean protein sources like fish or chicken with vegetables and fruits. The American Heart Association is a Microbiologist!  American Heart Association Diet and Lifeystyle Recommendations   Exercise recommendations: The American Heart Association recommends 150 minutes of moderate intensity exercise weekly. Try 30 minutes of moderate intensity exercise 4-5 times per week. This could include walking, jogging, or swimming.  To prevent palpitations: Make sure you are adequately hydrated.  Avoid and/or limit  caffeine containing beverages like soda or tea. Exercise regularly.  Manage stress well. Some over the counter medications can cause palpitations such as Benadryl, AdvilPM, TylenolPM. Regular Advil or Tylenol do not cause palpitations.    Important Information About Sugar

## 2022-01-02 NOTE — Progress Notes (Signed)
Advanced Hypertension Assessment:    Date:  01/02/2022   ID:  Tamara Pope, DOB 1946/02/03, MRN [id number]  PCP:  Tamara Nutting, DO  Cardiologist:  None  Nephrologist:  Referring MD: Tamara Nutting, DO   CC: Hypertension  History of Present Illness:    Tamara Pope is a 76 y.o. female with a hx of hypertension, anxiety, arthritis, GERD, DVT (04/2020 in the setting of breast implant), spasmodic dysphonia here for follow up of hypertension. Her maternal grandmother had heart attack and CAD. Last seen 10/17/21.   Established with [facility] 08/30/21. She was diagnosed with hypertension three years prior with BP labile based on stress. PCP had trialed Propranolol for BP and anxiety benefit but she did not tolerate. Limited salt in diet and stayed active with personal trainer, stretch exercises, walking. Did not tolerate Propranolol as felt 'jittery' and was started on Valsartan. 14 day ZIO placed due to palpations revealed NSR, 31 episodes of atrial tachycardia up to 11 beats, rare PVC/PAC. BMP, TSH were unremarkable. She was started on Metoprolol and had visit with the EP who felt her rare PVC/PACs were predominantly related to anxiety.   She presents today for follow up with her husband. BP at home 110s/70s without lightheadedness, dizziness. Taking Valsartan '20mg'$  daily. Notes her palpitations have been overall quiescent. Recently returned from a trip to [place] with her husband and 10 friends which she greatly enjoyed.   Previous antihypertensives: Propranolol - felt jittery  Past Medical History:  Diagnosis Date   Allergy    Anxiety 1990   Arthritis    Dysphonia, spasmodic    GERD (gastroesophageal reflux disease)    History of DVT (deep vein thrombosis)    JAN 2002-- S/P BREAST IMPLANTS  RIGHT UPPER ARM (AXILLARY/ SUBCLAVIN VEIN)   PMB (postmenopausal bleeding)     Past Surgical History:  Procedure Laterality Date   APPENDECTOMY  AS  CHILD    AUGMENTATION MAMMAPLASTY Bilateral    20 years ago   BILATERAL BREAST LIFT AND IMPLANT REDUCTION  12/2012   BREAST ENHANCEMENT SURGERY Bilateral 02/2000   CATARACT EXTRACTION W/ INTRAOCULAR LENS  IMPLANT, BILATERAL     COSMETIC SURGERY  1995   breast implants   DILATATION & CURRETTAGE/HYSTEROSCOPY WITH RESECTOCOPE N/A 04/18/2013   Procedure: DILATATION & CURETTAGE/HYSTEROSCOPY ;  Surgeon: [name], MD;  Location: [place];  Service: Gynecology;  Laterality: N/A;   EYE SURGERY  2015   cataracts   JOINT REPLACEMENT  2014   Knee replacement   KNEE ARTHROSCOPY W/ MENISCECTOMY Right 2011   ROTATOR CUFF REPAIR Right 2005   skin surgery     TONSILLECTOMY  AS CHILD   TOTAL KNEE ARTHROPLASTY Right 05/03/2015   Procedure: RIGHT TOTAL KNEE ARTHROPLASTY;  Surgeon: Vickey Huger, MD;  Location: [place];  Service: Orthopedics;  Laterality: Right;    Current Medications: Current Meds  Medication Sig   ALPRAZolam (XANAX) 0.5 MG tablet Take 0.5 mg by mouth daily as needed.   Biotin 5000 MCG TABS Take 1 tablet by mouth daily.   Calcium Carb-Cholecalciferol 600-800 MG-UNIT TABS Take 1 tablet by mouth 2 (two) times daily.   Cholecalciferol (VITAMIN D3) 2000 units TABS Take 1 tablet by mouth daily.   Cranberry 400 MG CAPS cranberry   escitalopram (LEXAPRO) 10 MG tablet TAKE ONE-HALF TABLET BY MOUTH ONCE DAILY FOR SEVEN DAYS AND THEN INCREASE TO ONE TABLET ONCE DAILY   estradiol (ESTRACE) 0.1 MG/GM vaginal cream Place vaginally  as needed.   fluticasone (FLONASE) 50 MCG/ACT nasal spray Place 1 spray into both nostrils daily as needed for allergies or rhinitis.   metoprolol succinate (TOPROL-XL) 25 MG 24 hr tablet Take 25 mg by mouth daily.   Probiotic Product (ALIGN) 4 MG CAPS Take 1 capsule by mouth daily.   traZODone (DESYREL) 50 MG tablet Take 1-2 tablets (50-100 mg total) by mouth at bedtime as needed for sleep.   [DISCONTINUED] valsartan (DIOVAN) 40 MG tablet Take 20 mg by  mouth daily.     Allergies:   Bee venom and Wasp venom   Social History   Socioeconomic History   Marital status: Married    Spouse name: Tamara Pope   Number of children: 2   Years of education: 14   Highest education level: Associate degree: academic program  Occupational History    Employer: [employer]   Occupation: Retired  Tobacco Use   Smoking status: Never   Smokeless tobacco: Never   Tobacco comments:    None  Vaping Use   Vaping Use: Never used  Substance and Sexual Activity   Alcohol use: Yes    Alcohol/week: 4.0 standard drinks of alcohol    Types: 4 Glasses of wine per week    Comment: 4 glasses of wine a week   Drug use: No   Sexual activity: Not Currently    Partners: Male  Other Topics Concern   Not on file  Social History Narrative   Lives with her husband. She has two daughters. She enjoys dancing and going out to different restaurants.   Social Determinants of Health   Financial Resource Strain: Low Risk  (06/24/2021)   Overall Financial Resource Strain (CARDIA)    Difficulty of Paying Living Expenses: Not hard at all  Food Insecurity: No Food Insecurity (06/20/2021)   Hunger Vital Sign    Worried About Running Out of Food in the Last Year: Never true    Ran Out of Food in the Last Year: Never true  Transportation Needs: No Transportation Needs (06/20/2021)   PRAPARE - Hydrologist (Medical): No    Lack of Transportation (Non-Medical): No  Physical Activity: Sufficiently Active (06/20/2021)   Exercise Vital Sign    Days of Exercise per Week: 3 days    Minutes of Exercise per Session: 60 min  Stress: No Stress Concern Present (06/24/2021)   [place]    Feeling of Stress : Only a little  Social Connections: Socially Integrated (06/24/2021)   Social Connection and Isolation Panel [NHANES]    Frequency of Communication with Friends and Family:  More than three times a week    Frequency of Social Gatherings with Friends and Family: Three times a week    Attends Religious Services: More than 4 times per year    Active Member of Clubs or Organizations: Yes    Attends Archivist Meetings: Patient refused    Marital Status: Married     Family History: The patient's family history includes Arthritis in her maternal grandmother and mother; Birth defects in her brother; Cancer in her father; Diabetes in her brother; Heart attack in her paternal grandmother; Heart disease in her maternal grandmother; Lung cancer in her father. There is no history of Colon cancer or Stomach cancer.  ROS:   Please see the history of present illness.     All other systems reviewed and are negative.  EKGs/Labs/Other  Studies Reviewed:    EKG:  No EKG today.   ZIO 08/2021 Quality: Fair.  Baseline artifact. Predominant rhythm:sinus rhythm Average heart rate: 82 bpm Max heart rate: 137 bpm in sinus rhythm.  176 bpm in atrial tachycardia Min heart rate: 53 bpm Pauses >2.5 seconds: none   Rare PACs and PVCs 31 episodes of atrial tachycardia up to 11 beats.  Fastest rate 176 bpm   Recent Labs: 08/30/2021: TSH 2.310 09/19/2021: ALT 19; BUN 13; Creat 0.48; Hemoglobin 13.9; Platelets 318; Potassium 4.0; Sodium 132   Recent Lipid Panel    Component Value Date/Time   CHOL 260 (H) 09/19/2021 0000   TRIG 107 09/19/2021 0000   HDL 115 09/19/2021 0000   CHOLHDL 2.3 09/19/2021 0000   VLDL 28.8 02/18/2019 1116   LDLCALC 124 (H) 09/19/2021 0000    Physical Exam:   VS:  BP 114/62   Pulse 89   Ht '5\' 3"'$  (1.6 m)   Wt 126 lb (57.2 kg)   BMI 22.32 kg/m  , BMI Body mass index is 22.32 kg/m. GENERAL:  Well appearing HEENT: Pupils equal round and reactive, fundi not visualized, oral mucosa unremarkable NECK:  No jugular venous distention, waveform within normal limits, carotid upstroke brisk and symmetric, no bruits, no thyromegaly LYMPHATICS:  No  cervical adenopathy LUNGS:  Clear to auscultation bilaterally HEART:  RRR.  PMI not displaced or sustained,S1 and S2 within normal limits, no S3, no S4, no clicks, no rubs,  murmurs ABD:  Flat, positive bowel sounds normal in frequency in pitch, no bruits, no rebound, no guarding, no midline pulsatile mass, no hepatomegaly, no splenomegaly EXT:  2 plus pulses throughout, no edema, no cyanosis no clubbing SKIN:  No rashes no nodules NEURO:  Cranial nerves II through XII grossly intact, motor grossly intact throughout PSYCH:  Cognitively intact, oriented to person place and time  ASSESSMENT/PLAN:    HTN - Diagnosed 3 years ago and BP has been labile often related to anxiety/stress. Did not tolerate Propranolol as it made her feel "jittery". Tolerating Valsartan '20mg'$  QD which we will continue. BP at goal <130/80.   Palpitations / PAT - Monitor 08/2021 with 31 episodes of atrial tachycardia (up to 11 beats, fastest rate 176 bpm). Quiescent on Metoprolol succinate '25mg'$  daily.  Anxiety - Follows with primary care.   Spasmodic dysphonia -Continue to follow with PCP.   Screening for Secondary Hypertension:     08/30/2021    5:06 PM  Causes  Drugs/Herbals Screened  Sleep Apnea Screened     - Comments No snoring.  Thyroid Disease Screened     - Comments 02/2019 TSH 1.86. Update TSH 08/30/21.  Pheochromocytoma Screened     - Comments Notes palpitations, diaphoresis. Consider urine metanephrines at follow up.  Cushing's Syndrome Screened     - Comments No cushinoid appearance.  Coarctation of the Aorta Screened     - Comments R arm BP 163/84 and L arm 144/81.  Compliance Screened    Relevant Labs/Studies:    Latest Ref Rng & Units 09/19/2021   12:00 AM 08/30/2021    3:10 PM 05/07/2020    2:44 PM  Basic Labs  Sodium 135 - 146 mmol/L 132  134  132   Potassium 3.5 - 5.3 mmol/L 4.0  5.1  4.2   Creatinine 0.60 - 1.00 mg/dL 0.48  0.49  0.48        Latest Ref Rng & Units 08/30/2021    3:10 PM  02/18/2019  11:16 AM  Thyroid   TSH 0.450 - 4.500 uIU/mL 2.310  1.86      Disposition:    FU with Dr. Oval Linsey or Loel Dubonnet, NP  in 6 months   Medication Adjustments/Labs and Tests Ordered: Current medicines are reviewed at length with the patient today.  Concerns regarding medicines are outlined above.  No orders of the defined types were placed in this encounter.  Meds ordered this encounter  Medications   valsartan (DIOVAN) 40 MG tablet    Sig: Take 0.5 tablets (20 mg total) by mouth daily.    Dispense:  45 tablet    Refill:  3    Order Specific Question:   Supervising Provider    Answer:   Buford Dresser [[id number]]   Signed, Loel Dubonnet, NP  01/02/2022 4:17 PM    [place]

## 2022-01-04 DIAGNOSIS — H04123 Dry eye syndrome of bilateral lacrimal glands: Secondary | ICD-10-CM | POA: Diagnosis not present

## 2022-01-04 DIAGNOSIS — H02885 Meibomian gland dysfunction left lower eyelid: Secondary | ICD-10-CM | POA: Diagnosis not present

## 2022-01-04 DIAGNOSIS — H02882 Meibomian gland dysfunction right lower eyelid: Secondary | ICD-10-CM | POA: Diagnosis not present

## 2022-01-05 DIAGNOSIS — Z23 Encounter for immunization: Secondary | ICD-10-CM | POA: Diagnosis not present

## 2022-01-12 DIAGNOSIS — H02882 Meibomian gland dysfunction right lower eyelid: Secondary | ICD-10-CM | POA: Diagnosis not present

## 2022-01-12 DIAGNOSIS — H04123 Dry eye syndrome of bilateral lacrimal glands: Secondary | ICD-10-CM | POA: Diagnosis not present

## 2022-01-12 DIAGNOSIS — H02885 Meibomian gland dysfunction left lower eyelid: Secondary | ICD-10-CM | POA: Diagnosis not present

## 2022-02-18 ENCOUNTER — Other Ambulatory Visit: Payer: Self-pay | Admitting: Family Medicine

## 2022-03-07 ENCOUNTER — Other Ambulatory Visit: Payer: Self-pay

## 2022-03-07 MED ORDER — ESCITALOPRAM OXALATE 10 MG PO TABS: ORAL_TABLET | ORAL | 1 refills | Status: DC

## 2022-03-13 DIAGNOSIS — Z6823 Body mass index (BMI) 23.0-23.9, adult: Secondary | ICD-10-CM | POA: Diagnosis not present

## 2022-03-13 DIAGNOSIS — Z01419 Encounter for gynecological examination (general) (routine) without abnormal findings: Secondary | ICD-10-CM | POA: Diagnosis not present

## 2022-03-13 DIAGNOSIS — N959 Unspecified menopausal and perimenopausal disorder: Secondary | ICD-10-CM | POA: Diagnosis not present

## 2022-05-01 DIAGNOSIS — H01005 Unspecified blepharitis left lower eyelid: Secondary | ICD-10-CM | POA: Diagnosis not present

## 2022-05-01 DIAGNOSIS — H01002 Unspecified blepharitis right lower eyelid: Secondary | ICD-10-CM | POA: Diagnosis not present

## 2022-05-09 ENCOUNTER — Encounter: Payer: Self-pay | Admitting: Family Medicine

## 2022-05-09 ENCOUNTER — Ambulatory Visit (INDEPENDENT_AMBULATORY_CARE_PROVIDER_SITE_OTHER): Payer: Medicare Other | Admitting: Family Medicine

## 2022-05-09 VITALS — BP 148/80 | HR 71 | Ht 63.0 in | Wt 128.0 lb

## 2022-05-09 DIAGNOSIS — I10 Essential (primary) hypertension: Secondary | ICD-10-CM

## 2022-05-09 DIAGNOSIS — F411 Generalized anxiety disorder: Secondary | ICD-10-CM | POA: Diagnosis not present

## 2022-05-09 MED ORDER — ESCITALOPRAM OXALATE 10 MG PO TABS: 15.0000 mg | ORAL_TABLET | Freq: Every day | ORAL | 1 refills | Status: DC

## 2022-05-09 MED ORDER — BUSPIRONE HCL 10 MG PO TABS: 10.0000 mg | ORAL_TABLET | Freq: Two times a day (BID) | ORAL | 1 refills | Status: DC

## 2022-05-09 NOTE — Assessment & Plan Note (Signed)
She continues to have difficulty with anxiety.  Increasing Lexapro to 15 mg daily.  Adding BuSpar 10 mg twice daily.  Will continue to wean back from alprazolam.  Encouraged to not use more than 1/day.

## 2022-05-09 NOTE — Assessment & Plan Note (Signed)
Blood pressure mildly elevated.  She will continue valsartan at current strength.  Continues to see Dr. Oval Linsey

## 2022-05-09 NOTE — Progress Notes (Signed)
Tamara Pope - 77 y.o. female MRN [id number]  Date of birth: 1945/08/20  Subjective Chief Complaint  Patient presents with   Anxiety    HPI Tamara Pope is a 77 y.o. female here today for follow up visit.   She is following up for anxiety.  She has been prescribed lexapro for anxiety with continuation of alprazolam at reduced frequency.  She is also taking trazodone to help with sleep.  She reports that she continues to have increased anxiety.  He goal remains to get off of alprazolam.  She is tolerating Lexapro at current strength.  Lexapro has helped since adding this.  She is sleeping pretty well with trazodone although does wake up easily in the early morning.  She did see a therapist but appears that she was only seen once.  Blood pressure is elevated today.  She remains on valsartan 40 mg daily.  She is seeing Dr. Oval Linsey at advanced hypertension clinic.  Denies chest pain, shortness of breath, palpitations, headaches or vision changes.  ROS:  A comprehensive ROS was completed and negative except as noted per HPI  Allergies  Allergen Reactions   Bee Venom Anaphylaxis   Wasp Venom Anaphylaxis    Past Medical History:  Diagnosis Date   Allergy    Anxiety 1990   Arthritis    Dysphonia, spasmodic    GERD (gastroesophageal reflux disease)    History of DVT (deep vein thrombosis)    JAN 2002-- S/P BREAST IMPLANTS  RIGHT UPPER ARM (AXILLARY/ SUBCLAVIN VEIN)   PMB (postmenopausal bleeding)     Past Surgical History:  Procedure Laterality Date   APPENDECTOMY  AS  CHILD   AUGMENTATION MAMMAPLASTY Bilateral    20 years ago   BILATERAL BREAST LIFT AND IMPLANT REDUCTION  12/2012   BREAST ENHANCEMENT SURGERY Bilateral 02/2000   CATARACT EXTRACTION W/ INTRAOCULAR LENS  IMPLANT, BILATERAL     COSMETIC SURGERY  1995   breast implants   DILATATION & CURRETTAGE/HYSTEROSCOPY WITH RESECTOCOPE N/A 04/18/2013   Procedure: DILATATION & CURETTAGE/HYSTEROSCOPY ;   Surgeon: [name], MD;  Location: [place];  Service: Gynecology;  Laterality: N/A;   EYE SURGERY  2015   cataracts   JOINT REPLACEMENT  2014   Knee replacement   KNEE ARTHROSCOPY W/ MENISCECTOMY Right 2011   ROTATOR CUFF REPAIR Right 2005   skin surgery     TONSILLECTOMY  AS CHILD   TOTAL KNEE ARTHROPLASTY Right 05/03/2015   Procedure: RIGHT TOTAL KNEE ARTHROPLASTY;  Surgeon: Vickey Huger, MD;  Location: [place];  Service: Orthopedics;  Laterality: Right;    Social History   Socioeconomic History   Marital status: Married    Spouse name: Tamara Pope   Number of children: 2   Years of education: 14   Highest education level: Associate degree: academic program  Occupational History    Employer: [employer].   Occupation: Retired  Tobacco Use   Smoking status: Never   Smokeless tobacco: Never   Tobacco comments:    None  Vaping Use   Vaping Use: Never used  Substance and Sexual Activity   Alcohol use: Yes    Alcohol/week: 4.0 standard drinks of alcohol    Types: 4 Glasses of wine per week    Comment: 4 glasses of wine a week   Drug use: No   Sexual activity: Not Currently    Partners: Male  Other Topics Concern   Not on file  Social History Narrative  Lives with her husband. She has two daughters. She enjoys dancing and going out to different restaurants.   Social Determinants of Health   Financial Resource Strain: Low Risk  (06/24/2021)   Overall Financial Resource Strain (CARDIA)    Difficulty of Paying Living Expenses: Not hard at all  Food Insecurity: No Food Insecurity (06/20/2021)   Hunger Vital Sign    Worried About Running Out of Food in the Last Year: Never true    Ran Out of Food in the Last Year: Never true  Transportation Needs: No Transportation Needs (06/20/2021)   PRAPARE - Hydrologist (Medical): No    Lack of Transportation (Non-Medical): No  Physical Activity: Sufficiently Active  (06/20/2021)   Exercise Vital Sign    Days of Exercise per Week: 3 days    Minutes of Exercise per Session: 60 min  Stress: No Stress Concern Present (06/24/2021)   [place]    Feeling of Stress : Only a little  Social Connections: Socially Integrated (06/24/2021)   Social Connection and Isolation Panel [NHANES]    Frequency of Communication with Friends and Family: More than three times a week    Frequency of Social Gatherings with Friends and Family: Three times a week    Attends Religious Services: More than 4 times per year    Active Member of Clubs or Organizations: Yes    Attends Archivist Meetings: Patient refused    Marital Status: Married    Family History  Problem Relation Age of Onset   Arthritis Mother    Lung cancer Father    Cancer Father    Diabetes Brother        age 45   Birth defects Brother    Arthritis Maternal Grandmother    Heart disease Maternal Grandmother    Heart attack Paternal Grandmother    Colon cancer Neg Hx    Stomach cancer Neg Hx     Health Maintenance  Topic Date Due   DTaP/Tdap/Td (2 - Td or Tdap) 11/04/2019   Medicare Annual Wellness (AWV)  06/25/2022   COVID-19 Vaccine (4 - 2023-24 season) 05/26/2023 (Originally 12/02/2021)   Pneumonia Vaccine 32+ Years old  Completed   INFLUENZA VACCINE  Completed   DEXA SCAN  Completed   Hepatitis C Screening  Completed   Zoster Vaccines- Shingrix  Completed   HPV VACCINES  Aged Out   COLONOSCOPY (Pts 45-68yr Insurance coverage will need to be confirmed)  Discontinued     ----------------------------------------------------------------------------------------------------------------------------------------------------------------------------------------------------------------- Physical Exam BP (!) 148/80 (BP Location: Right Arm, Patient Position: Sitting, Cuff Size: Small)   Pulse 71   Ht '5\' 3"'$  (1.6 m)   Wt 128 lb  (58.1 kg)   SpO2 98%   BMI 22.67 kg/m   Physical Exam Constitutional:      Appearance: Normal appearance.  HENT:     Head: Normocephalic and atraumatic.  Eyes:     General: No scleral icterus. Cardiovascular:     Rate and Rhythm: Normal rate and regular rhythm.  Pulmonary:     Effort: Pulmonary effort is normal.     Breath sounds: Normal breath sounds.  Musculoskeletal:     Cervical back: Neck supple.  Neurological:     Mental Status: She is alert.  Psychiatric:        Mood and Affect: Mood normal.        Behavior: Behavior normal.     ------------------------------------------------------------------------------------------------------------------------------------------------------------------------------------------------------------------- Assessment  and Plan  Essential hypertension Blood pressure mildly elevated.  She will continue valsartan at current strength.  Continues to see Dr. Oval Linsey  GAD (generalized anxiety disorder) She continues to have difficulty with anxiety.  Increasing Lexapro to 15 mg daily.  Adding BuSpar 10 mg twice daily.  Will continue to wean back from alprazolam.  Encouraged to not use more than 1/day.   Meds ordered this encounter  Medications   escitalopram (LEXAPRO) 10 MG tablet    Sig: Take 1.5 tablets (15 mg total) by mouth at bedtime.    Dispense:  135 tablet    Refill:  1   busPIRone (BUSPAR) 10 MG tablet    Sig: Take 1 tablet (10 mg total) by mouth 2 (two) times daily.    Dispense:  180 tablet    Refill:  1    Return in about 5 weeks (around 06/13/2022) for Anxiety.    This visit occurred during the SARS-CoV-2 public health emergency.  Safety protocols were in place, including screening questions prior to the visit, additional usage of staff PPE, and extensive cleaning of exam room while observing appropriate contact time as indicated for disinfecting solutions.

## 2022-05-09 NOTE — Patient Instructions (Addendum)
Increase lexapro to '15mg'$  (1.5 tablets) daily.  Add buspar '10mg'$  twice daily.  Try to limit alprazolam (xanax) to 0.'5mg'$  once per day.  Follow up with me in 4-6 weeks.   Try using a pillow between your knees. If symptoms persist we'll check labs at your follow up visit.

## 2022-05-10 ENCOUNTER — Telehealth: Payer: Self-pay | Admitting: Cardiovascular Disease

## 2022-05-10 NOTE — Telephone Encounter (Signed)
Patient is calling to get advice in regards to panoramic xray done at dental office. Requesting return call.

## 2022-05-10 NOTE — Telephone Encounter (Signed)
Spoke with patient who recently saw her dentist Was told there was calcification on hybrid bone and suggested following up with her cardiologist  Does have some times where she feels she has to stop to get her breath but denies any chest pains  Scheduled patient to see Dr Oval Linsey 2/22 Patient aware of date, time, and location

## 2022-05-17 ENCOUNTER — Other Ambulatory Visit: Payer: Self-pay | Admitting: Family Medicine

## 2022-05-25 ENCOUNTER — Ambulatory Visit (HOSPITAL_BASED_OUTPATIENT_CLINIC_OR_DEPARTMENT_OTHER): Payer: Medicare Other | Admitting: Cardiovascular Disease

## 2022-05-25 NOTE — Progress Notes (Incomplete)
Advanced Hypertension Clinic Follow-up:    Date:  05/25/2022   ID:  Tamara Pope, Tamara Pope 28-Dec-1945, MRN [id number]  PCP:  Luetta Nutting, DO  Cardiologist:  None  Nephrologist:  Referring MD: Luetta Nutting, DO   CC: Hypertension  History of Present Illness:    Tamara Pope is a 77 y.o. female with a hx of hypertension, hyperlipidemia, palpitations, and anxiety, here for follow-up of hypertension. She saw her PCP earlier this month and blood pressure was 148/80 on losartan. They had been working on management of her anxiety. She was initially seen in the [facility] by Laurann Montana, NP 08/2021 noting labile blood pressures associated with stress. She wore a monitor that showed 31 episodes of atrial tachycardia lasting up to 11 beats. Labs were unremarkable. She was started on metoprolol and saw EP who felt that her arrhythmias were due to anxiety. She had jitteriness with propranolol.  Today,  She denies any palpitations, chest pain, shortness of breath, or peripheral edema. No lightheadedness, headaches, syncope, orthopnea, or PND.  (+)  ***Plan: -  Previous antihypertensives: Metoprolol Diltiazem Propranolol - jitteriness  Past Medical History:  Diagnosis Date   Allergy    Anxiety 1990   Arthritis    Dysphonia, spasmodic    GERD (gastroesophageal reflux disease)    History of DVT (deep vein thrombosis)    JAN 2002-- S/P BREAST IMPLANTS  RIGHT UPPER ARM (AXILLARY/ SUBCLAVIN VEIN)   PMB (postmenopausal bleeding)     Past Surgical History:  Procedure Laterality Date   APPENDECTOMY  AS  CHILD   AUGMENTATION MAMMAPLASTY Bilateral    20 years ago   BILATERAL BREAST LIFT AND IMPLANT REDUCTION  12/2012   BREAST ENHANCEMENT SURGERY Bilateral 02/2000   CATARACT EXTRACTION W/ INTRAOCULAR LENS  IMPLANT, BILATERAL     COSMETIC SURGERY  1995   breast implants   DILATATION & CURRETTAGE/HYSTEROSCOPY WITH RESECTOCOPE N/A 04/18/2013    Procedure: DILATATION & CURETTAGE/HYSTEROSCOPY ;  Surgeon: [name], MD;  Location: [place];  Service: Gynecology;  Laterality: N/A;   EYE SURGERY  2015   cataracts   JOINT REPLACEMENT  2014   Knee replacement   KNEE ARTHROSCOPY W/ MENISCECTOMY Right 2011   ROTATOR CUFF REPAIR Right 2005   skin surgery     TONSILLECTOMY  AS CHILD   TOTAL KNEE ARTHROPLASTY Right 05/03/2015   Procedure: RIGHT TOTAL KNEE ARTHROPLASTY;  Surgeon: Vickey Huger, MD;  Location: [place];  Service: Orthopedics;  Laterality: Right;    Current Medications: No outpatient medications have been marked as taking for the 05/25/22 encounter (Appointment) with Skeet Latch, MD.     Allergies:   Bee venom and Wasp venom   Social History   Socioeconomic History   Marital status: Married    Spouse name: Irielle Berglin   Number of children: 2   Years of education: 14   Highest education level: Associate degree: academic program  Occupational History    Employer: [employer].   Occupation: Retired  Tobacco Use   Smoking status: Never   Smokeless tobacco: Never   Tobacco comments:    None  Vaping Use   Vaping Use: Never used  Substance and Sexual Activity   Alcohol use: Yes    Alcohol/week: 4.0 standard drinks of alcohol    Types: 4 Glasses of wine per week    Comment: 4 glasses of wine a week   Drug use: No   Sexual activity: Not Currently  Partners: Male  Other Topics Concern   Not on file  Social History Narrative   Lives with her husband. She has two daughters. She enjoys dancing and going out to different restaurants.   Social Determinants of Health   Financial Resource Strain: Low Risk  (06/24/2021)   Overall Financial Resource Strain (CARDIA)    Difficulty of Paying Living Expenses: Not hard at all  Food Insecurity: No Food Insecurity (06/20/2021)   Hunger Vital Sign    Worried About Running Out of Food in the Last Year: Never true    Ran Out of Food in the  Last Year: Never true  Transportation Needs: No Transportation Needs (06/20/2021)   PRAPARE - Hydrologist (Medical): No    Lack of Transportation (Non-Medical): No  Physical Activity: Sufficiently Active (06/20/2021)   Exercise Vital Sign    Days of Exercise per Week: 3 days    Minutes of Exercise per Session: 60 min  Stress: No Stress Concern Present (06/24/2021)   [place]    Feeling of Stress : Only a little  Social Connections: Socially Integrated (06/24/2021)   Social Connection and Isolation Panel [NHANES]    Frequency of Communication with Friends and Family: More than three times a week    Frequency of Social Gatherings with Friends and Family: Three times a week    Attends Religious Services: More than 4 times per year    Active Member of Clubs or Organizations: Yes    Attends Archivist Meetings: Patient refused    Marital Status: Married     Family History: The patient's family history includes Arthritis in her maternal grandmother and mother; Birth defects in her brother; Cancer in her father; Diabetes in her brother; Heart attack in her paternal grandmother; Heart disease in her maternal grandmother; Lung cancer in her father. There is no history of Colon cancer or Stomach cancer.  ROS:   Please see the history of present illness.     All other systems reviewed and are negative.  EKGs/Labs/Other Studies Reviewed:    Monitor  09/2021: 12 Day Zio Monitor   Quality: Fair.  Baseline artifact. Predominant rhythm:sinus rhythm Average heart rate: 82 bpm Max heart rate: 137 bpm in sinus rhythm.  176 bpm in atrial tachycardia Min heart rate: 53 bpm Pauses >2.5 seconds: none   Rare PACs and PVCs 31 episodes of atrial tachycardia up to 11 beats.  Fastest rate 176 bpm   EKG:  EKG is personally reviewed. 05/25/2022: Sinus ***. Rate *** bpm.  Recent Labs: 08/30/2021: TSH  2.310 09/19/2021: ALT 19; BUN 13; Creat 0.48; Hemoglobin 13.9; Platelets 318; Potassium 4.0; Sodium 132   Recent Lipid Panel    Component Value Date/Time   CHOL 260 (H) 09/19/2021 0000   TRIG 107 09/19/2021 0000   HDL 115 09/19/2021 0000   CHOLHDL 2.3 09/19/2021 0000   VLDL 28.8 02/18/2019 1116   LDLCALC 124 (H) 09/19/2021 0000    Physical Exam:    VS:  There were no vitals taken for this visit. , BMI There is no height or weight on file to calculate BMI. GENERAL:  Well appearing HEENT: Pupils equal round and reactive, fundi not visualized, oral mucosa unremarkable NECK:  No jugular venous distention, waveform within normal limits, carotid upstroke brisk and symmetric, no bruits, no thyromegaly LYMPHATICS:  No cervical adenopathy LUNGS:  Clear to auscultation bilaterally HEART:  RRR.  PMI not displaced  or sustained,S1 and S2 within normal limits, no S3, no S4, no clicks, no rubs, *** murmurs ABD:  Flat, positive bowel sounds normal in frequency in pitch, no bruits, no rebound, no guarding, no midline pulsatile mass, no hepatomegaly, no splenomegaly EXT:  2 plus pulses throughout, no edema, no cyanosis, no clubbing SKIN:  No rashes, no nodules NEURO:  Cranial nerves II through XII grossly intact, motor grossly intact throughout PSYCH:  Cognitively intact, oriented to person place and time   ASSESSMENT/PLAN:    No problem-specific Assessment & Plan notes found for this encounter.   Screening for Secondary Hypertension: { Click here to document screening for secondary causes of HTN  :[id number]    08/30/2021    5:06 PM  Causes  Drugs/Herbals Screened  Sleep Apnea Screened     - Comments No snoring.  Thyroid Disease Screened     - Comments 02/2019 TSH 1.86. Update TSH 08/30/21.  Pheochromocytoma Screened     - Comments Notes palpitations, diaphoresis. Consider urine metanephrines at follow up.  Cushing's Syndrome Screened     - Comments No cushinoid appearance.  Coarctation  of the Aorta Screened     - Comments R arm BP 163/84 and L arm 144/81.  Compliance Screened    Relevant Labs/Studies:    Latest Ref Rng & Units 09/19/2021   12:00 AM 08/30/2021    3:10 PM 05/07/2020    2:44 PM  Basic Labs  Sodium 135 - 146 mmol/L 132  134  132   Potassium 3.5 - 5.3 mmol/L 4.0  5.1  4.2   Creatinine 0.60 - 1.00 mg/dL 0.48  0.49  0.48        Latest Ref Rng & Units 08/30/2021    3:10 PM 02/18/2019   11:16 AM  Thyroid   TSH 0.450 - 4.500 uIU/mL 2.310  1.86                     she consents to be monitored in our remote patient monitoring program through [facility].  she will track his blood pressure twice daily and understands that these trends will help [place] to adjust her medications as needed prior to his next appointment.  she *** interested in enrolling in the PREP exercise and nutrition program through the [facility].     Disposition:    FU with APP/PharmD in 1 month for the next 3 months.   FU with Tiffany C. Oval Linsey, MD, [facility] in 4 months.  Medication Adjustments/Labs and Tests Ordered: Current medicines are reviewed at length with the patient today.  Concerns regarding medicines are outlined above.   No orders of the defined types were placed in this encounter.  No orders of the defined types were placed in this encounter.   I,Mathew Stumpf,acting as a Education administrator for Skeet Latch, MD.,have documented all relevant documentation on the behalf of Skeet Latch, MD,as directed by  Skeet Latch, MD while in the presence of Skeet Latch, MD.  ***  Signed, Madelin Rear  05/25/2022 10:08 AM    [place]

## 2022-05-26 ENCOUNTER — Ambulatory Visit (INDEPENDENT_AMBULATORY_CARE_PROVIDER_SITE_OTHER): Payer: Medicare Other | Admitting: Family Medicine

## 2022-05-26 ENCOUNTER — Ambulatory Visit (INDEPENDENT_AMBULATORY_CARE_PROVIDER_SITE_OTHER): Payer: Medicare Other | Admitting: Family

## 2022-05-26 ENCOUNTER — Encounter (HOSPITAL_BASED_OUTPATIENT_CLINIC_OR_DEPARTMENT_OTHER): Payer: Self-pay | Admitting: Family

## 2022-05-26 ENCOUNTER — Encounter: Payer: Self-pay | Admitting: Family Medicine

## 2022-05-26 ENCOUNTER — Ambulatory Visit (INDEPENDENT_AMBULATORY_CARE_PROVIDER_SITE_OTHER): Payer: Medicare Other

## 2022-05-26 VITALS — BP 152/75 | HR 72 | Ht 63.0 in | Wt 128.0 lb

## 2022-05-26 VITALS — BP 110/70 | HR 77 | Ht 63.0 in | Wt 128.0 lb

## 2022-05-26 DIAGNOSIS — R030 Elevated blood-pressure reading, without diagnosis of hypertension: Secondary | ICD-10-CM | POA: Diagnosis not present

## 2022-05-26 DIAGNOSIS — F411 Generalized anxiety disorder: Secondary | ICD-10-CM | POA: Diagnosis not present

## 2022-05-26 DIAGNOSIS — I10 Essential (primary) hypertension: Secondary | ICD-10-CM

## 2022-05-26 DIAGNOSIS — J383 Other diseases of vocal cords: Secondary | ICD-10-CM | POA: Diagnosis not present

## 2022-05-26 DIAGNOSIS — R42 Dizziness and giddiness: Secondary | ICD-10-CM

## 2022-05-26 DIAGNOSIS — R61 Generalized hyperhidrosis: Secondary | ICD-10-CM

## 2022-05-26 DIAGNOSIS — I4719 Other supraventricular tachycardia: Secondary | ICD-10-CM | POA: Diagnosis not present

## 2022-05-26 DIAGNOSIS — R002 Palpitations: Secondary | ICD-10-CM

## 2022-05-26 DIAGNOSIS — R0989 Other specified symptoms and signs involving the circulatory and respiratory systems: Secondary | ICD-10-CM

## 2022-05-26 DIAGNOSIS — F419 Anxiety disorder, unspecified: Secondary | ICD-10-CM | POA: Diagnosis not present

## 2022-05-26 DIAGNOSIS — R209 Unspecified disturbances of skin sensation: Secondary | ICD-10-CM

## 2022-05-26 NOTE — Patient Instructions (Addendum)
Increase lexapro (escitalopram) to 1.5 tablets each day See me again in 8 weeks.

## 2022-05-26 NOTE — Progress Notes (Signed)
Advanced Hypertension Assessment:    Date:  05/26/2022   ID:  Tamara Pope [birth year], MRN [id number]  PCP:  Tamara Nutting, DO  Cardiologist:  None  Nephrologist:  Referring MD: Tamara Nutting, DO   CC: Hypertension  History of Present Illness:    Tamara Pope is a 77 y.o. female with a hx of hypertension, anxiety, arthritis, GERD, DVT (04/2020 in the setting of breast implant), spasmodic dysphonia here for follow up of hypertension. Her maternal grandmother had heart attack and CAD. Last seen 01/02/22  [facility] with [facility] 08/30/21. She was diagnosed with hypertension three years prior with BP labile based on stress. PCP had trialed Propranolol for BP and anxiety benefit but she did not tolerate. Limited salt in diet and stayed active with personal trainer, stretch exercises, walking. Did not tolerate Propranolol as felt 'jittery' and was started on Valsartan. 14 day ZIO placed due to palpations revealed NSR, 31 episodes of atrial tachycardia up to 11 beats, rare PVC/PAC. BMP, TSH were unremarkable. She was started on Metoprolol and had visit with the EP who felt her rare PVC/PACs were predominantly related to anxiety. Valsartan was later reduced to '20mg'$  daily due to lightheadedness.   She presents today for follow up with her husband. Notes feeling lightheaded with dizziness on going for a couple of weeks. Has been taking a whole tablet of Valsartan instead of half tablet inadvertently and unsure when she made this change.   She notes her bilateral lower extremities have been tingling particularly at night. It is worse at night. Notes her feet are "ice cold". No pain with ambulation. She tried heat which helped some. No swelling to her lower extremities. Notes her toes in the evening will look much lighter.   She notes she is anxious regarding workup with her dentist. Calcifications noted on hypoid bone on panoramic xray. Denies amaurosis  fugax.  She notes she is "yawning" or taking a deep breath in the evening - discussed likely her body resetting her CO2 and O2 levels. She continues to exercise without dyspnea.   Notes her blood pressure has been labile at home with readings as low as 92/43. Two hours later was 152/74. Has been worried by blood pressure swings.   Previous antihypertensives: Propranolol - felt jittery  Past Medical History:  Diagnosis Date   Allergy    Anxiety 1990   Arthritis    Dysphonia, spasmodic    GERD (gastroesophageal reflux disease)    History of DVT (deep vein thrombosis)    JAN 2002-- S/P BREAST IMPLANTS  RIGHT UPPER ARM (AXILLARY/ SUBCLAVIN VEIN)   PMB (postmenopausal bleeding)     Past Surgical History:  Procedure Laterality Date   APPENDECTOMY  AS  CHILD   AUGMENTATION MAMMAPLASTY Bilateral    20 years ago   BILATERAL BREAST LIFT AND IMPLANT REDUCTION  12/2012   BREAST ENHANCEMENT SURGERY Bilateral 02/2000   CATARACT EXTRACTION W/ INTRAOCULAR LENS  IMPLANT, BILATERAL     COSMETIC SURGERY  1995   breast implants   DILATATION & CURRETTAGE/HYSTEROSCOPY WITH RESECTOCOPE N/A 04/18/2013   Procedure: DILATATION & CURETTAGE/HYSTEROSCOPY ;  Surgeon: [name], MD;  Location: [facility];  Service: Gynecology;  Laterality: N/A;   EYE SURGERY  2015   cataracts   JOINT REPLACEMENT  2014   Knee replacement   KNEE ARTHROSCOPY W/ MENISCECTOMY Right 2011   ROTATOR CUFF REPAIR Right 2005   skin surgery  TONSILLECTOMY  AS CHILD   TOTAL KNEE ARTHROPLASTY Right 05/03/2015   Procedure: RIGHT TOTAL KNEE ARTHROPLASTY;  Surgeon: Vickey Huger, MD;  Location: [place];  Service: Orthopedics;  Laterality: Right;    Current Medications: Current Meds  Medication Sig   ALPRAZolam (XANAX) 0.5 MG tablet Take 0.5 mg by mouth daily as needed.   Biotin 5000 MCG TABS Take 1 tablet by mouth daily.   busPIRone (BUSPAR) 10 MG tablet Take 1 tablet (10 mg total) by mouth 2 (two) times  daily.   Calcium Carb-Cholecalciferol 600-800 MG-UNIT TABS Take 1 tablet by mouth 2 (two) times daily.   Cholecalciferol (VITAMIN D3) 2000 units TABS Take 1 tablet by mouth daily.   escitalopram (LEXAPRO) 10 MG tablet Take 1.5 tablets (15 mg total) by mouth at bedtime.   estradiol (ESTRACE) 0.1 MG/GM vaginal cream Place vaginally as needed.   fluticasone (FLONASE) 50 MCG/ACT nasal spray Place 1 spray into both nostrils daily as needed for allergies or rhinitis.   metoprolol succinate (TOPROL-XL) 25 MG 24 hr tablet Take 25 mg by mouth daily.   Probiotic Product (ALIGN) 4 MG CAPS Take 1 capsule by mouth daily.   traZODone (DESYREL) 50 MG tablet TAKE ONE TO TWO TABLETS BY MOUTH EVERY NIGHT AT BEDTIME AS NEEDED FOR SLEEP   valsartan (DIOVAN) 40 MG tablet Take 0.5 tablets (20 mg total) by mouth daily.     Allergies:   Bee venom and Wasp venom   Social History   Socioeconomic History   Marital status: Married    Spouse name: Tamara Pope   Number of children: 2   Years of education: 14   Highest education level: Associate degree: academic program  Occupational History    Employer: [employer].   Occupation: Retired  Tobacco Use   Smoking status: Never   Smokeless tobacco: Never   Tobacco comments:    None  Vaping Use   Vaping Use: Never used  Substance and Sexual Activity   Alcohol use: Yes    Alcohol/week: 4.0 standard drinks of alcohol    Types: 4 Glasses of wine per week    Comment: 4 glasses of wine a week   Drug use: No   Sexual activity: Not Currently    Partners: Male  Other Topics Concern   Not on file  Social History Narrative   Lives with her husband. She has two daughters. She enjoys dancing and going out to different restaurants.   Social Determinants of Health   Financial Resource Strain: Low Risk  (06/24/2021)   Overall Financial Resource Strain (CARDIA)    Difficulty of Paying Living Expenses: Not hard at all  Food Insecurity: No Food Insecurity  (06/20/2021)   Hunger Vital Sign    Worried About Running Out of Food in the Last Year: Never true    Ran Out of Food in the Last Year: Never true  Transportation Needs: No Transportation Needs (06/20/2021)   PRAPARE - Hydrologist (Medical): No    Lack of Transportation (Non-Medical): No  Physical Activity: Sufficiently Active (06/20/2021)   Exercise Vital Sign    Days of Exercise per Week: 3 days    Minutes of Exercise per Session: 60 min  Stress: No Stress Concern Present (06/24/2021)   [place]    Feeling of Stress : Only a little  Social Connections: Socially Integrated (06/24/2021)   Social Connection and Isolation Panel [NHANES]  Frequency of Communication with Friends and Family: More than three times a week    Frequency of Social Gatherings with Friends and Family: Three times a week    Attends Religious Services: More than 4 times per year    Active Member of Clubs or Organizations: Yes    Attends Archivist Meetings: Patient refused    Marital Status: Married     Family History: The patient's family history includes Arthritis in her maternal grandmother and mother; Birth defects in her brother; Cancer in her father; Diabetes in her brother; Heart attack in her paternal grandmother; Heart disease in her maternal grandmother; Lung cancer in her father. There is no history of Colon cancer or Stomach cancer.  ROS:   Please see the history of present illness.     All other systems reviewed and are negative.  EKGs/Labs/Other Studies Reviewed:    EKG:  No EKG today.   ZIO 08/2021 Quality: Fair.  Baseline artifact. Predominant rhythm:sinus rhythm Average heart rate: 82 bpm Max heart rate: 137 bpm in sinus rhythm.  176 bpm in atrial tachycardia Min heart rate: 53 bpm Pauses >2.5 seconds: none   Rare PACs and PVCs 31 episodes of atrial tachycardia up to 11 beats.   Fastest rate 176 bpm   Recent Labs: 08/30/2021: TSH 2.310 09/19/2021: ALT 19; BUN 13; Creat 0.48; Hemoglobin 13.9; Platelets 318; Potassium 4.0; Sodium 132   Recent Lipid Panel    Component Value Date/Time   CHOL 260 (H) 09/19/2021 0000   TRIG 107 09/19/2021 0000   HDL 115 09/19/2021 0000   CHOLHDL 2.3 09/19/2021 0000   VLDL 28.8 02/18/2019 1116   LDLCALC 124 (H) 09/19/2021 0000    Physical Exam:   VS:  BP 110/70   Pulse 77   Ht '5\' 3"'$  (1.6 m)   Wt 128 lb (58.1 kg)   BMI 22.67 kg/m  , BMI Body mass index is 22.67 kg/m. GENERAL:  Well appearing HEENT: Pupils equal round and reactive, fundi not visualized, oral mucosa unremarkable NECK:  No jugular venous distention, waveform within normal limits, carotid upstroke brisk and symmetric, no bruits, no thyromegaly LYMPHATICS:  No cervical adenopathy LUNGS:  Clear to auscultation bilaterally HEART:  RRR.  PMI not displaced or sustained,S1 and S2 within normal limits, no S3, no S4, no clicks, no rubs,  murmurs ABD:  Flat, positive bowel sounds normal in frequency in pitch, no bruits, no rebound, no guarding, no midline pulsatile mass, no hepatomegaly, no splenomegaly EXT:  2 plus pulses throughout, no edema, no cyanosis no clubbing SKIN:  No rashes no nodules NEURO:  Cranial nerves II through XII grossly intact, motor grossly intact throughout PSYCH:  Cognitively intact, oriented to person place and time  ASSESSMENT/PLAN:    Lightheadedness / Dizziness - 2 week history of lightheadedness and dizziness.  Note sometimes feels lightheaded and sometimes feels as if she is spinning.  Encouraged to trial OTC meclizine 25 mg TID PRN.  She has had some hypotensive readings at home and has inadvertently been taking a whole tablet of valsartan instead of half, she will again reduce to valsartan 20 mg daily. No hematuria, melena suggestive of bleeding. Plan for carotid duplex as dentist noted calcifications (no bruit on exam, no amaurosis fugax). If  stenosis noted, will plan to add statin.   Leg numbness - Bilateral LE numbness with cold toes. Worse at night.  Strong 2+ DP pulses with no evidence of PAD. Reports no claudication. No indication for ABI.  Consider etiology venous insufficiency for cold toes. Consider etiology neuropathy vs RLS for leg numbness, encouraged to discuss with PCP.  HTN - Diagnosed 3 years ago and BP has been labile often related to anxiety/stress. Did not tolerate Propranolol as it made her feel "jittery".  Valsartan previously reduced to 20 mg daily but has inadvertently been taking 40 mg for unknown amount of time - reduce Valsartan to '20mg'$  QD. Will order 24h BP monitor for better assessment.   Palpitations / PAT - Monitor 08/2021 with 31 episodes of atrial tachycardia (up to 11 beats, fastest rate 176 bpm). Quiescent on Metoprolol succinate '25mg'$  daily.  Anxiety - Follows with primary care. Presently on Bumex '10mg'$  BID, Lexapro '15mg'$  QHS. She has PRN Xanax and is very motivated to be off of this medication. She is only taking a half tablet sparingly. Encouraged her to discuss whether Buspar or Lexapro could be increased with primary care. Also reassured her that Xanax is a tool and she is using appropriately.   Spasmodic dysphonia -Continue to follow with PCP.   Screening for Secondary Hypertension:     08/30/2021    5:06 PM  Causes  Drugs/Herbals Screened  Sleep Apnea Screened     - Comments No snoring.  Thyroid Disease Screened     - Comments 02/2019 TSH 1.86. Update TSH 08/30/21.  Pheochromocytoma Screened     - Comments Notes palpitations, diaphoresis. Consider urine metanephrines at follow up.  Cushing's Syndrome Screened     - Comments No cushinoid appearance.  Coarctation of the Aorta Screened     - Comments R arm BP 163/84 and L arm 144/81.  Compliance Screened    Relevant Labs/Studies:    Latest Ref Rng & Units 09/19/2021   12:00 AM 08/30/2021    3:10 PM 05/07/2020    2:44 PM  Basic Labs  Sodium  135 - 146 mmol/L 132  134  132   Potassium 3.5 - 5.3 mmol/L 4.0  5.1  4.2   Creatinine 0.60 - 1.00 mg/dL 0.48  0.49  0.48        Latest Ref Rng & Units 08/30/2021    3:10 PM 02/18/2019   11:16 AM  Thyroid   TSH 0.450 - 4.500 uIU/mL 2.310  1.86      Disposition:    FU with Dr. Oval Linsey as scheduled in April   Medication Adjustments/Labs and Tests Ordered: Current medicines are reviewed at length with the patient today.  Concerns regarding medicines are outlined above.  Orders Placed This Encounter  Procedures   24 hour blood pressure monitor   VAS US CAROTID   No orders of the defined types were placed in this encounter.  Signed, Loel Dubonnet, NP  05/26/2022 10:04 AM    [place]

## 2022-05-26 NOTE — Patient Instructions (Addendum)
Medication Instructions:  Your physician has recommended you make the following change in your medication:   REDUCE Valsartan to half tablet daily  START Meclizine over the counter May take '25mg'$  as needed three times daily for dizziness  *If you need a refill on your cardiac medications before your next appointment, please call your pharmacy*  Lab Work: None ordered today.   Testing/Procedures: Your physician has requested that you have a carotid duplex. This test is an ultrasound of the carotid arteries in your neck. It looks at blood flow through these arteries that supply the brain with blood. Allow one hour for this exam. There are no restrictions or special instructions.    Follow-Up: At [facility], you and your health needs are our priority.  As part of our continuing mission to provide you with exceptional heart care, we have created designated Provider Care Teams.  These Care Teams include your primary Cardiologist (physician) and Advanced Practice Providers (APPs -  Physician Assistants and Nurse Practitioners) who all work together to provide you with the care you need, when you need it.  We recommend signing up for the patient portal called "MyChart".  Sign up information is provided on this After Visit Summary.  MyChart is used to connect with patients for Virtual Visits (Telemedicine).  Patients are able to view lab/test results, encounter notes, upcoming appointments, etc.  Non-urgent messages can be sent to your provider as well.   To learn more about what you can do with MyChart, go to [email or web address].    Your next appointment:   As scheduled

## 2022-05-28 DIAGNOSIS — R61 Generalized hyperhidrosis: Secondary | ICD-10-CM | POA: Insufficient documentation

## 2022-05-28 NOTE — Progress Notes (Signed)
Tamara Pope - 77 y.o. female MRN [id number]  Date of birth: May 13, 1945  Subjective Chief Complaint  Patient presents with   Anxiety    HPI Tamara Pope is a 77 year old female here today for follow-up visit.  She continues to have difficulty with anxiety as well as sleep.  Small calcification of artery noted on recent imaging at her dentist office.  Seen by cardiology earlier today.  Reassurance provided and carotid ultrasound ordered.  She is on Lexapro but never increases to the 1-1/2 tablets.  She has been trying to decrease her alprazolam and discontinued this occasionally.  She did have BuSpar at her last visit.  Tolerating this well at this time.  Overall sleep is better.  Trazodone does help.  She is concerned about having night sweats.  She is having this on a fairly regular basis.  Episodes are severe enough to soak her clothing.  She has not had any fever or chills.  Denies respiratory symptoms.  Has some numbness and tingling in her feet.  Denies pain or burning.  ROS:  A comprehensive ROS was completed and negative except as noted per HPI  Allergies  Allergen Reactions   Bee Venom Anaphylaxis   Wasp Venom Anaphylaxis    Past Medical History:  Diagnosis Date   Allergy    Anxiety 1990   Arthritis    Dysphonia, spasmodic    Essential hypertension 02/08/2021   GERD (gastroesophageal reflux disease)    History of DVT (deep vein thrombosis)    JAN 2002-- S/P BREAST IMPLANTS  RIGHT UPPER ARM (AXILLARY/ SUBCLAVIN VEIN)   HLD (hyperlipidemia) 01/03/2018   PMB (postmenopausal bleeding)     Past Surgical History:  Procedure Laterality Date   APPENDECTOMY  AS  CHILD   AUGMENTATION MAMMAPLASTY Bilateral    20 years ago   BILATERAL BREAST LIFT AND IMPLANT REDUCTION  12/2012   BREAST ENHANCEMENT SURGERY Bilateral 02/2000   CATARACT EXTRACTION W/ INTRAOCULAR LENS  IMPLANT, BILATERAL     COSMETIC SURGERY  1995   breast implants   DILATATION & CURRETTAGE/HYSTEROSCOPY  WITH RESECTOCOPE N/A 04/18/2013   Procedure: DILATATION & CURETTAGE/HYSTEROSCOPY ;  Surgeon: [name], MD;  Location: [place];  Service: Gynecology;  Laterality: N/A;   EYE SURGERY  2015   cataracts   JOINT REPLACEMENT  2014   Knee replacement   KNEE ARTHROSCOPY W/ MENISCECTOMY Right 2011   ROTATOR CUFF REPAIR Right 2005   skin surgery     TONSILLECTOMY  AS CHILD   TOTAL KNEE ARTHROPLASTY Right 05/03/2015   Procedure: RIGHT TOTAL KNEE ARTHROPLASTY;  Surgeon: Vickey Huger, MD;  Location: [place];  Service: Orthopedics;  Laterality: Right;    Social History   Socioeconomic History   Marital status: Married    Spouse name: Tavonna Rulli   Number of children: 2   Years of education: 14   Highest education level: Associate degree: academic program  Occupational History    Employer: [employer].   Occupation: Retired  Tobacco Use   Smoking status: Never   Smokeless tobacco: Never   Tobacco comments:    None  Vaping Use   Vaping Use: Never used  Substance and Sexual Activity   Alcohol use: Yes    Alcohol/week: 4.0 standard drinks of alcohol    Types: 4 Glasses of wine per week    Comment: 4 glasses of wine a week   Drug use: No   Sexual activity: Not Currently    Partners:  Male  Other Topics Concern   Not on file  Social History Narrative   Lives with her husband. She has two daughters. She enjoys dancing and going out to different restaurants.   Social Determinants of Health   Financial Resource Strain: Low Risk  (06/24/2021)   Overall Financial Resource Strain (CARDIA)    Difficulty of Paying Living Expenses: Not hard at all  Food Insecurity: No Food Insecurity (06/20/2021)   Hunger Vital Sign    Worried About Running Out of Food in the Last Year: Never true    Ran Out of Food in the Last Year: Never true  Transportation Needs: No Transportation Needs (06/20/2021)   PRAPARE - Hydrologist (Medical): No     Lack of Transportation (Non-Medical): No  Physical Activity: Sufficiently Active (06/20/2021)   Exercise Vital Sign    Days of Exercise per Week: 3 days    Minutes of Exercise per Session: 60 min  Stress: No Stress Concern Present (06/24/2021)   [place]    Feeling of Stress : Only a little  Social Connections: Socially Integrated (06/24/2021)   Social Connection and Isolation Panel [NHANES]    Frequency of Communication with Friends and Family: More than three times a week    Frequency of Social Gatherings with Friends and Family: Three times a week    Attends Religious Services: More than 4 times per year    Active Member of Clubs or Organizations: Yes    Attends Archivist Meetings: Patient refused    Marital Status: Married    Family History  Problem Relation Age of Onset   Arthritis Mother    Lung cancer Father    Cancer Father    Diabetes Brother        age 12   Birth defects Brother    Arthritis Maternal Grandmother    Heart disease Maternal Grandmother    Heart attack Paternal Grandmother    Colon cancer Neg Hx    Stomach cancer Neg Hx     Health Maintenance  Topic Date Due   DTaP/Tdap/Td (2 - Td or Tdap) 11/04/2019   Medicare Annual Wellness (AWV)  06/25/2022   COVID-19 Vaccine (4 - 2023-24 season) 05/26/2023 (Originally 12/02/2021)   Pneumonia Vaccine 59+ Years old  Completed   INFLUENZA VACCINE  Completed   DEXA SCAN  Completed   Hepatitis C Screening  Completed   Zoster Vaccines- Shingrix  Completed   HPV VACCINES  Aged Out   COLONOSCOPY (Pts 45-5yr Insurance coverage will need to be confirmed)  Discontinued     ----------------------------------------------------------------------------------------------------------------------------------------------------------------------------------------------------------------- Physical Exam BP (!) 152/75 (BP Location: Left Arm, Patient  Position: Sitting, Cuff Size: Normal)   Pulse 72   Ht '5\' 3"'$  (1.6 m)   Wt 128 lb (58.1 kg)   SpO2 98%   BMI 22.67 kg/m   Physical Exam Constitutional:      Appearance: Normal appearance.  Eyes:     General: No scleral icterus. Cardiovascular:     Rate and Rhythm: Normal rate and regular rhythm.  Pulmonary:     Effort: Pulmonary effort is normal.     Breath sounds: Normal breath sounds.  Musculoskeletal:     Cervical back: Neck supple.  Neurological:     Mental Status: She is alert.  Psychiatric:        Mood and Affect: Mood normal.        Behavior: Behavior normal.     -------------------------------------------------------------------------------------------------------------------------------------------------------------------------------------------------------------------  Assessment and Plan  Essential hypertension She is seeing cardiology for management of blood pressure.  This has been fairly well-controlled based on readings at home.  GAD (generalized anxiety disorder) She continues to have increased anxiety.  Increasing Lexapro to 2015 mg she has not done so yet.  Continue BuSpar.  She may continue alprazolam as needed but try to limit is much as possible.  Referral placed for talk therapy as well.  Night sweats Possibly medication side effect.  Checking labs today.  Chest x-ray ordered.   No orders of the defined types were placed in this encounter.   Return in about 8 weeks (around 07/21/2022) for GAD.    This visit occurred during the SARS-CoV-2 public health emergency.  Safety protocols were in place, including screening questions prior to the visit, additional usage of staff PPE, and extensive cleaning of exam room while observing appropriate contact time as indicated for disinfecting solutions.

## 2022-05-28 NOTE — Assessment & Plan Note (Signed)
Possibly medication side effect.  Checking labs today.  Chest x-ray ordered.

## 2022-05-28 NOTE — Assessment & Plan Note (Signed)
She is seeing cardiology for management of blood pressure.  This has been fairly well-controlled based on readings at home.

## 2022-05-28 NOTE — Assessment & Plan Note (Signed)
She continues to have increased anxiety.  Increasing Lexapro to 2015 mg she has not done so yet.  Continue BuSpar.  She may continue alprazolam as needed but try to limit is much as possible.  Referral placed for talk therapy as well.

## 2022-05-30 LAB — PROTEIN ELECTROPHORESIS, SERUM
Albumin ELP: 4.9 g/dL — ABNORMAL HIGH (ref 3.8–4.8)
Alpha 1: 0.3 g/dL (ref 0.2–0.3)
Alpha 2: 0.6 g/dL (ref 0.5–0.9)
Beta 2: 0.3 g/dL (ref 0.2–0.5)
Beta Globulin: 0.5 g/dL (ref 0.4–0.6)
Gamma Globulin: 0.9 g/dL (ref 0.8–1.7)
Total Protein: 7.4 g/dL (ref 6.1–8.1)

## 2022-05-30 LAB — VITAMIN B12: Vitamin B-12: 601 pg/mL (ref 200–1100)

## 2022-05-30 LAB — CBC WITH DIFFERENTIAL/PLATELET
Absolute Monocytes: 433 {cells}/uL (ref 200–950)
Basophils Absolute: 43 {cells}/uL (ref 0–200)
Basophils Relative: 0.6 %
Eosinophils Absolute: 57 {cells}/uL (ref 15–500)
Eosinophils Relative: 0.8 %
HCT: 40 % (ref 35.0–45.0)
Hemoglobin: 13.8 g/dL (ref 11.7–15.5)
Lymphs Abs: 1754 {cells}/uL (ref 850–3900)
MCH: 31.9 pg (ref 27.0–33.0)
MCHC: 34.5 g/dL (ref 32.0–36.0)
MCV: 92.6 fL (ref 80.0–100.0)
MPV: 11.8 fL (ref 7.5–12.5)
Monocytes Relative: 6.1 %
Neutro Abs: 4814 {cells}/uL (ref 1500–7800)
Neutrophils Relative %: 67.8 %
Platelets: 313 Thousand/uL (ref 140–400)
RBC: 4.32 Million/uL (ref 3.80–5.10)
RDW: 11.7 % (ref 11.0–15.0)
Total Lymphocyte: 24.7 %
WBC: 7.1 Thousand/uL (ref 3.8–10.8)

## 2022-05-30 LAB — COMPLETE METABOLIC PANEL WITH GFR
AG Ratio: 1.7 (calc) (ref 1.0–2.5)
ALT: 20 U/L (ref 6–29)
AST: 19 U/L (ref 10–35)
Albumin: 4.7 g/dL (ref 3.6–5.1)
Alkaline phosphatase (APISO): 61 U/L (ref 37–153)
BUN: 20 mg/dL (ref 7–25)
CO2: 28 mmol/L (ref 20–32)
Calcium: 10.3 mg/dL (ref 8.6–10.4)
Chloride: 99 mmol/L (ref 98–110)
Creat: 0.6 mg/dL (ref 0.60–1.00)
Globulin: 2.7 g/dL (calc) (ref 1.9–3.7)
Glucose, Bld: 155 mg/dL — ABNORMAL HIGH (ref 65–99)
Potassium: 4.6 mmol/L (ref 3.5–5.3)
Sodium: 137 mmol/L (ref 135–146)
Total Bilirubin: 0.6 mg/dL (ref 0.2–1.2)
Total Protein: 7.4 g/dL (ref 6.1–8.1)
eGFR: 92 mL/min/{1.73_m2} (ref >=60)

## 2022-05-30 LAB — TSH: TSH: 1.63 mIU/L (ref 0.40–4.50)

## 2022-05-30 LAB — C-REACTIVE PROTEIN: CRP: 0.6 mg/L (ref <8.0)

## 2022-05-30 LAB — SEDIMENTATION RATE: Sed Rate: 14 mm/h (ref 0–30)

## 2022-05-31 ENCOUNTER — Ambulatory Visit (HOSPITAL_COMMUNITY)
Admission: RE | Admit: 2022-05-31 | Discharge: 2022-05-31 | Disposition: A | Discharge status: Home / Self Care | Payer: Medicare Other | Source: Ambulatory Visit | Attending: Family | Admitting: Family

## 2022-05-31 DIAGNOSIS — R42 Dizziness and giddiness: Secondary | ICD-10-CM

## 2022-06-06 ENCOUNTER — Ambulatory Visit: Payer: Medicare Other | Admitting: Family Medicine

## 2022-06-20 ENCOUNTER — Ambulatory Visit (HOSPITAL_BASED_OUTPATIENT_CLINIC_OR_DEPARTMENT_OTHER): Payer: Medicare Other | Admitting: Cardiovascular Disease

## 2022-06-21 ENCOUNTER — Telehealth: Payer: Self-pay | Admitting: *Deleted

## 2022-06-21 NOTE — Telephone Encounter (Signed)
LMVM-Calling patient to schedule a 24 hour ambulatory blood pressure monitor appointment at [facility] office. Brief description of test given.

## 2022-06-22 ENCOUNTER — Other Ambulatory Visit (HOSPITAL_BASED_OUTPATIENT_CLINIC_OR_DEPARTMENT_OTHER): Payer: Self-pay | Admitting: Family

## 2022-06-22 DIAGNOSIS — R0989 Other specified symptoms and signs involving the circulatory and respiratory systems: Secondary | ICD-10-CM

## 2022-06-22 DIAGNOSIS — J383 Other diseases of vocal cords: Secondary | ICD-10-CM

## 2022-06-22 DIAGNOSIS — I4719 Other supraventricular tachycardia: Secondary | ICD-10-CM

## 2022-06-22 DIAGNOSIS — R42 Dizziness and giddiness: Secondary | ICD-10-CM

## 2022-06-22 DIAGNOSIS — F419 Anxiety disorder, unspecified: Secondary | ICD-10-CM

## 2022-06-22 DIAGNOSIS — R002 Palpitations: Secondary | ICD-10-CM

## 2022-06-22 DIAGNOSIS — R030 Elevated blood-pressure reading, without diagnosis of hypertension: Secondary | ICD-10-CM

## 2022-06-26 DIAGNOSIS — H04123 Dry eye syndrome of bilateral lacrimal glands: Secondary | ICD-10-CM | POA: Diagnosis not present

## 2022-06-26 DIAGNOSIS — Z961 Presence of intraocular lens: Secondary | ICD-10-CM | POA: Diagnosis not present

## 2022-06-26 DIAGNOSIS — Z9842 Cataract extraction status, left eye: Secondary | ICD-10-CM | POA: Diagnosis not present

## 2022-06-26 DIAGNOSIS — Z9841 Cataract extraction status, right eye: Secondary | ICD-10-CM | POA: Diagnosis not present

## 2022-06-27 ENCOUNTER — Ambulatory Visit: Payer: Medicare Other | Attending: Family

## 2022-06-27 DIAGNOSIS — R0989 Other specified symptoms and signs involving the circulatory and respiratory systems: Secondary | ICD-10-CM

## 2022-06-27 DIAGNOSIS — R030 Elevated blood-pressure reading, without diagnosis of hypertension: Secondary | ICD-10-CM | POA: Diagnosis not present

## 2022-06-27 DIAGNOSIS — R42 Dizziness and giddiness: Secondary | ICD-10-CM

## 2022-06-27 NOTE — Progress Notes (Unsigned)
24 hour ambulatory blood pressure monitor applied to patients left arm using standard adult cuff. 

## 2022-06-29 ENCOUNTER — Ambulatory Visit (INDEPENDENT_AMBULATORY_CARE_PROVIDER_SITE_OTHER): Payer: Medicare Other | Admitting: Family Medicine

## 2022-06-29 DIAGNOSIS — Z0000 Encounter for general adult medical examination without abnormal findings: Secondary | ICD-10-CM

## 2022-06-29 NOTE — Progress Notes (Signed)
MEDICARE ANNUAL WELLNESS VISIT  06/29/2022  Telephone Visit Disclaimer This Medicare AWV was conducted by telephone due to national recommendations for restrictions regarding the COVID-19 Pandemic (e.g. social distancing).  I verified, using two identifiers, that I am speaking with Tamara Pope or their authorized healthcare agent. I discussed the limitations, risks, security, and privacy concerns of performing an evaluation and management service by telephone and the potential availability of an in-person appointment in the future. The patient expressed understanding and agreed to proceed.  Location of Patient: Home Location of Provider (nurse):  Provider home  Subjective:    Tamara Pope is a 77 y.o. female patient of Luetta Nutting, DO who had a Medicare Annual Wellness Visit today via telephone. Tamara Pope is Retired and lives with their spouse. she has 2 children. she reports that she is socially active and does interact with friends/family regularly. she is moderately physically active and enjoys dancing and trying different restaurants.  Patient Care Team: Luetta Nutting, DO as PCP - General (Family Medicine) Vickie Epley, MD as PCP - Electrophysiology (Cardiology) Molli Posey, MD as Consulting Physician (Obstetrics and Gynecology)     06/29/2022    9:05 AM 06/24/2021    2:48 PM 09/21/2020   10:23 AM 02/10/2020    8:22 AM 01/29/2019    1:56 PM 12/19/2017    9:26 AM 04/19/2015    9:06 AM  Advanced Directives  Does Patient Have a Medical Advance Directive? Yes Yes Yes Yes Yes Yes Yes  Type of Advance Directive Living will Living will [place];Living will;Out of facility DNR (pink MOST or yellow form) [place];Living will [place];Living will [place];Living will [place];Living will  Does patient want to make changes to medical advance directive? No - Patient  declined No - Patient declined   No - Patient declined  No - Patient declined  Copy of [place] in Chart?   No - copy requested Yes - validated most recent copy scanned in chart (See row information) Yes - validated most recent copy scanned in chart (See row information) No - copy requested No - copy requested    Hospital Utilization Over the Past 12 Months: # of hospitalizations or ER visits: 0 # of surgeries: 0  Review of Systems    Patient reports that her overall health is unchanged compared to last year.  History obtained from chart review and the patient  Patient Reported Readings (BP, Pulse, CBG, Weight, etc) none  Pain Assessment Pain : No/denies pain     Current Medications & Allergies (verified) Allergies as of 06/29/2022       Reactions   Bee Venom Anaphylaxis   Wasp Venom Anaphylaxis        Medication List        Accurate as of June 29, 2022  9:18 AM. If you have any questions, ask your nurse or doctor.          Align 4 MG Caps Take 1 capsule by mouth daily.   ALPRAZolam 0.5 MG tablet Commonly known as: XANAX Take 0.5 mg by mouth daily as needed.   Biotin 5000 MCG Tabs Take 1 tablet by mouth daily.   busPIRone 10 MG tablet Commonly known as: BUSPAR Take 1 tablet (10 mg total) by mouth 2 (two) times daily.   Calcium Carb-Cholecalciferol 600-800 MG-UNIT Tabs Take 1 tablet by mouth 2 (two) times daily.   escitalopram  10 MG tablet Commonly known as: LEXAPRO Take 1.5 tablets (15 mg total) by mouth at bedtime.   estradiol 0.1 MG/GM vaginal cream Commonly known as: ESTRACE Place vaginally as needed.   fluticasone 50 MCG/ACT nasal spray Commonly known as: FLONASE Place 1 spray into both nostrils daily as needed for allergies or rhinitis.   metoprolol succinate 25 MG 24 hr tablet Commonly known as: TOPROL-XL Take 25 mg by mouth daily.   traZODone 50 MG tablet Commonly known as: DESYREL TAKE ONE TO TWO TABLETS BY MOUTH  EVERY NIGHT AT BEDTIME AS NEEDED FOR SLEEP   valsartan 40 MG tablet Commonly known as: DIOVAN Take 0.5 tablets (20 mg total) by mouth daily.   Vitamin D3 50 MCG (2000 UT) Tabs Generic drug: Cholecalciferol Take 1 tablet by mouth daily.        History (reviewed): Past Medical History:  Diagnosis Date   Allergy    Anxiety 1990   Arthritis    Dysphonia, spasmodic    Essential hypertension 02/08/2021   GERD (gastroesophageal reflux disease)    History of DVT (deep vein thrombosis)    JAN 2002-- S/P BREAST IMPLANTS  RIGHT UPPER ARM (AXILLARY/ SUBCLAVIN VEIN)   HLD (hyperlipidemia) 01/03/2018   PMB (postmenopausal bleeding)    Past Surgical History:  Procedure Laterality Date   APPENDECTOMY  AS  CHILD   AUGMENTATION MAMMAPLASTY Bilateral    20 years ago   BILATERAL BREAST LIFT AND IMPLANT REDUCTION  12/2012   BREAST ENHANCEMENT SURGERY Bilateral 02/2000   CATARACT EXTRACTION W/ INTRAOCULAR LENS  IMPLANT, BILATERAL     COSMETIC SURGERY  1995   breast implants   DILATATION & CURRETTAGE/HYSTEROSCOPY WITH RESECTOCOPE N/A 04/18/2013   Procedure: DILATATION & CURETTAGE/HYSTEROSCOPY ;  Surgeon: [name], MD;  Location: [place];  Service: Gynecology;  Laterality: N/A;   EYE SURGERY  2015   cataracts   JOINT REPLACEMENT  2014   Knee replacement   KNEE ARTHROSCOPY W/ MENISCECTOMY Right 2011   ROTATOR CUFF REPAIR Right 2005   skin surgery     TONSILLECTOMY  AS CHILD   TOTAL KNEE ARTHROPLASTY Right 05/03/2015   Procedure: RIGHT TOTAL KNEE ARTHROPLASTY;  Surgeon: Vickey Huger, MD;  Location: [place];  Service: Orthopedics;  Laterality: Right;   Family History  Problem Relation Age of Onset   Arthritis Mother    Hypertension Mother    Lung cancer Father    Cancer Father    Diabetes Brother        age 37   Birth defects Brother    Arthritis Maternal Grandmother    Heart disease Maternal Grandmother    Heart attack Paternal Grandmother    Colon cancer  Neg Hx    Stomach cancer Neg Hx    Social History   Socioeconomic History   Marital status: Married    Spouse name: Tamara Pope   Number of children: 2   Years of education: 14   Highest education level: Associate degree: academic program  Occupational History    Employer: [employer]   Occupation: Retired  Tobacco Use   Smoking status: Never   Smokeless tobacco: Never   Tobacco comments:    None  Vaping Use   Vaping Use: Never used  Substance and Sexual Activity   Alcohol use: Yes    Alcohol/week: 4.0 standard drinks of alcohol    Types: 4 Glasses of wine per week    Comment: 4 glasses of wine a week  Drug use: No   Sexual activity: Not Currently    Partners: Male  Other Topics Concern   Not on file  Social History Narrative   Lives with her husband. She has two daughters. She enjoys dancing and going out to different restaurants.   Social Determinants of Health   Financial Resource Strain: Low Risk  (06/25/2022)   Overall Financial Resource Strain (CARDIA)    Difficulty of Paying Living Expenses: Not hard at all  Food Insecurity: No Food Insecurity (06/25/2022)   Hunger Vital Sign    Worried About Running Out of Food in the Last Year: Never true    Ran Out of Food in the Last Year: Never true  Transportation Needs: No Transportation Needs (06/25/2022)   PRAPARE - Hydrologist (Medical): No    Lack of Transportation (Non-Medical): No  Physical Activity: Insufficiently Active (06/25/2022)   Exercise Vital Sign    Days of Exercise per Week: 2 days    Minutes of Exercise per Session: 60 min  Stress: No Stress Concern Present (06/25/2022)   [place]    Feeling of Stress : Only a little  Social Connections: Socially Integrated (06/29/2022)   Social Connection and Isolation Panel [NHANES]    Frequency of Communication with Friends and Family: More than three times  a week    Frequency of Social Gatherings with Friends and Family: More than three times a week    Attends Religious Services: More than 4 times per year    Active Member of [employer] or Organizations: Yes    Attends Archivist Meetings: More than 4 times per year    Marital Status: Married    Activities of Daily Living    06/29/2022    9:08 AM 06/25/2022    2:51 PM  In your present state of health, do you have any difficulty performing the following activities:  Hearing? 0   Vision?  1  Difficulty concentrating or making decisions?  0  Walking or climbing stairs?  0  Dressing or bathing?  0  Doing errands, shopping?  0  Preparing Food and eating ?  N  Using the Toilet?  N  In the past six months, have you accidently leaked urine?  N  Do you have problems with loss of bowel control?  N  Managing your Medications?  N  Managing your Finances?  N  Housekeeping or managing your Housekeeping?  N    Patient Education/ Literacy How often do you need to have someone help you when you read instructions, pamphlets, or other written materials from your doctor or pharmacy?: 1 - Never What is the last grade level you completed in school?: 2 years of community college  Exercise Current Exercise Habits: Home exercise routine, Type of exercise: strength training/weights, Time (Minutes): 60, Frequency (Times/Week): 2, Weekly Exercise (Minutes/Week): 120, Intensity: Moderate, Exercise limited by: None identified  Diet Patient reports consuming 3 meals a day and 1 snack(s) a day Patient reports that her primary diet is: Regular Patient reports that she does have regular access to food.   Depression Screen    06/29/2022    9:05 AM 05/26/2022   12:07 PM 05/09/2022   10:59 AM 09/19/2021    2:56 PM 06/24/2021    2:49 PM 09/21/2020   10:24 AM 02/10/2020    8:25 AM  PHQ 2/9 Scores  PHQ - 2 Score 0 0 0 1 0 0  0  PHQ- 9 Score  3 1 4   0      Fall Risk    06/29/2022    9:06 AM 06/25/2022     2:51 PM 05/09/2022   10:24 AM 06/24/2021    2:49 PM 06/20/2021    9:05 AM  Fall Risk   Falls in the past year? 0 0 0 0 0  Number falls in past yr: 0 0 0 0 0  Injury with Fall? 0 0 0 0 0  Risk for fall due to : No Fall Risks  No Fall Risks No Fall Risks   Follow up Falls evaluation completed  Falls evaluation completed Falls evaluation completed      Objective:  Tamara Pope seemed alert and oriented and she participated appropriately during our telephone visit.  Blood Pressure Weight BMI  BP Readings from Last 3 Encounters:  05/26/22 (!) 152/75  05/26/22 110/70  05/09/22 (!) 148/80   Wt Readings from Last 3 Encounters:  05/26/22 128 lb (58.1 kg)  05/26/22 128 lb (58.1 kg)  05/09/22 128 lb (58.1 kg)   BMI Readings from Last 1 Encounters:  05/26/22 22.67 kg/m    *Unable to obtain current vital signs, weight, and BMI due to telephone visit type  Hearing/Vision  Tamara Pope did not seem to have difficulty with hearing/understanding during the telephone conversation Reports that she has had a formal eye exam by an eye care professional within the past year Reports that she has not had a formal hearing evaluation within the past year *Unable to fully assess hearing and vision during telephone visit type  Cognitive Function:    06/29/2022    9:10 AM 06/24/2021    2:54 PM  6CIT Screen  What Year? 0 points 0 points  What month? 0 points 0 points  What time? 0 points 0 points  Count back from 20 0 points 0 points  Months in reverse 0 points 0 points  Repeat phrase 4 points 4 points  Total Score 4 points 4 points   (Normal:0-7, Significant for Dysfunction: >8)  Normal Cognitive Function Screening: Yes   Immunization & Health Maintenance Record Immunization History  Administered Date(s) Administered   Influenza, High Dose Seasonal PF 01/14/2015, 01/03/2018, 01/07/2019   Influenza-Unspecified 02/19/2020, 01/20/2021, 02/01/2022   Moderna SARS-COV2 Booster Vaccination  08/03/2020   PFIZER Comirnaty(Gray Top)Covid-19 Tri-Sucrose Vaccine 05/05/2019, 05/27/2019   PFIZER(Purple Top)SARS-COV-2 Vaccination 02/08/2020   Pneumococcal Conjugate-13 10/11/2017   Pneumococcal Polysaccharide-23 02/25/2019   Respiratory Syncytial Virus Vaccine,Recomb Aduvanted(Arexvy) 02/01/2022   Tdap 11/03/2009   Zoster Recombinat (Shingrix) 07/30/2019, 11/17/2019   Zoster, Live 12/05/2019    Health Maintenance  Topic Date Due   DTaP/Tdap/Td (2 - Td or Tdap) 11/04/2019   COVID-19 Vaccine (4 - 2023-24 season) 05/26/2023 (Originally 12/02/2021)   Medicare Annual Wellness (AWV)  06/29/2023   Pneumonia Vaccine 73+ Years old  Completed   INFLUENZA VACCINE  Completed   DEXA SCAN  Completed   Hepatitis C Screening  Completed   Zoster Vaccines- Shingrix  Completed   HPV VACCINES  Aged Out   COLONOSCOPY (Pts 45-7yrs Insurance coverage will need to be confirmed)  Discontinued       Assessment  This is a routine wellness examination for [employer]  Health Maintenance: Due or Overdue Health Maintenance Due  Topic Date Due   DTaP/Tdap/Td (2 - Td or Tdap) 11/04/2019    Tamara Pope does not need a referral for Community Assistance: Care Management:   no  Social Work:    no Prescription Assistance:  no Nutrition/Diabetes Education:  no   Plan:  Personalized Goals  Goals Addressed               This Visit's Progress     Patient Stated (pt-stated)        Patient stated that she would like to continue doing the physical training with the trainer and add more walking to her daily routine.       Personalized Health Maintenance & Screening Recommendations  Td vaccine Bone densitometry screening  Lung Cancer Screening Recommended: no (Low Dose CT Chest recommended if Age 13-80 years, 30 pack-year currently smoking OR have quit w/in past 15 years) Hepatitis C Screening recommended: no HIV Screening recommended: no  Advanced Directives: Written  information was not prepared per patient's request.  Referrals & Orders No orders of the defined types were placed in this encounter.   Follow-up Plan Follow-up with Luetta Nutting, DO as planned Schedule tetanus vaccine at the pharmacy.  Discuss bone density scan with PCP.  Medicare wellness visit in one year.  Patient will access AVS on my chart.   I have personally reviewed and noted the following in the patient's chart:   Medical and social history Use of alcohol, tobacco or illicit drugs  Current medications and supplements Functional ability and status Nutritional status Physical activity Advanced directives List of other physicians Hospitalizations, surgeries, and ER visits in previous 12 months Vitals Screenings to include cognitive, depression, and falls Referrals and appointments  In addition, I have reviewed and discussed with Tamara Pope certain preventive protocols, quality metrics, and best practice recommendations. A written personalized care plan for preventive services as well as general preventive health recommendations is available and can be mailed to the patient at her request.      Tamara Gens, RN BSN  06/29/2022

## 2022-06-29 NOTE — Patient Instructions (Addendum)
[place] Maintenance Summary and Written Plan of Care  Tamara Pope ,  Thank you for allowing me to perform your Medicare Annual Wellness Visit and for your ongoing commitment to your health.   Health Maintenance & Immunization History Health Maintenance  Topic Date Due   DTaP/Tdap/Td (2 - Td or Tdap) 11/04/2019   COVID-19 Vaccine (4 - 2023-24 season) 05/26/2023 (Originally 12/02/2021)   Medicare Annual Wellness (AWV)  06/29/2023   Pneumonia Vaccine 15+ Years old  Completed   INFLUENZA VACCINE  Completed   DEXA SCAN  Completed   Hepatitis C Screening  Completed   Zoster Vaccines- Shingrix  Completed   HPV VACCINES  Aged Out   COLONOSCOPY (Pts 45-30yrs Insurance coverage will need to be confirmed)  Discontinued   Immunization History  Administered Date(s) Administered   Influenza, High Dose Seasonal PF 01/14/2015, 01/03/2018, 01/07/2019   Influenza-Unspecified 02/19/2020, 01/20/2021, 02/01/2022   Moderna SARS-COV2 Booster Vaccination 08/03/2020   PFIZER Comirnaty(Gray Top)Covid-19 Tri-Sucrose Vaccine 05/05/2019, 05/27/2019   PFIZER(Purple Top)SARS-COV-2 Vaccination 02/08/2020   Pneumococcal Conjugate-13 10/11/2017   Pneumococcal Polysaccharide-23 02/25/2019   Respiratory Syncytial Virus Vaccine,Recomb Aduvanted(Arexvy) 02/01/2022   Tdap 11/03/2009   Zoster Recombinat (Shingrix) 07/30/2019, 11/17/2019   Zoster, Live 12/05/2019    These are the patient goals that we discussed:  Goals Addressed               This Visit's Progress     Patient Stated (pt-stated)        Patient stated that she would like to continue doing the physical training with the trainer and add more walking to her daily routine.         This is a list of Health Maintenance Items that are overdue or due now: Health Maintenance Due  Topic Date Due   DTaP/Tdap/Td (2 - Td or Tdap) 11/04/2019  Bone densitometry screening  Orders/Referrals Placed Today: No orders of the  defined types were placed in this encounter.  (Contact our referral department at [phone] if you have not spoken with someone about your referral appointment within the next 5 days)    Follow-up Plan Follow-up with Luetta Nutting, DO as planned Schedule tetanus vaccine at the pharmacy.  Discuss bone density scan with PCP.  Medicare wellness visit in one year.  Patient will access AVS on my chart.      Health Maintenance, Female Adopting a healthy lifestyle and getting preventive care are important in promoting health and wellness. Ask your health care provider about: The right schedule for you to have regular tests and exams. Things you can do on your own to prevent diseases and keep yourself healthy. What should I know about diet, weight, and exercise? Eat a healthy diet  Eat a diet that includes plenty of vegetables, fruits, low-fat dairy products, and lean protein. Do not eat a lot of foods that are high in solid fats, added sugars, or sodium. Maintain a healthy weight Body mass index (BMI) is used to identify weight problems. It estimates body fat based on height and weight. Your health care provider can help determine your BMI and help you achieve or maintain a healthy weight. Get regular exercise Get regular exercise. This is one of the most important things you can do for your health. Most adults should: Exercise for at least 150 minutes each week. The exercise should increase your heart rate and make you sweat (moderate-intensity exercise). Do strengthening exercises at least twice a week. This is in addition  to the moderate-intensity exercise. Spend less time sitting. Even light physical activity can be beneficial. Watch cholesterol and blood lipids Have your blood tested for lipids and cholesterol at 79 years of age, then have this test every 5 years. Have your cholesterol levels checked more often if: Your lipid or cholesterol levels are high. You are older than 79  years of age. You are at high risk for heart disease. What should I know about cancer screening? Depending on your health history and family history, you may need to have cancer screening at various ages. This may include screening for: Breast cancer. Cervical cancer. Colorectal cancer. Skin cancer. Lung cancer. What should I know about heart disease, diabetes, and high blood pressure? Blood pressure and heart disease High blood pressure causes heart disease and increases the risk of stroke. This is more likely to develop in people who have high blood pressure readings or are overweight. Have your blood pressure checked: Every 3-5 years if you are 59-70 years of age. Every year if you are 21 years old or older. Diabetes Have regular diabetes screenings. This checks your fasting blood sugar level. Have the screening done: Once every three years after age 27 if you are at a normal weight and have a low risk for diabetes. More often and at a younger age if you are overweight or have a high risk for diabetes. What should I know about preventing infection? Hepatitis B If you have a higher risk for hepatitis B, you should be screened for this virus. Talk with your health care provider to find out if you are at risk for hepatitis B infection. Hepatitis C Testing is recommended for: Everyone born from [birth year] through 1967. Anyone with known risk factors for hepatitis C. Sexually transmitted infections (STIs) Get screened for STIs, including gonorrhea and chlamydia, if: You are sexually active and are younger than 79 years of age. You are older than 79 years of age and your health care provider tells you that you are at risk for this type of infection. Your sexual activity has changed since you were last screened, and you are at increased risk for chlamydia or gonorrhea. Ask your health care provider if you are at risk. Ask your health care provider about whether you are at high risk for HIV. Your  health care provider may recommend a prescription medicine to help prevent HIV infection. If you choose to take medicine to prevent HIV, you should first get tested for HIV. You should then be tested every 3 months for as long as you are taking the medicine. Pregnancy If you are about to stop having your period (premenopausal) and you may become pregnant, seek counseling before you get pregnant. Take 400 to 800 micrograms (mcg) of folic acid every day if you become pregnant. Ask for birth control (contraception) if you want to prevent pregnancy. Osteoporosis and menopause Osteoporosis is a disease in which the bones lose minerals and strength with aging. This can result in bone fractures. If you are 66 years old or older, or if you are at risk for osteoporosis and fractures, ask your health care provider if you should: Be screened for bone loss. Take a calcium or vitamin D supplement to lower your risk of fractures. Be given hormone replacement therapy (HRT) to treat symptoms of menopause. Follow these instructions at home: Alcohol use Do not drink alcohol if: Your health care provider tells you not to drink. You are pregnant, may be pregnant, or are planning to  become pregnant. If you drink alcohol: Limit how much you have to: 0-1 drink a day. Know how much alcohol is in your drink. In the [place] one drink equals one 12 oz bottle of beer (355 mL), one 5 oz glass of wine (148 mL), or one 1 oz glass of hard liquor (44 mL). Lifestyle Do not use any products that contain nicotine or tobacco. These products include cigarettes, chewing tobacco, and vaping devices, such as e-cigarettes. If you need help quitting, ask your health care provider. Do not use street drugs. Do not share needles. Ask your health care provider for help if you need support or information about quitting drugs. General instructions Schedule regular health, dental, and eye exams. Stay current with your vaccines. Tell your  health care provider if: You often feel depressed. You have ever been abused or do not feel safe at home. Summary Adopting a healthy lifestyle and getting preventive care are important in promoting health and wellness. Follow your health care provider's instructions about healthy diet, exercising, and getting tested or screened for diseases. Follow your health care provider's instructions on monitoring your cholesterol and blood pressure. This information is not intended to replace advice given to you by your health care provider. Make sure you discuss any questions you have with your health care provider. Document Revised: 08/09/2020 Document Reviewed: 08/09/2020 Elsevier Patient Education  [place].

## 2022-07-04 ENCOUNTER — Telehealth: Payer: Self-pay

## 2022-07-04 NOTE — Telephone Encounter (Signed)
Pt lvm stating she would like to stop taking Buspirone and would like advice on how to proceed. States she's having trouble with dizziness.   Sent to Dr. Zigmund Daniel.

## 2022-07-04 NOTE — Telephone Encounter (Signed)
LVM advising patient she was clear to stop the medication. Advised follow-up with Dr. Zigmund Daniel on Thursday.

## 2022-07-05 ENCOUNTER — Encounter (HOSPITAL_BASED_OUTPATIENT_CLINIC_OR_DEPARTMENT_OTHER): Payer: Self-pay | Admitting: Cardiovascular Disease

## 2022-07-05 ENCOUNTER — Ambulatory Visit (INDEPENDENT_AMBULATORY_CARE_PROVIDER_SITE_OTHER): Payer: Medicare Other | Admitting: Cardiovascular Disease

## 2022-07-05 VITALS — BP 133/76 | HR 86 | Ht 63.0 in | Wt 133.8 lb

## 2022-07-05 DIAGNOSIS — I10 Essential (primary) hypertension: Secondary | ICD-10-CM | POA: Diagnosis not present

## 2022-07-05 DIAGNOSIS — R739 Hyperglycemia, unspecified: Secondary | ICD-10-CM | POA: Diagnosis not present

## 2022-07-05 DIAGNOSIS — F411 Generalized anxiety disorder: Secondary | ICD-10-CM

## 2022-07-05 NOTE — Patient Instructions (Addendum)
Medication Instructions:  START TAKING YOUR METOPROLOL AND VALSARTAN IN THE MORNING   Labwork: NONE  Testing/Procedures: NONE  Follow-Up: 12/14/2022 10:05 AM WITH CAITLIN W NP   Any Other Special Instructions Will Be Listed Below (If Applicable). REESTABLISH CARE WITH YOUR THERAPIST     If you need a refill on your cardiac medications before your next appointment, please call your pharmacy.

## 2022-07-05 NOTE — Progress Notes (Signed)
Advanced [facility]pertension [facility] Initial Assessment:    Date:  07/05/2022   ID:  [name], [name] 1945/08/20, MRN [id number]  PCP:  Luetta Nutting, DO  Cardiologist:  None  Nephrologist:  Referring MD: Luetta Nutting, DO   CC: Hypertension  History of Present Illness:    Tamara Pope is a 77 y.o. female with a hx of hypertension, hyperlipidemia, anxiety, GERD, and prior DVT here for follow-up.  She first established care in the advanced hypertension clinic 08/2021.  Her blood pressures were very labile and stress related.  Her PCP had tried her on propranolol but she did not tolerate it.  She wore a 14-day Zio that revealed sinus rhythm with 31 episodes of atrial tachycardia and up to 11 beats of PACs and PVCs.  She saw EP and was started on metoprolol and they felt that her PACs and PVCs were mostly anxiety related.  Valsartan was reduced due to lightheadedness.  She saw Tamara Montana, NP 05/2022 and continued to note lightheadedness and dizziness.  She was inadvertently taking a whole tablet of valsartan and still and noted labile blood pressures ranging from the 90s to the 150s over 40s-70s.  Tamara Pope presents with a chief complaint of anxiety and fluctuating blood pressure. She reports that her anxiety has been causing her blood pressure to go up and down, especially when monitored. She has recently stopped taking one of her blood pressure medications, Bupropion, as it was causing dizziness. Tamara Pope is also on Alprazolam for spasmodic dysphonia and wishes to discontinue its use due to concerns about its potency. She is currently taking Lexapro (Escitalopram) as a mood stabilizer.  Tamara Pope has not been seeing a therapist or psychiatrist recently but has previously seen a therapist, Dr. Ruthann Cancer, whom she likes. She is considering re-establishing sessions with him and seeking a referral for a psychiatrist. She reports that she exercises twice a week with a trainer,  Katrina, and feels great during these sessions. Tamara Pope occasionally experiences lightheadedness and dizziness, but her blood pressure readings during these episodes are not low.  Previous antihypertensives: Propranolol-jittery Valsartan  Past Medical History:  Diagnosis Date   Allergy    Anxiety 1990   Arthritis    Dysphonia, spasmodic    Essential hypertension 02/08/2021   GERD (gastroesophageal reflux disease)    History of DVT (deep vein thrombosis)    JAN 2002-- S/P BREAST IMPLANTS  RIGHT UPPER ARM (AXILLARY/ SUBCLAVIN VEIN)   HLD (hyperlipidemia) 01/03/2018   PMB (postmenopausal bleeding)     Past Surgical History:  Procedure Laterality Date   APPENDECTOMY  AS  CHILD   AUGMENTATION MAMMAPLASTY Bilateral    20 years ago   BILATERAL BREAST LIFT AND IMPLANT REDUCTION  12/2012   BREAST ENHANCEMENT SURGERY Bilateral 02/2000   CATARACT EXTRACTION W/ INTRAOCULAR LENS  IMPLANT, BILATERAL     COSMETIC SURGERY  1995   breast implants   DILATATION & CURRETTAGE/HYSTEROSCOPY WITH RESECTOCOPE N/A 04/18/2013   Procedure: DILATATION & CURETTAGE/HYSTEROSCOPY ;  Surgeon: [name], MD;  Location: [place];  Service: Gynecology;  Laterality: N/A;   EYE SURGERY  2015   cataracts   JOINT REPLACEMENT  2014   Knee replacement   KNEE ARTHROSCOPY W/ MENISCECTOMY Right 2011   ROTATOR CUFF REPAIR Right 2005   skin surgery     TONSILLECTOMY  AS CHILD   TOTAL KNEE ARTHROPLASTY Right 05/03/2015   Procedure: RIGHT TOTAL KNEE ARTHROPLASTY;  Surgeon: Vickey Huger, MD;  Location: [place];  Service: Orthopedics;  Laterality: Right;    Current Medications: Current Meds  Medication Sig   ALPRAZolam (XANAX) 0.5 MG tablet Take 0.5 mg by mouth daily as needed.   Biotin 5000 MCG TABS Take 1 tablet by mouth daily.   Calcium Carb-Cholecalciferol 600-800 MG-UNIT TABS Take 1 tablet by mouth 2 (two) times daily.   Cholecalciferol (VITAMIN D3) 2000 units TABS Take 1 tablet by mouth  daily.   escitalopram (LEXAPRO) 10 MG tablet Take 1.5 tablets (15 mg total) by mouth at bedtime.   estradiol (ESTRACE) 0.1 MG/GM vaginal cream Place vaginally as needed.   fluticasone (FLONASE) 50 MCG/ACT nasal spray Place 1 spray into both nostrils daily as needed for allergies or rhinitis.   metoprolol succinate (TOPROL-XL) 25 MG 24 hr tablet Take 25 mg by mouth daily.   Probiotic Product (ALIGN) 4 MG CAPS Take 1 capsule by mouth daily.   traZODone (DESYREL) 50 MG tablet TAKE ONE TO TWO TABLETS BY MOUTH EVERY NIGHT AT BEDTIME AS NEEDED FOR SLEEP   valsartan (DIOVAN) 40 MG tablet Take 0.5 tablets (20 mg total) by mouth daily.     Allergies:   Bee venom and Wasp venom   Social History   Socioeconomic History   Marital status: Married    Spouse name: Janlyn Prorok   Number of children: 2   Years of education: 14   Highest education level: Associate degree: academic program  Occupational History    Employer: [employer].   Occupation: Retired  Tobacco Use   Smoking status: Never   Smokeless tobacco: Never   Tobacco comments:    None  Vaping Use   Vaping Use: Never used  Substance and Sexual Activity   Alcohol use: Yes    Alcohol/week: 4.0 standard drinks of alcohol    Types: 4 Glasses of wine per week    Comment: 4 glasses of wine a week   Drug use: No   Sexual activity: Not Currently    Partners: Male  Other Topics Concern   Not on file  Social History Narrative   Lives with her husband. She has two daughters. She enjoys dancing and going out to different restaurants.   Social Determinants of Health   Financial Resource Strain: Low Risk  (06/25/2022)   Overall Financial Resource Strain (CARDIA)    Difficulty of Paying Living Expenses: Not hard at all  Food Insecurity: No Food Insecurity (06/25/2022)   Hunger Vital Sign    Worried About Running Out of Food in the Last Year: Never true    Ran Out of Food in the Last Year: Never true  Transportation Needs: No  Transportation Needs (06/25/2022)   PRAPARE - Hydrologist (Medical): No    Lack of Transportation (Non-Medical): No  Physical Activity: Insufficiently Active (06/25/2022)   Exercise Vital Sign    Days of Exercise per Week: 2 days    Minutes of Exercise per Session: 60 min  Stress: No Stress Concern Present (06/25/2022)   [place]    Feeling of Stress : Only a little  Social Connections: Socially Integrated (06/29/2022)   Social Connection and Isolation Panel [NHANES]    Frequency of Communication with Friends and Family: More than three times a week    Frequency of Social Gatherings with Friends and Family: More than three times a week    Attends Religious Services: More than 4 times per year  Active Member of Clubs or Organizations: Yes    Attends Music therapist: More than 4 times per year    Marital Status: Married     Family History: The patient's family history includes Arthritis in her maternal grandmother and mother; Birth defects in her brother; Cancer in her father; Diabetes in her brother; Heart attack in her paternal grandmother; Heart disease in her maternal grandmother; Hypertension in her mother; Lung cancer in her father. There is no history of Colon cancer or Stomach cancer.  ROS:   Please see the history of present illness.     All other systems reviewed and are negative.  EKGs/Labs/Other Studies Reviewed:    EKG:  EKG is not ordered today.   Recent Labs: 05/26/2022: ALT 20; BUN 20; Creat 0.60; Hemoglobin 13.8; Platelets 313; Potassium 4.6; Sodium 137; TSH 1.63   Recent Lipid Panel    Component Value Date/Time   CHOL 260 (H) 09/19/2021 0000   TRIG 107 09/19/2021 0000   HDL 115 09/19/2021 0000   CHOLHDL 2.3 09/19/2021 0000   VLDL 28.8 02/18/2019 1116   LDLCALC 124 (H) 09/19/2021 0000   24 Hour Ambulatory Blood Pressure Report 06/2022:   Overall  Average BP : 126/62 Overall Average HR: 73   Awake Average BP: 136/66 Awake Average HR: 75  Asleep Average BP: 96/46 Asleep Average HR: 66   Impressions: Average awake blood pressure is above goal. Drastic nocturnal dip in blood pressure. Average blood pressure is at goal.  Physical Exam:   VS:  BP 133/76 (BP Location: Right Arm, Patient Position: Sitting, Cuff Size: Normal)   Pulse 86   Ht 5\' 3"  (1.6 m)   Wt 133 lb 12.8 oz (60.7 kg)   SpO2 94%   BMI 23.70 kg/m  , BMI Body mass index is 23.7 kg/m. GENERAL:  Well appearing HEENT: Pupils equal round and reactive, fundi not visualized, oral mucosa unremarkable NECK:  No jugular venous distention, waveform within normal limits, carotid upstroke brisk and symmetric, no bruits, no thyromegaly LUNGS:  Clear to auscultation bilaterally HEART:  RRR.  PMI not displaced or sustained,S1 and S2 within normal limits, no S3, no S4, no clicks, no rubs, no murmurs ABD:  Flat, positive bowel sounds normal in frequency in pitch, no bruits, no rebound, no guarding, no midline pulsatile mass, no hepatomegaly, no splenomegaly EXT:  2 plus pulses throughout, no edema, no cyanosis no clubbing SKIN:  No rashes no nodules NEURO:  Cranial nerves II through XII grossly intact, motor grossly intact throughout PSYCH:  Cognitively intact, oriented to person place and time   ASSESSMENT/PLAN:    # Hypertension: # Anxiety 24-hour blood pressure monitor revealed that her blood pressure is controlled on average.  It tends to be higher during the day and gets very low at night.  I think this continues to demonstrate that anxiety is a big role and she agrees.  She will start taking her losartan and metoprolol in the morning instead of at night.  She is also going to reengage with her therapist and seek out a psychiatrist as well.  Continue Lexapro.  She is interested in getting off of her alprazolam but did not tolerate the buspirone prescribed by her primary care  physician.  Continue with good exercise.  I am hopeful that she mabe able to get off her BP medication with improved BP control.  Screening for Secondary Hypertension:     08/30/2021    5:06 PM  Causes  Drugs/Herbals  Screened  Sleep Apnea Screened     - Comments No snoring.  Thyroid Disease Screened     - Comments 02/2019 TSH 1.86. Update TSH 08/30/21.  Pheochromocytoma Screened     - Comments Notes palpitations, diaphoresis. Consider urine metanephrines at follow up.  Cushing's Syndrome Screened     - Comments No cushinoid appearance.  Coarctation of the Aorta Screened     - Comments R arm BP 163/84 and L arm 144/81.  Compliance Screened    Relevant Labs/Studies:    Latest Ref Rng & Units 05/26/2022    1:20 PM 09/19/2021   12:00 AM 08/30/2021    3:10 PM  Basic Labs  Sodium 135 - 146 mmol/L 137  132  134   Potassium 3.5 - 5.3 mmol/L 4.6  4.0  5.1   Creatinine 0.60 - 1.00 mg/dL 0.60  0.48  0.49        Latest Ref Rng & Units 05/26/2022    1:20 PM 08/30/2021    3:10 PM  Thyroid   TSH 0.40 - 4.50 mIU/L 1.63  2.310       Disposition:    FU with MD/PharmD in 6 months    Medication Adjustments/Labs and Tests Ordered: Current medicines are reviewed at length with the patient today.  Concerns regarding medicines are outlined above.  No orders of the defined types were placed in this encounter.  No orders of the defined types were placed in this encounter.    Signed, Skeet Latch, MD  07/05/2022 1:58 PM    [facility]

## 2022-07-11 ENCOUNTER — Ambulatory Visit: Payer: Medicare Other | Admitting: Psychology

## 2022-07-21 ENCOUNTER — Other Ambulatory Visit: Payer: Self-pay | Admitting: Family Medicine

## 2022-07-21 DIAGNOSIS — R350 Frequency of micturition: Secondary | ICD-10-CM | POA: Diagnosis not present

## 2022-07-21 MED ORDER — ALPRAZOLAM 0.5 MG PO TABS: 0.5000 mg | ORAL_TABLET | Freq: Every day | ORAL | 0 refills | Status: DC | PRN

## 2022-07-26 ENCOUNTER — Ambulatory Visit: Payer: Medicare Other | Admitting: Family Medicine

## 2022-07-26 DIAGNOSIS — R490 Dysphonia: Secondary | ICD-10-CM | POA: Diagnosis not present

## 2022-07-26 DIAGNOSIS — J383 Other diseases of vocal cords: Secondary | ICD-10-CM | POA: Diagnosis not present

## 2022-07-26 DIAGNOSIS — J385 Laryngeal spasm: Secondary | ICD-10-CM | POA: Diagnosis not present

## 2022-07-26 DIAGNOSIS — R498 Other voice and resonance disorders: Secondary | ICD-10-CM | POA: Diagnosis not present

## 2022-07-31 ENCOUNTER — Encounter: Payer: Self-pay | Admitting: Family Medicine

## 2022-07-31 ENCOUNTER — Ambulatory Visit (INDEPENDENT_AMBULATORY_CARE_PROVIDER_SITE_OTHER): Payer: Medicare Other | Admitting: Family Medicine

## 2022-07-31 VITALS — BP 124/68 | HR 76 | Ht 63.0 in | Wt 133.0 lb

## 2022-07-31 DIAGNOSIS — R42 Dizziness and giddiness: Secondary | ICD-10-CM

## 2022-07-31 DIAGNOSIS — I10 Essential (primary) hypertension: Secondary | ICD-10-CM

## 2022-07-31 DIAGNOSIS — F411 Generalized anxiety disorder: Secondary | ICD-10-CM | POA: Diagnosis not present

## 2022-07-31 MED ORDER — CLONAZEPAM 0.5 MG PO TABS: 0.2500 mg | ORAL_TABLET | Freq: Every day | ORAL | 2 refills | Status: DC | PRN

## 2022-07-31 NOTE — Assessment & Plan Note (Signed)
Blood pressure seems to be well-controlled.  We discussed parameters indicating that her blood pressure may be getting too low.

## 2022-07-31 NOTE — Progress Notes (Signed)
Tamara Pope - 77 y.o. female MRN [id number]  Date of birth: 06/04/1945  Subjective Chief Complaint  Patient presents with   Dizziness    HPI Tamara Pope is a 77 year old female here today for follow-up.  She continues to have difficulty with anxiety.  She is trying to come off of alprazolam but does have continued difficulty with this.  Her husband did recently have hip replacement and she has been more anxious regarding this as well as his recovery.  She does continue on Lexapro daily.  She also has trazodone to use to help with sleep.  She also reports having intermittent episodes of dizziness.  She describes this as feeling like feel like the room is moving around her.  She does not feel like she is going to pass out.  She has had cardiac evaluation with Holter monitor as well as echocardiogram.  Does not seem to be orthostatic.  She does feel like she has had some issues with allergies with some sinus pressure.  She is taking allergy medication.  ROS:  A comprehensive ROS was completed and negative except as noted per HPI  Allergies  Allergen Reactions   Bee Venom Anaphylaxis   Wasp Venom Anaphylaxis    Past Medical History:  Diagnosis Date   Allergy    Anxiety 1990   Arthritis    Dysphonia, spasmodic    Essential hypertension 02/08/2021   GERD (gastroesophageal reflux disease)    History of DVT (deep vein thrombosis)    JAN 2002-- S/P BREAST IMPLANTS  RIGHT UPPER ARM (AXILLARY/ SUBCLAVIN VEIN)   HLD (hyperlipidemia) 01/03/2018   PMB (postmenopausal bleeding)     Past Surgical History:  Procedure Laterality Date   APPENDECTOMY  AS  CHILD   AUGMENTATION MAMMAPLASTY Bilateral    20 years ago   BILATERAL BREAST LIFT AND IMPLANT REDUCTION  12/2012   BREAST ENHANCEMENT SURGERY Bilateral 02/2000   CATARACT EXTRACTION W/ INTRAOCULAR LENS  IMPLANT, BILATERAL     COSMETIC SURGERY  1995   breast implants   DILATATION & CURRETTAGE/HYSTEROSCOPY WITH  RESECTOCOPE N/A 04/18/2013   Procedure: DILATATION & CURETTAGE/HYSTEROSCOPY ;  Surgeon: Meriel Pica, MD;  Location: [facility] ;  Service: Gynecology;  Laterality: N/A;   EYE SURGERY  2015   cataracts   JOINT REPLACEMENT  2014   Knee replacement   KNEE ARTHROSCOPY W/ MENISCECTOMY Right 2011   ROTATOR CUFF REPAIR Right 2005   skin surgery     TONSILLECTOMY  AS CHILD   TOTAL KNEE ARTHROPLASTY Right 05/03/2015   Procedure: RIGHT TOTAL KNEE ARTHROPLASTY;  Surgeon: Dannielle Huh, MD;  Location: [facility];  Service: Orthopedics;  Laterality: Right;    Social History   Socioeconomic History   Marital status: Married    Spouse name: Kismet Facemire   Number of children: 2   Years of education: 14   Highest education level: Associate degree: occupational, Scientist, product/process development, or vocational program  Occupational History    Employer: [employer]   Occupation: Retired  Tobacco Use   Smoking status: Never   Smokeless tobacco: Never   Tobacco comments:    None  Vaping Use   Vaping Use: Never used  Substance and Sexual Activity   Alcohol use: Yes    Alcohol/week: 4.0 standard drinks of alcohol    Types: 4 Glasses of wine per week    Comment: 4 glasses of wine a week   Drug use: No   Sexual activity: Not Currently  Partners: Male  Other Topics Concern   Not on file  Social History Narrative   Lives with her husband. She has two daughters. She enjoys dancing and going out to different restaurants.   Social Determinants of Health   Financial Resource Strain: Low Risk  (06/25/2022)   Overall Financial Resource Strain (CARDIA)    Difficulty of Paying Living Expenses: Not hard at all  Food Insecurity: No Food Insecurity (07/27/2022)   Hunger Vital Sign    Worried About Running Out of Food in the Last Year: Never true    Ran Out of Food in the Last Year: Never true  Transportation Needs: No Transportation Needs (07/27/2022)   PRAPARE - Scientist, research (physical sciences) (Medical): No    Lack of Transportation (Non-Medical): No  Physical Activity: Insufficiently Active (07/27/2022)   Exercise Vital Sign    Days of Exercise per Week: 2 days    Minutes of Exercise per Session: 50 min  Stress: No Stress Concern Present (07/27/2022)   [employer] of Occupational Health - Occupational Stress Questionnaire    Feeling of Stress : Only a little  Social Connections: Socially Integrated (07/27/2022)   Social Connection and Isolation Panel [NHANES]    Frequency of Communication with Friends and Family: More than three times a week    Frequency of Social Gatherings with Friends and Family: Three times a week    Attends Religious Services: More than 4 times per year    Active Member of Clubs or Organizations: Yes    Attends Engineer, structural: More than 4 times per year    Marital Status: Married    Family History  Problem Relation Age of Onset   Arthritis Mother    Hypertension Mother    Lung cancer Father    Cancer Father    Diabetes Brother        age 10   Birth defects Brother    Arthritis Maternal Grandmother    Heart disease Maternal Grandmother    Heart attack Paternal Grandmother    Colon cancer Neg Hx    Stomach cancer Neg Hx     Health Maintenance  Topic Date Due   DTaP/Tdap/Td (2 - Td or Tdap) 11/04/2019   COVID-19 Vaccine (4 - 2023-24 season) 05/26/2023 (Originally 12/02/2021)   INFLUENZA VACCINE  11/02/2022   Medicare Annual Wellness (AWV)  06/29/2023   Pneumonia Vaccine 48+ Years old  Completed   DEXA SCAN  Completed   Hepatitis C Screening  Completed   Zoster Vaccines- Shingrix  Completed   HPV VACCINES  Aged Out   COLONOSCOPY (Pts 45-41yrs Insurance coverage will need to be confirmed)  Discontinued      ----------------------------------------------------------------------------------------------------------------------------------------------------------------------------------------------------------------- Physical Exam BP 124/68 (BP Location: Right Arm, Patient Position: Sitting, Cuff Size: Small)   Pulse 76   Ht 5\' 3"  (1.6 m)   Wt 133 lb (60.3 kg)   SpO2 97%   BMI 23.56 kg/m   Physical Exam Constitutional:      Appearance: Normal appearance.  HENT:     Head: Normocephalic and atraumatic.  Eyes:     General: No scleral icterus. Cardiovascular:     Rate and Rhythm: Normal rate and regular rhythm.  Pulmonary:     Effort: Pulmonary effort is normal.     Breath sounds: Normal breath sounds.  Musculoskeletal:     Cervical back: Neck supple.  Neurological:     Mental Status: She is alert.  Psychiatric:  Mood and Affect: Mood normal.        Behavior: Behavior normal.     ------------------------------------------------------------------------------------------------------------------------------------------------------------------------------------------------------------------- Assessment and Plan  Essential hypertension Blood pressure seems to be well-controlled.  We discussed parameters indicating that her blood pressure may be getting too low.  Dizziness She continues to have episodes of dizziness.  She will continue Flonase regularly.  Does not seem to be cardiovascular in nature.  Adding vestibular therapy on.  GAD (generalized anxiety disorder) Continues to have quite a bit of anxiety.  She will continue Lexapro at current strength.  Discontinue alprazolam and changing to benzodiazepine.  Clonazepam at 0.25-0.5 milligrams daily.   Meds ordered this encounter  Medications   clonazePAM (KLONOPIN) 0.5 MG tablet    Sig: Take 0.5 tablets (0.25 mg total) by mouth daily as needed for anxiety.    Dispense:  20 tablet    Refill:  2    Return in about 3  months (around 10/30/2022) for Anxiety.    This visit occurred during the SARS-CoV-2 public health emergency.  Safety protocols were in place, including screening questions prior to the visit, additional usage of staff PPE, and extensive cleaning of exam room while observing appropriate contact time as indicated for disinfecting solutions.

## 2022-07-31 NOTE — Assessment & Plan Note (Signed)
She continues to have episodes of dizziness.  She will continue Flonase regularly.  Does not seem to be cardiovascular in nature.  Adding vestibular therapy on.

## 2022-07-31 NOTE — Assessment & Plan Note (Signed)
Continues to have quite a bit of anxiety.  She will continue Lexapro at current strength.  Discontinue alprazolam and changing to benzodiazepine.  Clonazepam at 0.25-0.5 milligrams daily.

## 2022-07-31 NOTE — Patient Instructions (Addendum)
Great to see you today! Let me know if blood pressure is consistently <100/60 Let's try adding on vestibular therapy.  Stop alprazolam and start clonazepam as needed.

## 2022-08-02 ENCOUNTER — Ambulatory Visit: Payer: Medicare Other | Admitting: Physical Therapy

## 2022-08-04 DIAGNOSIS — J385 Laryngeal spasm: Secondary | ICD-10-CM | POA: Diagnosis not present

## 2022-08-09 ENCOUNTER — Encounter: Payer: Self-pay | Admitting: Physical Therapy

## 2022-08-09 ENCOUNTER — Ambulatory Visit: Payer: Medicare Other | Attending: Family Medicine | Admitting: Physical Therapy

## 2022-08-09 ENCOUNTER — Other Ambulatory Visit: Payer: Self-pay

## 2022-08-09 DIAGNOSIS — R2689 Other abnormalities of gait and mobility: Secondary | ICD-10-CM | POA: Diagnosis not present

## 2022-08-09 DIAGNOSIS — R42 Dizziness and giddiness: Secondary | ICD-10-CM | POA: Diagnosis not present

## 2022-08-09 NOTE — Therapy (Signed)
OUTPATIENT PHYSICAL THERAPY VESTIBULAR EVALUATION  Patient Name: Tamara Pope MRN: [id number] DOB:1945/12/04, 77 y.o., female Today's Date: 08/09/2022  END OF SESSION:  PT End of Session - 08/09/22 1358     Visit Number 1    Number of Visits 6    Date for PT Re-Evaluation 10/04/22    Authorization Type Medicare    PT Start Time 1400    PT Stop Time 1445    PT Time Calculation (min) 45 min    Activity Tolerance Patient tolerated treatment well    Behavior During Therapy WFL for tasks assessed/performed             Past Medical History:  Diagnosis Date   Allergy    Anxiety 1990   Arthritis    Dysphonia, spasmodic    Essential hypertension 02/08/2021   GERD (gastroesophageal reflux disease)    History of DVT (deep vein thrombosis)    JAN 2002-- S/P BREAST IMPLANTS  RIGHT UPPER ARM (AXILLARY/ SUBCLAVIN VEIN)   HLD (hyperlipidemia) 01/03/2018   PMB (postmenopausal bleeding)    Past Surgical History:  Procedure Laterality Date   APPENDECTOMY  AS  CHILD   AUGMENTATION MAMMAPLASTY Bilateral    20 years ago   BILATERAL BREAST LIFT AND IMPLANT REDUCTION  12/2012   BREAST ENHANCEMENT SURGERY Bilateral 02/2000   CATARACT EXTRACTION W/ INTRAOCULAR LENS  IMPLANT, BILATERAL     COSMETIC SURGERY  1995   breast implants   DILATATION & CURRETTAGE/HYSTEROSCOPY WITH RESECTOCOPE N/A 04/18/2013   Procedure: DILATATION & CURETTAGE/HYSTEROSCOPY ;  Surgeon: Meriel Pica, MD;  Location: [facility] [place];  Service: Gynecology;  Laterality: N/A;   EYE SURGERY  2015   cataracts   JOINT REPLACEMENT  2014   Knee replacement   KNEE ARTHROSCOPY W/ MENISCECTOMY Right 2011   ROTATOR CUFF REPAIR Right 2005   skin surgery     TONSILLECTOMY  AS CHILD   TOTAL KNEE ARTHROPLASTY Right 05/03/2015   Procedure: RIGHT TOTAL KNEE ARTHROPLASTY;  Surgeon: Dannielle Huh, MD;  Location: [facility];  Service: Orthopedics;  Laterality: Right;   Patient Active Problem List   Diagnosis Date  Noted   Night sweats 05/28/2022   Wound infection 09/19/2021   Palpitations 09/01/2021   Essential hypertension 02/08/2021   Osteopenia 09/21/2020   Wrist fracture 03/21/2019   History of 2019 novel coronavirus disease (COVID-19) 02/15/2019   Vitamin D deficiency 02/15/2019   Spastic dysphonia 12/10/2018   Dizziness 09/24/2018   HLD (hyperlipidemia) 01/03/2018   Hyperglycemia 01/03/2018   Spasmodic dysphonia 10/11/2017   Allergic rhinitis 10/11/2017   GAD (generalized anxiety disorder) 10/11/2017   Laryngospasms 02/27/2017   Arthritis of midfoot 05/04/2016   S/P total knee arthroplasty 05/03/2015   Minor opacity of both corneas 08/16/2014   Osteoarthritis of knee 12/21/2011   Tear of medial meniscus of knee, current 12/21/2011   Status post cataract extraction and insertion of intraocular lens 11/14/2011   Status post LASIK surgery 10/11/2011    PCP: Everrett Coombe, DO REFERRING PROVIDER: Everrett Coombe, DO  REFERRING DIAG: R42 (ICD-10-CM) - Dizziness  THERAPY DIAG:  Dizziness  Other abnormalities of gait and mobility  ONSET DATE: 3 months  Rationale for Evaluation and Treatment: Rehabilitation  SUBJECTIVE:   SUBJECTIVE STATEMENT: Pt reports she's been getting dizziness. Pt states her dizziness had been getting worse. Sometimes feels it is blood pressure related. Pt gets a fullness in her ear and a lot of sinus problems. First felt it with sitting to standing and  getting from supine to sitting. But now even with sitting she can feel it at times. Not sure what brings it on. Pt reports she has been able to continue to work out and do her normal activities despite the dizziness. Has not noted any big changes in her balance. Pt states she is feeling dizzier now than when she worked out this morning.  Pt accompanied by: self  PERTINENT HISTORY: from H&P on 07/31/22 "She also reports having intermittent episodes of dizziness. She describes this as feeling like feel like the  room is moving around her. She does not feel like she is going to pass out. She has had cardiac evaluation with Holter monitor as well as echocardiogram. "  PAIN:  Are you having pain? No  PRECAUTIONS: None  WEIGHT BEARING RESTRICTIONS: No  FALLS: Has patient fallen in last 6 months? No  LIVING ENVIRONMENT: Lives with: lives with their spouse Lives in: House/apartment Stairs: Yes but has no issues Has following equipment at home: None  PLOF: Independent  PATIENT GOALS: Improve dizziness  OBJECTIVE:   DIAGNOSTIC FINDINGS: none on file  COGNITION: Overall cognitive status: Within functional limits for tasks assessed   SENSATION: WFL  POSTURE:  rounded shoulders and forward head  Cervical ROM:    Active A/PROM (deg) eval  Flexion 100%  Extension 100% "a little dizzy"  Right lateral flexion   Left lateral flexion   Right rotation 100%  Left rotation 100%  (Blank rows = not tested)  STRENGTH: WNL  LOWER EXTREMITY MMT:   MMT Right eval Left eval  Hip flexion    Hip abduction    Hip adduction    Hip internal rotation    Hip external rotation    Knee flexion    Knee extension    Ankle dorsiflexion    Ankle plantarflexion    Ankle inversion    Ankle eversion    (Blank rows = not tested)  BED MOBILITY:  Independent  TRANSFERS: Independent for all transfers  GAIT: Gait pattern: WFL Distance walked: 100' Assistive device utilized: None Level of assistance: Complete Independence Comments: normal reciprocal pattern  PATIENT SURVEYS:  DHI 20%  VESTIBULAR ASSESSMENT:  GENERAL OBSERVATION: slow and guarded motions   SYMPTOM BEHAVIOR:  Subjective history: Pt states it seems to be bad in the afternoon. Rates 5 or 6/10 dizziness this afternoon.  Non-Vestibular symptoms:  none  Type of dizziness: "Swimmyheaded"  Frequency: Has become daily  Duration: ~2-3 hours  Aggravating factors: Induced by position change: sit to stand and "sometimes the  weather"  Relieving factors:  Sometimes lay down if it's really bad but will keep on performing all of her normal activities  Progression of symptoms: worse  OCULOMOTOR EXAM:  Ocular Alignment: normal  Ocular ROM: No Limitations  Spontaneous Nystagmus: absent  Gaze-Induced Nystagmus: right beating with left gaze  Smooth Pursuits: saccades  Saccades: dysmetria and slow   VESTIBULAR - OCULAR REFLEX:   Slow VOR: Normal  VOR Cancellation: Corrective Saccades  Head-Impulse Test: HIT Right: negative HIT Left: negative  Dynamic Visual Acuity: Not able to be assessed  Fast VOR: Mild symptoms with horizontal VOR, vertical no issues   POSITIONAL TESTING: Right Dix-Hallpike: no nystagmus Left Dix-Hallpike: upbeating, left nystagmus, mild symptoms but no nystagmus on 2nd check, and Duration: ~20 sec Right Sidelying: no nystagmus Left Sidelying: no nystagmus  MOTION SENSITIVITY:  Motion Sensitivity Quotient Intensity: 0 = none, 1 = Lightheaded, 2 = Mild, 3 = Moderate, 4 = Severe, 5 =  Vomiting  Intensity  1. Sitting to supine   2. Supine to L side   3. Supine to R side   4. Supine to sitting   5. L Hallpike-Dix   6. Up from L    7. R Hallpike-Dix   8. Up from R    9. Sitting, head tipped to L knee   10. Head up from L knee   11. Sitting, head tipped to R knee   12. Head up from R knee   13. Sitting head turns x5   14.Sitting head nods x5   15. In stance, 180 turn to L    16. In stance, 180 turn to R     OTHOSTATICS: not done  FUNCTIONAL GAIT:  n/a   VESTIBULAR TREATMENT:                                                                                                   DATE: 08/09/22  Canalith Repositioning:  Epley Left: Number of Reps: 2 and Response to Treatment: symptoms resolved  PATIENT EDUCATION: Education details: Exam findings, POC, home canalith repositioning Person educated: Patient and Spouse Education method: Explanation, Demonstration, Verbal cues, and  Handouts Education comprehension: verbalized understanding, returned demonstration, and needs further education  HOME EXERCISE PROGRAM: Home epley maneuver  GOALS: Goals reviewed with patient? Yes  SHORT TERM GOALS: Target date: 08/30/2022   Pt will be ind with home canalith repositioning Baseline: Goal status: INITIAL   LONG TERM GOALS: Target date: 09/20/2022   Pt will be ind with management and progression of HEP Baseline:  Goal status: INITIAL  2.  Pt will have no symptoms with all canalith testing Baseline:  Goal status: INITIAL  3.  Pt will have improved DHI to </=10% to demo improved function Baseline: 20% Goal status: INITIAL  4.  Pt will report return to all normal activities without dizziness Baseline:  Goal status: INITIAL   ASSESSMENT:  CLINICAL IMPRESSION: Patient is a 77 y.o. F who was seen today for physical therapy evaluation and treatment for dizziness. Assessment significant for L posterior canalithiasis during canalith testing for BPPV. Addressed with Epley maneuver this session. Discussed with pt and spouse home maneuver and provided education. Pt will benefit from PT to improve her vestibular system and resolve her BPPV.   OBJECTIVE IMPAIRMENTS: decreased activity tolerance, decreased mobility, and dizziness.   ACTIVITY LIMITATIONS: transfers, bed mobility, and locomotion level  PARTICIPATION LIMITATIONS: shopping and community activity  PERSONAL FACTORS: Fitness, Past/current experiences, and Time since onset of injury/illness/exacerbation are also affecting patient's functional outcome.   REHAB POTENTIAL: Good  CLINICAL DECISION MAKING: Stable/uncomplicated  EVALUATION COMPLEXITY: Low   PLAN:  PT FREQUENCY: 1x/week  PT DURATION: 6 weeks  PLANNED INTERVENTIONS: Therapeutic exercises, Therapeutic activity, Neuromuscular re-education, Balance training, Gait training, Patient/Family education, Self Care, Joint mobilization, Vestibular  training, Canalith repositioning, Dry Needling, Electrical stimulation, Spinal mobilization, Cryotherapy, Moist heat, Taping, Manual therapy, and Re-evaluation  PLAN FOR NEXT SESSION: Assess response to canalith repositioning. Test for BPPV. Review home Epley. Initiate gaze stabilization.    Brooklyne Radke April Ma L  Zlata Alcaide, PT 08/09/2022, 3:03 PM

## 2022-08-12 ENCOUNTER — Other Ambulatory Visit: Payer: Self-pay | Admitting: Family Medicine

## 2022-08-16 ENCOUNTER — Ambulatory Visit: Payer: Medicare Other | Admitting: Physical Therapy

## 2022-08-16 ENCOUNTER — Encounter: Payer: Self-pay | Admitting: Physical Therapy

## 2022-08-16 DIAGNOSIS — R42 Dizziness and giddiness: Secondary | ICD-10-CM | POA: Diagnosis not present

## 2022-08-16 DIAGNOSIS — R2689 Other abnormalities of gait and mobility: Secondary | ICD-10-CM | POA: Diagnosis not present

## 2022-08-16 NOTE — Therapy (Signed)
OUTPATIENT PHYSICAL THERAPY VESTIBULAR TREATMENT  Patient Name: Tamara Pope MRN: [id number] DOB:26-Oct-1945, 77 y.o., female Today's Date: 08/16/2022  END OF SESSION:  PT End of Session - 08/16/22 1145     Visit Number 2    Number of Visits 6    Date for PT Re-Evaluation 10/04/22    Authorization Type Medicare    PT Start Time 1145    PT Stop Time 1225    PT Time Calculation (min) 40 min    Activity Tolerance Patient tolerated treatment well    Behavior During Therapy WFL for tasks assessed/performed             Past Medical History:  Diagnosis Date   Allergy    Anxiety 1990   Arthritis    Dysphonia, spasmodic    Essential hypertension 02/08/2021   GERD (gastroesophageal reflux disease)    History of DVT (deep vein thrombosis)    JAN 2002-- S/P BREAST IMPLANTS  RIGHT UPPER ARM (AXILLARY/ SUBCLAVIN VEIN)   HLD (hyperlipidemia) 01/03/2018   PMB (postmenopausal bleeding)    Past Surgical History:  Procedure Laterality Date   APPENDECTOMY  AS  CHILD   AUGMENTATION MAMMAPLASTY Bilateral    20 years ago   BILATERAL BREAST LIFT AND IMPLANT REDUCTION  12/2012   BREAST ENHANCEMENT SURGERY Bilateral 02/2000   CATARACT EXTRACTION W/ INTRAOCULAR LENS  IMPLANT, BILATERAL     COSMETIC SURGERY  1995   breast implants   DILATATION & CURRETTAGE/HYSTEROSCOPY WITH RESECTOCOPE N/A 04/18/2013   Procedure: DILATATION & CURETTAGE/HYSTEROSCOPY ;  Surgeon: Meriel Pica, MD;  Location: [facility] [place];  Service: Gynecology;  Laterality: N/A;   EYE SURGERY  2015   cataracts   JOINT REPLACEMENT  2014   Knee replacement   KNEE ARTHROSCOPY W/ MENISCECTOMY Right 2011   ROTATOR CUFF REPAIR Right 2005   skin surgery     TONSILLECTOMY  AS CHILD   TOTAL KNEE ARTHROPLASTY Right 05/03/2015   Procedure: RIGHT TOTAL KNEE ARTHROPLASTY;  Surgeon: Dannielle Huh, MD;  Location: [facility];  Service: Orthopedics;  Laterality: Right;   Patient Active Problem List   Diagnosis Date  Noted   Night sweats 05/28/2022   Wound infection 09/19/2021   Palpitations 09/01/2021   Essential hypertension 02/08/2021   Osteopenia 09/21/2020   Wrist fracture 03/21/2019   History of 2019 novel coronavirus disease (COVID-19) 02/15/2019   Vitamin D deficiency 02/15/2019   Spastic dysphonia 12/10/2018   Dizziness 09/24/2018   HLD (hyperlipidemia) 01/03/2018   Hyperglycemia 01/03/2018   Spasmodic dysphonia 10/11/2017   Allergic rhinitis 10/11/2017   GAD (generalized anxiety disorder) 10/11/2017   Laryngospasms 02/27/2017   Arthritis of midfoot 05/04/2016   S/P total knee arthroplasty 05/03/2015   Minor opacity of both corneas 08/16/2014   Osteoarthritis of knee 12/21/2011   Tear of medial meniscus of knee, current 12/21/2011   Status post cataract extraction and insertion of intraocular lens 11/14/2011   Status post LASIK surgery 10/11/2011    PCP: Everrett Coombe, DO REFERRING PROVIDER: Everrett Coombe, DO  REFERRING DIAG: R42 (ICD-10-CM) - Dizziness  THERAPY DIAG:  Dizziness  ONSET DATE: 3 months  Rationale for Evaluation and Treatment: Rehabilitation  SUBJECTIVE:   SUBJECTIVE STATEMENT: Pt reports she felt great after last session. States she has been feeling pretty good but as the week has gone on has started to feel a little dizziness with supine to standing. Pt accompanied by: self  PERTINENT HISTORY: from H&P on 07/31/22 "She also reports having intermittent  episodes of dizziness. She describes this as feeling like feel like the room is moving around her. She does not feel like she is going to pass out. She has had cardiac evaluation with Holter monitor as well as echocardiogram. "  PAIN:  Are you having pain? No  PRECAUTIONS: None  WEIGHT BEARING RESTRICTIONS: No  FALLS: Has patient fallen in last 6 months? No  LIVING ENVIRONMENT: Lives with: lives with their spouse Lives in: House/apartment Stairs: Yes but has no issues Has following equipment at  home: None  PATIENT GOALS: Improve dizziness  OBJECTIVE: (Measures in this section from initial evaluation unless otherwise noted)  POSTURE:  rounded shoulders and forward head  Cervical ROM:    Active A/PROM (deg) eval  Flexion 100%  Extension 100% "a little dizzy"  Right lateral flexion   Left lateral flexion   Right rotation 100%  Left rotation 100%  (Blank rows = not tested)  STRENGTH: WNL  LOWER EXTREMITY MMT:   MMT Right eval Left eval  Hip flexion    Hip abduction    Hip adduction    Hip internal rotation    Hip external rotation    Knee flexion    Knee extension    Ankle dorsiflexion    Ankle plantarflexion    Ankle inversion    Ankle eversion    (Blank rows = not tested)  BED MOBILITY:  Independent  TRANSFERS: Independent for all transfers  GAIT: Gait pattern: WFL Distance walked: 100' Assistive device utilized: None Level of assistance: Complete Independence Comments: normal reciprocal pattern  PATIENT SURVEYS:  DHI 20%  VESTIBULAR ASSESSMENT:  GENERAL OBSERVATION: slow and guarded motions   SYMPTOM BEHAVIOR:  Subjective history: Pt states it seems to be bad in the afternoon. Rates 5 or 6/10 dizziness this afternoon.  Non-Vestibular symptoms:  none  Type of dizziness: "Swimmyheaded"  Frequency: Has become daily  Duration: ~2-3 hours  Aggravating factors: Induced by position change: sit to stand and "sometimes the weather"  Relieving factors:  Sometimes lay down if it's really bad but will keep on performing all of her normal activities  Progression of symptoms: worse  OCULOMOTOR EXAM:  Ocular Alignment: normal  Ocular ROM: No Limitations  Spontaneous Nystagmus: absent  Gaze-Induced Nystagmus: right beating with left gaze  Smooth Pursuits: saccades  Saccades: dysmetria and slow   VESTIBULAR - OCULAR REFLEX:   Slow VOR: Normal  VOR Cancellation: Corrective Saccades  Head-Impulse Test: HIT Right: negative HIT Left:  negative  Dynamic Visual Acuity: Not able to be assessed  Fast VOR: Mild symptoms with horizontal VOR, vertical no issues   POSITIONAL TESTING: Right Dix-Hallpike: no nystagmus Left Dix-Hallpike: upbeating, left nystagmus, mild symptoms but no nystagmus on 2nd check, and Duration: ~20 sec Right Sidelying: no nystagmus Left Sidelying: no nystagmus  08/16/22 Left Dix-Hallpike: Mild symptoms <10 sec but unable to visualize nystagmus Right Dix-Hallpike: Mild symptoms <5 sec but unable to visualize nystagmus; by end of sessions no symptoms Left sidelying: no symptoms Right sidelying: no symptoms   MOTION SENSITIVITY:  Motion Sensitivity Quotient Intensity: 0 = none, 1 = Lightheaded, 2 = Mild, 3 = Moderate, 4 = Severe, 5 = Vomiting  Intensity  1. Sitting to supine   2. Supine to L side   3. Supine to R side   4. Supine to sitting   5. L Hallpike-Dix   6. Up from L    7. R Hallpike-Dix   8. Up from R    9. Sitting,  head tipped to L knee   10. Head up from L knee   11. Sitting, head tipped to R knee   12. Head up from R knee   13. Sitting head turns x5   14.Sitting head nods x5   15. In stance, 180 turn to L    16. In stance, 180 turn to R     OTHOSTATICS: not done  FUNCTIONAL GAIT:  n/a   VESTIBULAR TREATMENT:       08/16/22 Canalith repositioning:  Self Epley Left x1 -- reviewed with pt on how to perform at home [place]ont Left maneuver x1 Neuromuscular re-ed: Francee Piccolo daroff x5 Re-assessed canaliths                                                                                              DATE: 08/09/22  Canalith Repositioning:  Epley Left: Number of Reps: 2 and Response to Treatment: symptoms resolved  PATIENT EDUCATION: Education details: HEP updates, proper head angle for Epley maneuver Person educated: Patient and Spouse Education method: Explanation, Demonstration, Verbal cues, and Handouts Education comprehension: verbalized understanding, returned  demonstration, and needs further education  HOME EXERCISE PROGRAM: Access Code: [email or web address] URL: [email or web address] Date: 08/16/2022 Prepared by: Vernon Prey April Kirstie Peri  Exercises - Self-Epley Maneuver Left Ear  - 1 x daily - 7 x weekly - 1 sets - 3 reps - Brandt-Daroff Vestibular Exercise  - 1 x daily - 7 x weekly - 1 sets - 5 reps - 10 sec hold  GOALS: Goals reviewed with patient? Yes  SHORT TERM GOALS: Target date: 08/30/2022   Pt will be ind with home canalith repositioning Baseline: Goal status: MET   LONG TERM GOALS: Target date: 09/20/2022   Pt will be ind with management and progression of HEP Baseline:  Goal status: INITIAL  2.  Pt will have no symptoms with all canalith testing Baseline:  Goal status: INITIAL  3.  Pt will have improved DHI to </=10% to demo improved function Baseline: 20% Goal status: INITIAL  4.  Pt will report return to all normal activities without dizziness Baseline:  Goal status: INITIAL   ASSESSMENT:  CLINICAL IMPRESSION: Reviewed self-epley maneuver for home. Found that pt had been turning her head too much. Able to demonstrate correct angle by end of session. Initiated working on Public librarian with [employer] to further address when she gets dizziness at home. Will follow up in 2 weeks (pt going to the beach next week).   OBJECTIVE IMPAIRMENTS: decreased activity tolerance, decreased mobility, and dizziness.     PLAN:  PT FREQUENCY: 1x/week  PT DURATION: 6 weeks  PLANNED INTERVENTIONS: Therapeutic exercises, Therapeutic activity, Neuromuscular re-education, Balance training, Gait training, Patient/Family education, Self Care, Joint mobilization, Vestibular training, Canalith repositioning, Dry Needling, Electrical stimulation, Spinal mobilization, Cryotherapy, Moist heat, Taping, Manual therapy, and Re-evaluation  PLAN FOR NEXT SESSION: Assess response to canalith repositioning. Test for BPPV.  Review home Epley. Initiate gaze stabilization.    Hoover Grewe April Ma L Hearl Heikes, PT 08/16/2022, 11:46 AM

## 2022-08-29 ENCOUNTER — Encounter: Payer: Self-pay | Admitting: Physical Therapy

## 2022-08-29 ENCOUNTER — Ambulatory Visit: Payer: Medicare Other | Admitting: Physical Therapy

## 2022-08-29 DIAGNOSIS — R42 Dizziness and giddiness: Secondary | ICD-10-CM | POA: Diagnosis not present

## 2022-08-29 DIAGNOSIS — R2689 Other abnormalities of gait and mobility: Secondary | ICD-10-CM

## 2022-08-29 NOTE — Therapy (Signed)
OUTPATIENT PHYSICAL THERAPY VESTIBULAR TREATMENT  Patient Name: Tamara Pope MRN: [id number] DOB:1945-05-09, 77 y.o., female Today's Date: 08/29/2022  END OF SESSION:  PT End of Session - 08/29/22 1405     Visit Number 3    Number of Visits 6    Date for PT Re-Evaluation 10/04/22    Authorization Type Medicare    PT Start Time 1405    PT Stop Time 1445    PT Time Calculation (min) 40 min    Activity Tolerance Patient tolerated treatment well    Behavior During Therapy [facility] for tasks assessed/performed              Past Medical History:  Diagnosis Date   Allergy    Anxiety 1990   Arthritis    Dysphonia, spasmodic    Essential hypertension 02/08/2021   GERD (gastroesophageal reflux disease)    History of DVT (deep vein thrombosis)    JAN 2002-- S/P BREAST IMPLANTS  RIGHT UPPER ARM (AXILLARY/ SUBCLAVIN VEIN)   HLD (hyperlipidemia) 01/03/2018   PMB (postmenopausal bleeding)    Past Surgical History:  Procedure Laterality Date   APPENDECTOMY  AS  CHILD   AUGMENTATION MAMMAPLASTY Bilateral    20 years ago   BILATERAL BREAST LIFT AND IMPLANT REDUCTION  12/2012   BREAST ENHANCEMENT SURGERY Bilateral 02/2000   CATARACT EXTRACTION W/ INTRAOCULAR LENS  IMPLANT, BILATERAL     COSMETIC SURGERY  1995   breast implants   DILATATION & CURRETTAGE/HYSTEROSCOPY WITH RESECTOCOPE N/A 04/18/2013   Procedure: DILATATION & CURETTAGE/HYSTEROSCOPY ;  Surgeon: Meriel Pica, MD;  Location: [facility] ;  Service: Gynecology;  Laterality: N/A;   EYE SURGERY  2015   cataracts   JOINT REPLACEMENT  2014   Knee replacement   KNEE ARTHROSCOPY W/ MENISCECTOMY Right 2011   ROTATOR CUFF REPAIR Right 2005   skin surgery     TONSILLECTOMY  AS CHILD   TOTAL KNEE ARTHROPLASTY Right 05/03/2015   Procedure: RIGHT TOTAL KNEE ARTHROPLASTY;  Surgeon: Dannielle Huh, MD;  Location: [facility] OR;  Service: Orthopedics;  Laterality: Right;   Patient Active Problem List   Diagnosis  Date Noted   Night sweats 05/28/2022   Wound infection 09/19/2021   Palpitations 09/01/2021   Essential hypertension 02/08/2021   Osteopenia 09/21/2020   Wrist fracture 03/21/2019   History of 2019 novel coronavirus disease (COVID-19) 02/15/2019   Vitamin D deficiency 02/15/2019   Spastic dysphonia 12/10/2018   Dizziness 09/24/2018   HLD (hyperlipidemia) 01/03/2018   Hyperglycemia 01/03/2018   Spasmodic dysphonia 10/11/2017   Allergic rhinitis 10/11/2017   GAD (generalized anxiety disorder) 10/11/2017   Laryngospasms 02/27/2017   Arthritis of midfoot 05/04/2016   S/P total knee arthroplasty 05/03/2015   Minor opacity of both corneas 08/16/2014   Osteoarthritis of knee 12/21/2011   Tear of medial meniscus of knee, current 12/21/2011   Status post cataract extraction and insertion of intraocular lens 11/14/2011   Status post LASIK surgery 10/11/2011    PCP: Everrett Coombe, DO REFERRING PROVIDER: Everrett Coombe, DO  REFERRING DIAG: R42 (ICD-10-CM) - Dizziness  THERAPY DIAG:  Dizziness  Other abnormalities of gait and mobility  ONSET DATE: 3 months  Rationale for Evaluation and Treatment: Rehabilitation  SUBJECTIVE:   SUBJECTIVE STATEMENT: Pt comes into clinic driving for herself. Pt reports she felt very good while at the beach and had no issues. States a little lightheaded after coming back (No overt spinning sensation) -- thinks this may be due to allergies. Feels  a fullness in her left ear.  Pt accompanied by: self  PERTINENT HISTORY: from H&P on 07/31/22 "She also reports having intermittent episodes of dizziness. She describes this as feeling like feel like the room is moving around her. She does not feel like she is going to pass out. She has had cardiac evaluation with Holter monitor as well as echocardiogram. "  PAIN:  Are you having pain? No  PRECAUTIONS: None  WEIGHT BEARING RESTRICTIONS: No  FALLS: Has patient fallen in last 6 months? No  LIVING  ENVIRONMENT: Lives with: lives with their spouse Lives in: House/apartment Stairs: Yes but has no issues Has following equipment at home: None  PATIENT GOALS: Improve dizziness  OBJECTIVE: (Measures in this section from initial evaluation unless otherwise noted)  POSTURE:  rounded shoulders and forward head  Cervical ROM:    Active A/PROM (deg) eval  Flexion 100%  Extension 100% "a little dizzy"  Right lateral flexion   Left lateral flexion   Right rotation 100%  Left rotation 100%  (Blank rows = not tested)  STRENGTH: WNL  LOWER EXTREMITY MMT:   MMT Right eval Left eval  Hip flexion    Hip abduction    Hip adduction    Hip internal rotation    Hip external rotation    Knee flexion    Knee extension    Ankle dorsiflexion    Ankle plantarflexion    Ankle inversion    Ankle eversion    (Blank rows = not tested)  BED MOBILITY:  Independent  TRANSFERS: Independent for all transfers  GAIT: Gait pattern: WFL Distance walked: 100' Assistive device utilized: None Level of assistance: Complete Independence Comments: normal reciprocal pattern  PATIENT SURVEYS:  DHI 20%  (eval) DHI 16% (08/29/22  VESTIBULAR ASSESSMENT:  GENERAL OBSERVATION: slow and guarded motions   SYMPTOM BEHAVIOR:  Subjective history: Pt states it seems to be bad in the afternoon. Rates 5 or 6/10 dizziness this afternoon.  Non-Vestibular symptoms:  none  Type of dizziness: "Swimmyheaded"  Frequency: Has become daily  Duration: ~2-3 hours  Aggravating factors: Induced by position change: sit to stand and "sometimes the weather"  Relieving factors:  Sometimes lay down if it's really bad but will keep on performing all of her normal activities  Progression of symptoms: worse  OCULOMOTOR EXAM:  Ocular Alignment: normal  Ocular ROM: No Limitations  Spontaneous Nystagmus: absent  Gaze-Induced Nystagmus: right beating with left gaze  Smooth Pursuits: saccades  Saccades: dysmetria and  slow   VESTIBULAR - OCULAR REFLEX:   Slow VOR: Normal  VOR Cancellation: Corrective Saccades  Head-Impulse Test: HIT Right: negative HIT Left: negative  Dynamic Visual Acuity: Not able to be assessed  Fast VOR: Mild symptoms with horizontal VOR, vertical no issues   POSITIONAL TESTING: Right Dix-Hallpike: no nystagmus Left Dix-Hallpike: upbeating, left nystagmus, mild symptoms but no nystagmus on 2nd check, and Duration: ~20 sec Right Sidelying: no nystagmus Left Sidelying: no nystagmus  08/16/22 Left Dix-Hallpike: Mild symptoms <10 sec but unable to visualize nystagmus Right Dix-Hallpike: Mild symptoms <5 sec but unable to visualize nystagmus; by end of sessions no symptoms Left sidelying: no symptoms Right sidelying: no symptoms  08/29/22 Left Dix-Hallpike: No nystagmus but pt notes just lightheadedness Right Dix-Hallpike: No nystagmus Left sidelying: No nystagmus just lightheadedness (more than R) Right sidelying: No nystagmus just lightheadedness   MOTION SENSITIVITY:  Motion Sensitivity Quotient Intensity: 0 = none, 1 = Lightheaded, 2 = Mild, 3 = Moderate, 4 = Severe,  5 = Vomiting  Intensity  1. Sitting to supine   2. Supine to L side   3. Supine to R side   4. Supine to sitting   5. L Hallpike-Dix   6. Up from L    7. R Hallpike-Dix   8. Up from R    9. Sitting, head tipped to L knee   10. Head up from L knee   11. Sitting, head tipped to R knee   12. Head up from R knee   13. Sitting head turns x5   14.Sitting head nods x5   15. In stance, 180 turn to L    16. In stance, 180 turn to R     OTHOSTATICS: not done  FUNCTIONAL GAIT:  n/a   VESTIBULAR TREATMENT:       08/29/22 Canalith repositioning:  Cassani/appiani maneuver for R&L horizontal canaliths Neuromuscular re-ed: Francee Piccolo daroff x5 Re-assessed canaliths (see above)  08/16/22 Canalith repositioning:  Self Epley Left x1 -- reviewed with pt on how to perform at home Semont Left maneuver  x1 Neuromuscular re-ed: Austin Miles x5 Re-assessed canaliths                                                                                              DATE: 08/09/22  Canalith Repositioning:  Epley Left: Number of Reps: 2 and Response to Treatment: symptoms resolved  PATIENT EDUCATION: Education details: HEP updates, proper head angle for Epley maneuver Person educated: Patient and Spouse Education method: Explanation, Demonstration, Verbal cues, and Handouts Education comprehension: verbalized understanding, returned demonstration, and needs further education  HOME EXERCISE PROGRAM: Access Code: [email or web address] URL: [email or web address] Date: 08/16/2022 Prepared by: Vernon Prey April Kirstie Peri  Exercises - Self-Epley Maneuver Left Ear  - 1 x daily - 7 x weekly - 1 sets - 3 reps - Brandt-Daroff Vestibular Exercise  - 1 x daily - 7 x weekly - 1 sets - 5 reps - 10 sec hold  GOALS: Goals reviewed with patient? Yes  SHORT TERM GOALS: Target date: 08/30/2022   Pt will be ind with home canalith repositioning Baseline: Goal status: MET   LONG TERM GOALS: Target date: 09/20/2022   Pt will be ind with management and progression of HEP Baseline:  Goal status: MET  2.  Pt will have no symptoms with all canalith testing Baseline:  Goal status: IN PROGRESS  3.  Pt will have improved DHI to </=10% to demo improved function Baseline: 20% 16% (08/29/22) Goal status: IN PROGRESS  4.  Pt will report return to all normal activities without dizziness Baseline:  Goal status: IN PROGRESS   ASSESSMENT:  CLINICAL IMPRESSION: Pt no longer has any nystagmus or overt spinning sensation during testing but more lightheaded feeling. She reports no issues with her balance. She is knowledgeable on how to perform self canalith repositioning at home. She notes she felt good while at the beach and had no symptoms there; however, on the return home her allergies have started to bother  her and cause congestion -- thinks this may be affecting her ear as well. At this time, pt's BPPV appears  cleared from PT perspective -- may need further follow up from ENT. I will hold her chart open for a month in case of any exacerbations.   OBJECTIVE IMPAIRMENTS: decreased activity tolerance, decreased mobility, and dizziness.    PLAN:  PT FREQUENCY: 1x/week  PT DURATION: 6 weeks  PLANNED INTERVENTIONS: Therapeutic exercises, Therapeutic activity, Neuromuscular re-education, Balance training, Gait training, Patient/Family education, Self Care, Joint mobilization, Vestibular training, Canalith repositioning, Dry Needling, Electrical stimulation, Spinal mobilization, Cryotherapy, Moist heat, Taping, Manual therapy, and Re-evaluation  PLAN FOR NEXT SESSION: Assess response to canalith repositioning. Test for BPPV.    Tramya Schoenfelder April Ma L Janal Haak, PT 08/29/2022, 2:05 PM

## 2022-08-30 ENCOUNTER — Other Ambulatory Visit: Payer: Self-pay | Admitting: Family Medicine

## 2022-08-30 DIAGNOSIS — R42 Dizziness and giddiness: Secondary | ICD-10-CM

## 2022-08-30 DIAGNOSIS — H6990 Unspecified Eustachian tube disorder, unspecified ear: Secondary | ICD-10-CM

## 2022-09-05 DIAGNOSIS — D1801 Hemangioma of skin and subcutaneous tissue: Secondary | ICD-10-CM | POA: Diagnosis not present

## 2022-09-05 DIAGNOSIS — D2271 Melanocytic nevi of right lower limb, including hip: Secondary | ICD-10-CM | POA: Diagnosis not present

## 2022-09-05 DIAGNOSIS — L570 Actinic keratosis: Secondary | ICD-10-CM | POA: Diagnosis not present

## 2022-09-05 DIAGNOSIS — Z85828 Personal history of other malignant neoplasm of skin: Secondary | ICD-10-CM | POA: Diagnosis not present

## 2022-09-05 DIAGNOSIS — L821 Other seborrheic keratosis: Secondary | ICD-10-CM | POA: Diagnosis not present

## 2022-09-05 DIAGNOSIS — L814 Other melanin hyperpigmentation: Secondary | ICD-10-CM | POA: Diagnosis not present

## 2022-09-05 DIAGNOSIS — L820 Inflamed seborrheic keratosis: Secondary | ICD-10-CM | POA: Diagnosis not present

## 2022-09-19 DIAGNOSIS — H838X3 Other specified diseases of inner ear, bilateral: Secondary | ICD-10-CM | POA: Diagnosis not present

## 2022-09-19 DIAGNOSIS — R42 Dizziness and giddiness: Secondary | ICD-10-CM | POA: Diagnosis not present

## 2022-09-19 DIAGNOSIS — H903 Sensorineural hearing loss, bilateral: Secondary | ICD-10-CM | POA: Diagnosis not present

## 2022-09-25 ENCOUNTER — Telehealth: Payer: Self-pay | Admitting: Cardiovascular Disease

## 2022-09-25 ENCOUNTER — Other Ambulatory Visit (HOSPITAL_BASED_OUTPATIENT_CLINIC_OR_DEPARTMENT_OTHER): Payer: Self-pay | Admitting: Family

## 2022-09-25 DIAGNOSIS — I10 Essential (primary) hypertension: Secondary | ICD-10-CM

## 2022-09-25 MED ORDER — VALSARTAN 40 MG PO TABS
20.0000 mg | ORAL_TABLET | Freq: Every day | ORAL | 1 refills | Status: DC
Start: 2022-09-25 — End: 2022-10-26

## 2022-09-25 NOTE — Telephone Encounter (Signed)
Returned call to patient, she states her blood pressure is good but she still is having some lightheadedness. She states it has been down in the 110s. She states she would like to try taking it at night to see if that helps. Reminder set to check in on BP in about a week or so. Patient verbalizes understanding.    "Valsartan previously reduced from 40mg  to 20mg  (half tablet) due to lightheadedness. If symptoms are well controlled and BP controlled on 20mg  daily, continue same.    If experiencing lightheadedness on half tablet, please obtain any recent BP readings from home.   Alver Sorrow, NP"

## 2022-09-25 NOTE — Telephone Encounter (Signed)
Pt c/o medication issue:  1. Name of Medication:  valsartan (DIOVAN) 40 MG tablet  2. How are you currently taking this medication (dosage and times per day)?   3. Are you having a reaction (difficulty breathing--STAT)?   4. What is your medication issue?  Patient states this medication causes lightheadedness and she has only been taking half a tablet.

## 2022-09-25 NOTE — Telephone Encounter (Signed)
Rx request sent to pharmacy.  

## 2022-09-25 NOTE — Telephone Encounter (Signed)
Valsartan previously reduced from 40mg  to 20mg  (half tablet) due to lightheadedness. If symptoms are well controlled and BP controlled on 20mg  daily, continue same.   If experiencing lightheadedness on half tablet, please obtain any recent BP readings from home.  Alver Sorrow, NP

## 2022-09-27 DIAGNOSIS — J385 Laryngeal spasm: Secondary | ICD-10-CM | POA: Diagnosis not present

## 2022-09-27 DIAGNOSIS — R490 Dysphonia: Secondary | ICD-10-CM | POA: Diagnosis not present

## 2022-09-27 DIAGNOSIS — J383 Other diseases of vocal cords: Secondary | ICD-10-CM | POA: Diagnosis not present

## 2022-09-27 DIAGNOSIS — R498 Other voice and resonance disorders: Secondary | ICD-10-CM | POA: Diagnosis not present

## 2022-10-04 ENCOUNTER — Encounter (HOSPITAL_BASED_OUTPATIENT_CLINIC_OR_DEPARTMENT_OTHER): Payer: Self-pay

## 2022-10-10 DIAGNOSIS — R42 Dizziness and giddiness: Secondary | ICD-10-CM | POA: Diagnosis not present

## 2022-10-17 DIAGNOSIS — R42 Dizziness and giddiness: Secondary | ICD-10-CM | POA: Diagnosis not present

## 2022-10-17 DIAGNOSIS — H903 Sensorineural hearing loss, bilateral: Secondary | ICD-10-CM | POA: Diagnosis not present

## 2022-10-18 ENCOUNTER — Other Ambulatory Visit: Payer: Self-pay | Admitting: Otolaryngology

## 2022-10-18 DIAGNOSIS — H918X3 Other specified hearing loss, bilateral: Secondary | ICD-10-CM

## 2022-10-26 NOTE — Telephone Encounter (Signed)
Recommend discontinuation of Valsartan. Ensure staying well hydrated, eating regular meals, making position changes slowly to prevent lightheadedness.   Alver Sorrow, NP

## 2022-10-26 NOTE — Telephone Encounter (Signed)
Home Visit / Assisted Living Facility Visit      HPI:   This is a 77 year old yo known pt to me who is seen today to address the following issue(s):    BP follow up - h/o HTN and BP was high at neurology office and I wrote orders as they had requested for Bps to be taken here - that didn't start right away but we do now have multiple readings from this past week and they seem sometimes mildly high for her age but generally reasonable in the 140s/80s give or take.  It was 160s /90s that day , thus the concern.    Was seen by neuro 10/18//23 after having made that appt about 5 months in advance when she was first showing signs of memory loss. IN the meantime had an illness and feels her overall well being including her memory continue to improve, even in these past 102 weeks and definitely since my last visit with her on 10/6.    Also note that we have increased the magnesium oxide from 200 to 400mg to help with constipation (especially since constipation can in turn worsen incontinence/OAB, which she feels has at least been stable this past few weeks, not terribly bothersome).  She feels Bms are regular at this time.       Patient and/or independent historians report the remainder of health to be good or at least unchanged.         Review of Systems     (Negative aside from as mentioned in HPI)       PastMedHx/SurgHx: per EHR/facility charts which have been reviewed by me on the day of  this visit.       Social Hx: per EHR/facility charts which have been reviewed by me on the day of  this visit.     Family Hx: per EHR/facility charts which have been reviewed by me on the day of  this visit.        Medications: per EHR/facility charts which have been reviewed by me on the day of  this visit.              Physical Exam                                                                                                                                                                                                                                                                                                                                                          Psychiatric: NL mood, appropriate affect.  Engaging and conversant.  Neurologic: A&O.  No apparent focal motor or speech deficit.  Head: Normocephalic, atraumatic.  Eyes: NL sclera and conjunctiva and lids bilaterally. PERRL, EOMI.  Heart: Regular rate and rhythm with no murmur heard.  Lungs: Good effort, CTAB with good air exchange throughout.  Skin: Exposed skin is warm and dry without evident ecchymosis, trauma, erythema, or other abnormal lesions.  Extremities: LEs appear NL without edema bilaterally.          ICD-10-CM ICD-9-CM    1. Essential hypertension, malignant  I10 401.0       2. Urinary incontinence, unspecified type  R32 788.30       3. MCI (mild cognitive impairment)  G31.84 331.83                 Plan:    No changes in meds at this time.    We will continue checking BPs for a few days but they look reasonable this week so far.  Continue self-monitoring her symptoms of constipation and OAB, considering reinstating the medication she was previously taking, though when we d/c'd that the thought was that since she is wearing Depends now anyway is it worth taking medication.    Further discussed memory loss , MCI vs dementia, and that it is a spectrum and that managing her physical and mental health and cognitive health all go hand in hand and that she is in this great AL environment to help her manage all of this while maintaining her independence as best possible.  She is quite bright and able to follow complex intellectual conversation with me today, even better than she could last visit and certainly better than when we met in September.   While I do believe that she has MCI, I also realize upon visiting with her today that her neurology visit on Oct 18 was when she was still not fully recovered from her recent illness.  She may indeed have MCI and not mild  dementia - HOWEVER, we both realize again that this is all a spectrum and that our cognition does not improve with age, so no matter where she falls on that spectrum today, she is expected to slowly progress.  Medication has not been recommended at this time.  I'm not sure when her next neuro visit will be but I suspect not for about 6 months.    F/U with me roughly once monthly in the meantime to manage her other medical issues.          My total time on this date for this encounter was just over 60 minutes which included the following activities: preparing to see the patient, reviewing previous hospital and medical records, performing medically appropriate examination and or/evaluation, counseling and educating the patient/family/caregiver, medication management, ordering medications, tests, referring to and communicating with other health care professionals about management, and documenting clinical information in the electronic or other health record.  This time is independent and non-overlapping.     This patient is home bound due to limited mobility and/or cognitive status. Leaving home requires the assistance of another person and is burdensome.

## 2022-10-31 ENCOUNTER — Encounter: Payer: Self-pay | Admitting: Physical Therapy

## 2022-10-31 ENCOUNTER — Ambulatory Visit: Payer: Medicare Other | Attending: Family Medicine | Admitting: Physical Therapy

## 2022-10-31 VITALS — BP 127/70 | HR 75

## 2022-10-31 DIAGNOSIS — R42 Dizziness and giddiness: Secondary | ICD-10-CM | POA: Diagnosis not present

## 2022-10-31 DIAGNOSIS — R2681 Unsteadiness on feet: Secondary | ICD-10-CM | POA: Insufficient documentation

## 2022-10-31 NOTE — Therapy (Signed)
OUTPATIENT PHYSICAL THERAPY VESTIBULAR EVALUATION     Patient Name: Tamara Pope MRN: [id number] DOB:1945-11-01, 77 y.o., female Today's Date: 10/31/2022  END OF SESSION:  PT End of Session - 10/31/22 1603     Visit Number 1    Number of Visits 9    Date for PT Re-Evaluation 11/30/22   due to potential delay in scheduling   Authorization Type Medicare    PT Start Time 1448    PT Stop Time 1535    PT Time Calculation (min) 47 min    Equipment Utilized During Treatment Gait belt    Activity Tolerance Patient tolerated treatment well    Behavior During Therapy WFL for tasks assessed/performed             Past Medical History:  Diagnosis Date   Allergy    Anxiety 1990   Arthritis    Dysphonia, spasmodic    Essential hypertension 02/08/2021   GERD (gastroesophageal reflux disease)    History of DVT (deep vein thrombosis)    JAN 2002-- S/P BREAST IMPLANTS  RIGHT UPPER ARM (AXILLARY/ SUBCLAVIN VEIN)   HLD (hyperlipidemia) 01/03/2018   PMB (postmenopausal bleeding)    Past Surgical History:  Procedure Laterality Date   APPENDECTOMY  AS  CHILD   AUGMENTATION MAMMAPLASTY Bilateral    20 years ago   BILATERAL BREAST LIFT AND IMPLANT REDUCTION  12/2012   BREAST ENHANCEMENT SURGERY Bilateral 02/2000   CATARACT EXTRACTION W/ INTRAOCULAR LENS  IMPLANT, BILATERAL     COSMETIC SURGERY  1995   breast implants   DILATATION & CURRETTAGE/HYSTEROSCOPY WITH RESECTOCOPE N/A 04/18/2013   Procedure: DILATATION & CURETTAGE/HYSTEROSCOPY ;  Surgeon: Meriel Pica, MD;  Location: [facility];  Service: Gynecology;  Laterality: N/A;   EYE SURGERY  2015   cataracts   JOINT REPLACEMENT  2014   Knee replacement   KNEE ARTHROSCOPY W/ MENISCECTOMY Right 2011   ROTATOR CUFF REPAIR Right 2005   skin surgery     TONSILLECTOMY  AS CHILD   TOTAL KNEE ARTHROPLASTY Right 05/03/2015   Procedure: RIGHT TOTAL KNEE ARTHROPLASTY;  Surgeon: Dannielle Huh, MD;  Location: [facility]  OR;  Service: Orthopedics;  Laterality: Right;   Patient Active Problem List   Diagnosis Date Noted   Night sweats 05/28/2022   Wound infection 09/19/2021   Palpitations 09/01/2021   Essential hypertension 02/08/2021   Osteopenia 09/21/2020   Wrist fracture 03/21/2019   History of 2019 novel coronavirus disease (COVID-19) 02/15/2019   Vitamin D deficiency 02/15/2019   Spastic dysphonia 12/10/2018   Dizziness 09/24/2018   HLD (hyperlipidemia) 01/03/2018   Hyperglycemia 01/03/2018   Spasmodic dysphonia 10/11/2017   Allergic rhinitis 10/11/2017   GAD (generalized anxiety disorder) 10/11/2017   Laryngospasms 02/27/2017   Arthritis of midfoot 05/04/2016   S/P total knee arthroplasty 05/03/2015   Minor opacity of both corneas 08/16/2014   Osteoarthritis of knee 12/21/2011   Tear of medial meniscus of knee, current 12/21/2011   Status post cataract extraction and insertion of intraocular lens 11/14/2011   Status post LASIK surgery 10/11/2011    PCP: Everrett Coombe, DO   REFERRING PROVIDER:  Newman Pies, MD  REFERRING DIAG: R42 (ICD-10-CM) - Dizziness   THERAPY DIAG:  Dizziness and giddiness  Unsteadiness on feet  ONSET DATE: 10/23/2022   Rationale for Evaluation and Treatment: Rehabilitation  SUBJECTIVE:   SUBJECTIVE STATEMENT: Reports dizziness started about 6 months ago. Was feeling a little lightheaded one day and then it just got worse.  Saw a vestibular PT in [place] for BPPV in May and had crystals back where they belonged and then was still having dizziness. Will get up one day and will feel fine. Then another day will feel off. Reports today is feeling off. Takes Meclizine as needed. Reports it seems to help. Balance will feel off as well, no falls. Husband has looked up some central vestibular exercises to do at home Feels like a pulsing in both ears that she will feel, more in the L ear. Ears feel full and then need to pop.  Dr. Suszanne Conners ordered a brain MRI, but has  not been scheduled yet. Reports the later on in the day, the better she will feel. When she goes to bed, will hardly notice it. Had a very mild case of COVID in the beginning of January. Took a dramamine this morning.   Pt accompanied by:  Levon Hedger   PERTINENT HISTORY: PMH: Anxiety, HTN, HLD  Recurrent dizziness, symptomatic >6 months, dizziness is more light headed and off balance.   Per Dr. Suszanne Conners: recurrent dizziness, likely secondary to central vestibular dysfunction  PAIN:  Are you having pain? No  Vitals:   10/31/22 1522  BP: 127/70  Pulse: 75     PRECAUTIONS: None  WEIGHT BEARING RESTRICTIONS: No  FALLS: Has patient fallen in last 6 months? No  PLOF: Independent and Leisure: getting back to being more active. Enjoys working out with weights 2x a week and doing a stretch class   PATIENT GOALS: Improve dizziness   OBJECTIVE:   DIAGNOSTIC FINDINGS: Brain MRI has been ordered   COGNITION: Overall cognitive status: Within functional limits for tasks assessed    GAIT: Gait pattern: step through pattern Distance walked: Clinic distances  Assistive device utilized: None Level of assistance: Modified independence Comments: No unsteadiness noted during ambulation during clinic    PATIENT SURVEYS:  FOTO DFS: 56, DPS: 66.7  VESTIBULAR ASSESSMENT:  GENERAL OBSERVATION:    SYMPTOM BEHAVIOR:  Subjective history: See above.   Non-Vestibular symptoms: nausea/vomiting and when feeling very dizzy might have some nausea and has lost her appetite, buzzing in ears  Type of dizziness: Imbalance (Disequilibrium), Spinning/Vertigo, "Funny feeling in the head", and "buzzing feeling in head"   Frequency: Couple times a week  Duration: Varies   Aggravating factors: Induced by motion: looking up at the ceiling, turning head quickly, and head going up and down, moving head side to side   Relieving factors:  taking Meclizine  Progression of symptoms: worse  OCULOMOTOR  EXAM:  Ocular Alignment: normal  Ocular ROM: No Limitations  Spontaneous Nystagmus: absent  Gaze-Induced Nystagmus: absent  Smooth Pursuits: intact  Saccades:  ~2 saccadic beats, slightly slower     VESTIBULAR - OCULAR REFLEX:   Slow VOR: Positive Right and Comment: one episode of being unable to keep eyes on therapist's nose when going to the R, mild dizziness   VOR Cancellation: Normal  Head-Impulse Test: HIT Right: positive HIT Left: negative Very slight, incr in dizziness   Dynamic Visual Acuity: Did not test during eval    POSITIONAL TESTING: Right Dix-Hallpike: no nystagmus Left Dix-Hallpike: no nystagmus Right Roll Test: no nystagmus  Left Roll Test: no nystagmus Right Sidelying: no nystagmus Left Sidelying: no nystagmus, pt reporting some spinning sensation and normal buzzing in her ears   When coming up from DixHallpike, pt feels a little spinny   In R and L sidelying and DixHallpike positions, pt feeling her normal buzzing in her ears.  In L sidelying position, pt reporting a spinning sensation, but no nystagmus noted   Pt reporting spinning when lying supine on mat table     M-CTSIB  Condition 1: Firm Surface, EO 30 Sec, Normal Sway  Condition 2: Firm Surface, EC 30 Sec, Normal Sway  Condition 3: Foam Surface, EO 30 Sec, Normal Sway  Condition 4: Foam Surface, EC 30 Sec, Moderate       [facility] PT Assessment - 10/31/22 1536       Functional Gait  Assessment   Gait assessed  Yes    Gait Level Surface Walks 20 ft in less than 5.5 sec, no assistive devices, good speed, no evidence for imbalance, normal gait pattern, deviates no more than 6 in outside of the 12 in walkway width.    Change in Gait Speed Able to smoothly change walking speed without loss of balance or gait deviation. Deviate no more than 6 in outside of the 12 in walkway width.    Gait with Horizontal Head Turns Performs head turns smoothly with slight change in gait velocity (eg, minor disruption to  smooth gait path), deviates 6-10 in outside 12 in walkway width, or uses an assistive device.    Gait with Vertical Head Turns Performs task with slight change in gait velocity (eg, minor disruption to smooth gait path), deviates 6 - 10 in outside 12 in walkway width or uses assistive device    Gait and Pivot Turn Pivot turns safely within 3 sec and stops quickly with no loss of balance.    Step Over Obstacle Is able to step over 2 stacked shoe boxes taped together (9 in total height) without changing gait speed. No evidence of imbalance.    Gait with Narrow Base of Support Ambulates 7-9 steps.    Gait with Eyes Closed Walks 20 ft, uses assistive device, slower speed, mild gait deviations, deviates 6-10 in outside 12 in walkway width. Ambulates 20 ft in less than 9 sec but greater than 7 sec.   8.65 seconds, veers to L   Ambulating Backwards Walks 20 ft, no assistive devices, good speed, no evidence for imbalance, normal gait    Steps Alternating feet, no rail.    Total Score 26    FGA comment: 26/30               VESTIBULAR TREATMENT:                                                                                                   N/A during eval   PATIENT EDUCATION: Education details: Clinical findings, POC, trying not to take Meclizine/Dramamine at least 24 hrs before next session to assess positional testing (pt was positive for this in May and pt reported some spinning, but no nystagmus was noted) Person educated: Patient and Spouse Education method: Explanation Education comprehension: verbalized understanding  HOME EXERCISE PROGRAM: Will provide at future session   GOALS: Goals reviewed with patient? Yes  SHORT TERM GOALS: ALL STGS = LTGS   LONG TERM GOALS: Target date: 11/28/2022  Pt will be independent with  final HEP for balance/dizziness in order to build upon functional gains made in therapy.  Baseline:  Goal status: INITIAL  2.  SOT to be assessed with goal  written.  Baseline:  Goal status: INITIAL  3.  DVA to be assessed with goal written as needed  Baseline:  Goal status: INITIAL  4.  Pt will improve FGA to at least a 29/30 in order to demo improvements with head motions and EC  Baseline:  Goal status: INITIAL  5.  Pt will improve DFS to at least 59 in order to demo improved functional outcomes related to dizziness.   Baseline: 56 Goal status: INITIAL    ASSESSMENT:  CLINICAL IMPRESSION: Patient is a 77 year old female referred to Neuro [facility] for dizziness.   Pt's PMH is significant for: Anxiety, HTN, HLD.  Pt seen for vestibular therapy in May 2024 at [facility] and was found to have BPPV and was treated, but pt continued to have dizziness symptoms after that. Pt saw Dr. Suszanne Conners and findings were recurrent dizziness, likely secondary to central vestibular dysfunction. MRI has been ordered for pt. The following deficits were present during the exam: impaired balance, positive HIT indicating impaired VOR, impaired oculomotor exam, decr vestibular input for balance based on mCTSIB. Pt reporting some spinning in some positional testing positions or when coming upright, but no nystagmus was noted today. Pt did take Dramamine this morning, so that could potentially be suppressing nystagmus due to pt being positive for BPPV back in May. Will re-assess at a future session when pt not on Dramamine/Meclizine. Pt not at a fall risk based on FGA, but did have difficulty with EC, head motions, and tandem gait.  Pt would benefit from skilled PT to address these impairments and functional limitations to maximize functional mobility independence and decr fall risk.    OBJECTIVE IMPAIRMENTS: decreased activity tolerance, decreased balance, and dizziness.   ACTIVITY LIMITATIONS: locomotion level and looking up/down, head movements   PARTICIPATION LIMITATIONS: driving, community activity, and exercising   PERSONAL FACTORS: Age, Behavior pattern,  Past/current experiences, Time since onset of injury/illness/exacerbation, and 1-2 comorbidities: Anxiety, HTN, HLD  are also affecting patient's functional outcome.   REHAB POTENTIAL: Good  CLINICAL DECISION MAKING: Evolving/moderate complexity  EVALUATION COMPLEXITY: Moderate   PLAN:  PT FREQUENCY: 2x/week  PT DURATION: 8 weeks  PLANNED INTERVENTIONS: Therapeutic exercises, Therapeutic activity, Neuromuscular re-education, Balance training, Gait training, Patient/Family education, Self Care, Joint mobilization, Vestibular training, Canalith repositioning, and Re-evaluation  PLAN FOR NEXT SESSION: Perform SOT and write goal. Initiate HEP for balance and VOR , head motions. Re-assess positional testing with pt not on meclizine or dramamine    Drake Leach, PT, DPT 10/31/2022, 4:13 PM

## 2022-11-01 ENCOUNTER — Ambulatory Visit (INDEPENDENT_AMBULATORY_CARE_PROVIDER_SITE_OTHER): Payer: Medicare Other | Admitting: Family Medicine

## 2022-11-01 ENCOUNTER — Encounter: Payer: Self-pay | Admitting: Family Medicine

## 2022-11-01 ENCOUNTER — Other Ambulatory Visit: Payer: Self-pay | Admitting: Family Medicine

## 2022-11-01 VITALS — BP 101/63 | HR 71 | Ht 63.0 in | Wt 135.0 lb

## 2022-11-01 DIAGNOSIS — H9319 Tinnitus, unspecified ear: Secondary | ICD-10-CM | POA: Diagnosis not present

## 2022-11-01 DIAGNOSIS — R42 Dizziness and giddiness: Secondary | ICD-10-CM

## 2022-11-01 DIAGNOSIS — E559 Vitamin D deficiency, unspecified: Secondary | ICD-10-CM | POA: Diagnosis not present

## 2022-11-01 DIAGNOSIS — F411 Generalized anxiety disorder: Secondary | ICD-10-CM

## 2022-11-01 DIAGNOSIS — M898X9 Other specified disorders of bone, unspecified site: Secondary | ICD-10-CM

## 2022-11-01 DIAGNOSIS — I10 Essential (primary) hypertension: Secondary | ICD-10-CM | POA: Diagnosis not present

## 2022-11-01 DIAGNOSIS — R209 Unspecified disturbances of skin sensation: Secondary | ICD-10-CM | POA: Diagnosis not present

## 2022-11-01 MED ORDER — CLONAZEPAM 0.5 MG PO TABS: 0.2500 mg | ORAL_TABLET | Freq: Every day | ORAL | 2 refills | Status: DC | PRN

## 2022-11-01 MED ORDER — TRIAZOLAM 0.25 MG PO TABS: 0.2500 mg | ORAL_TABLET | Freq: Once | ORAL | 0 refills | Status: DC

## 2022-11-01 NOTE — Assessment & Plan Note (Signed)
She does have some numbing tingling sensations in her feet.  Update labs to look for metabolic causes.

## 2022-11-01 NOTE — Assessment & Plan Note (Signed)
Recurrent episodes of dizziness.  Has had ENT evaluation.  Has upcoming MRI.  Adding triazolam prior to MRI.  She does have a driver.

## 2022-11-01 NOTE — Progress Notes (Signed)
Tamara Pope - 77 y.o. female MRN [id number]  Date of birth: 13-Aug-1945  Subjective Chief Complaint  Patient presents with   Follow-up    HPI Tamara Pope is a 77 y.o. female here today for follow-up.  She was recently evaluated by ENT for her ongoing dizziness as well as tinnitus.  She has discontinued her valsartan due to hypotension.  This has not really improved her.  She still continues to have intermittent episodes of dizziness that seem to occur at random intervals.  She does have an MRI coming up and is requesting medication to take prior to her MRI to help with relaxation.  Her hearing has been evaluated without any significant deficit.  She is not on any ototoxic symptoms.  She does continue to have some anxiety.  Remains on Lexapro 15 mg daily.  Using trazodone for sleep.  Also with BuSpar.  We did change her from alprazolam to clonazepam previously.  She reports that this does work pretty well for her.  She is still using this fairly often.  She did see a therapist at 1 point but reports that this was not helpful.  ROS:  A comprehensive ROS was completed and negative except as noted per HPI     Allergies  Allergen Reactions   Bee Venom Anaphylaxis   Wasp Venom Anaphylaxis    Past Medical History:  Diagnosis Date   Allergy    Anxiety 1990   Arthritis    Dysphonia, spasmodic    Essential hypertension 02/08/2021   GERD (gastroesophageal reflux disease)    History of DVT (deep vein thrombosis)    JAN 2002-- S/P BREAST IMPLANTS  RIGHT UPPER ARM (AXILLARY/ SUBCLAVIN VEIN)   HLD (hyperlipidemia) 01/03/2018   PMB (postmenopausal bleeding)     Past Surgical History:  Procedure Laterality Date   APPENDECTOMY  AS  CHILD   AUGMENTATION MAMMAPLASTY Bilateral    20 years ago   BILATERAL BREAST LIFT AND IMPLANT REDUCTION  12/2012   BREAST ENHANCEMENT SURGERY Bilateral 02/2000   CATARACT EXTRACTION W/ INTRAOCULAR LENS  IMPLANT, BILATERAL     COSMETIC  SURGERY  1995   breast implants   DILATATION & CURRETTAGE/HYSTEROSCOPY WITH RESECTOCOPE N/A 04/18/2013   Procedure: DILATATION & CURETTAGE/HYSTEROSCOPY ;  Surgeon: Meriel Pica, MD;  Location: [facility] [place];  Service: Gynecology;  Laterality: N/A;   EYE SURGERY  2015   cataracts   JOINT REPLACEMENT  2014   Knee replacement   KNEE ARTHROSCOPY W/ MENISCECTOMY Right 2011   ROTATOR CUFF REPAIR Right 2005   skin surgery     TONSILLECTOMY  AS CHILD   TOTAL KNEE ARTHROPLASTY Right 05/03/2015   Procedure: RIGHT TOTAL KNEE ARTHROPLASTY;  Surgeon: Dannielle Huh, MD;  Location: [facility];  Service: Orthopedics;  Laterality: Right;    Social History   Socioeconomic History   Marital status: Married    Spouse name: Seniqua Gilliard   Number of children: 2   Years of education: 14   Highest education level: Associate degree: occupational, Scientist, product/process development, or vocational program  Occupational History    Employer: [employer]   Occupation: Retired  Tobacco Use   Smoking status: Never   Smokeless tobacco: Never   Tobacco comments:    None  Vaping Use   Vaping status: Never Used  Substance and Sexual Activity   Alcohol use: Yes    Alcohol/week: 4.0 standard drinks of alcohol    Types: 4 Glasses of wine per week  Comment: 4 glasses of wine a week   Drug use: No   Sexual activity: Not Currently    Partners: Male  Other Topics Concern   Not on file  Social History Narrative   Lives with her husband. She has two daughters. She enjoys dancing and going out to different restaurants.   Social Determinants of Health   Financial Resource Strain: Low Risk  (06/25/2022)   Overall Financial Resource Strain (CARDIA)    Difficulty of Paying Living Expenses: Not hard at all  Food Insecurity: No Food Insecurity (07/27/2022)   Hunger Vital Sign    Worried About Running Out of Food in the Last Year: Never true    Ran Out of Food in the Last Year: Never true  Transportation Needs: No  Transportation Needs (07/27/2022)   PRAPARE - Administrator, Civil Service (Medical): No    Lack of Transportation (Non-Medical): No  Physical Activity: Insufficiently Active (07/27/2022)   Exercise Vital Sign    Days of Exercise per Week: 2 days    Minutes of Exercise per Session: 50 min  Stress: No Stress Concern Present (07/27/2022)   [employer] of Occupational Health - Occupational Stress Questionnaire    Feeling of Stress : Only a little  Social Connections: Socially Integrated (07/27/2022)   Social Connection and Isolation Panel [NHANES]    Frequency of Communication with Friends and Family: More than three times a week    Frequency of Social Gatherings with Friends and Family: Three times a week    Attends Religious Services: More than 4 times per year    Active Member of Clubs or Organizations: Yes    Attends Engineer, structural: More than 4 times per year    Marital Status: Married    Family History  Problem Relation Age of Onset   Arthritis Mother    Hypertension Mother    Lung cancer Father    Cancer Father    Diabetes Brother        age 58   Birth defects Brother    Arthritis Maternal Grandmother    Heart disease Maternal Grandmother    Heart attack Paternal Grandmother    Colon cancer Neg Hx    Stomach cancer Neg Hx     Health Maintenance  Topic Date Due   DTaP/Tdap/Td (2 - Td or Tdap) 11/04/2019   COVID-19 Vaccine (4 - 2023-24 season) 05/26/2023 (Originally 12/02/2021)   INFLUENZA VACCINE  11/02/2022   Medicare Annual Wellness (AWV)  06/29/2023   Pneumonia Vaccine 38+ Years old  Completed   DEXA SCAN  Completed   Hepatitis C Screening  Completed   Zoster Vaccines- Shingrix  Completed   HPV VACCINES  Aged Out   Colonoscopy  Discontinued      ----------------------------------------------------------------------------------------------------------------------------------------------------------------------------------------------------------------- Physical Exam BP 101/63 (BP Location: Left Arm, Patient Position: Sitting, Cuff Size: Normal)   Pulse 71   Ht 5\' 3"  (1.6 m)   Wt 135 lb (61.2 kg)   SpO2 96%   BMI 23.91 kg/m   Physical Exam Constitutional:      Appearance: Normal appearance.  HENT:     Head: Normocephalic and atraumatic.  Eyes:     General: No scleral icterus. Cardiovascular:     Rate and Rhythm: Normal rate and regular rhythm.  Pulmonary:     Effort: Pulmonary effort is normal.     Breath sounds: Normal breath sounds.  Musculoskeletal:     Cervical back: Neck supple.  Neurological:  Mental Status: She is alert.  Psychiatric:        Mood and Affect: Mood normal.        Behavior: Behavior normal.     ------------------------------------------------------------------------------------------------------------------------------------------------------------------------------------------------------------------- Assessment and Plan  Essential hypertension   She does have some hypotension so valsartan was discontinued previously.  Blood pressure still borderline.  Encouraged hydration.  GAD (generalized anxiety disorder) She continues to struggle with anxiety.  Continue Lexapro at current strength.  Continue BuSpar at current strength.  Clonazepam renewed.  Encouraged to use this sparingly.  Disturbance of skin sensation She does have some numbing tingling sensations in her feet.  Update labs to look for metabolic causes.  Dizziness Recurrent episodes of dizziness.  Has had ENT evaluation.  Has upcoming MRI.  Adding triazolam prior to MRI.  She does have a driver.   Meds ordered this encounter  Medications   triazolam (HALCION) 0.25 MG tablet    Sig: Take 1 tablet (0.25 mg total) by  mouth once for 1 dose. Take 1 hour prior to MRI    Dispense:  1 tablet    Refill:  0   clonazePAM (KLONOPIN) 0.5 MG tablet    Sig: Take 0.5 tablets (0.25 mg total) by mouth daily as needed for anxiety.    Dispense:  30 tablet    Refill:  2    No follow-ups on file.    This visit occurred during the SARS-CoV-2 public health emergency.  Safety protocols were in place, including screening questions prior to the visit, additional usage of staff PPE, and extensive cleaning of exam room while observing appropriate contact time as indicated for disinfecting solutions.

## 2022-11-01 NOTE — Patient Instructions (Signed)
Take halcion 0.25mg  1 hour prior to MRI.

## 2022-11-01 NOTE — Assessment & Plan Note (Signed)
She continues to struggle with anxiety.  Continue Lexapro at current strength.  Continue BuSpar at current strength.  Clonazepam renewed.  Encouraged to use this sparingly.

## 2022-11-01 NOTE — Assessment & Plan Note (Signed)
   She does have some hypotension so valsartan was discontinued previously.  Blood pressure still borderline.  Encouraged hydration.

## 2022-11-03 ENCOUNTER — Ambulatory Visit
Admission: RE | Admit: 2022-11-03 | Discharge: 2022-11-03 | Disposition: A | Discharge status: Home / Self Care | Payer: Medicare Other | Source: Ambulatory Visit | Attending: Otolaryngology | Admitting: Otolaryngology

## 2022-11-03 DIAGNOSIS — R42 Dizziness and giddiness: Secondary | ICD-10-CM | POA: Diagnosis not present

## 2022-11-03 DIAGNOSIS — H9190 Unspecified hearing loss, unspecified ear: Secondary | ICD-10-CM | POA: Diagnosis not present

## 2022-11-03 DIAGNOSIS — H918X3 Other specified hearing loss, bilateral: Secondary | ICD-10-CM

## 2022-11-03 MED ORDER — GADOPICLENOL 0.5 MMOL/ML IV SOLN
6.0000 mL | Freq: Once | INTRAVENOUS | Status: AC | PRN
  Administered 2022-11-03: 6 mL via INTRAVENOUS

## 2022-11-06 ENCOUNTER — Encounter: Payer: Self-pay | Admitting: Physical Therapy

## 2022-11-06 ENCOUNTER — Ambulatory Visit: Payer: Medicare Other | Attending: Family Medicine | Admitting: Physical Therapy

## 2022-11-06 DIAGNOSIS — R42 Dizziness and giddiness: Secondary | ICD-10-CM

## 2022-11-06 DIAGNOSIS — R2681 Unsteadiness on feet: Secondary | ICD-10-CM

## 2022-11-06 NOTE — Therapy (Signed)
OUTPATIENT PHYSICAL THERAPY VESTIBULAR TREATMENT     Patient Name: Tamara Pope MRN: [id number] DOB:1945-06-15, 77 y.o., female Today's Date: 11/06/2022  END OF SESSION:  PT End of Session - 11/06/22 0719     Visit Number 2    Number of Visits 9    Date for PT Re-Evaluation 12/30/22   due to potential delay in scheduling   Authorization Type Medicare    PT Start Time 0717    PT Stop Time 0759    PT Time Calculation (min) 42 min    Equipment Utilized During Treatment Gait belt    Activity Tolerance Patient tolerated treatment well    Behavior During Therapy WFL for tasks assessed/performed             Past Medical History:  Diagnosis Date   Allergy    Anxiety 1990   Arthritis    Dysphonia, spasmodic    Essential hypertension 02/08/2021   GERD (gastroesophageal reflux disease)    History of DVT (deep vein thrombosis)    JAN 2002-- S/P BREAST IMPLANTS  RIGHT UPPER ARM (AXILLARY/ SUBCLAVIN VEIN)   HLD (hyperlipidemia) 01/03/2018   PMB (postmenopausal bleeding)    Past Surgical History:  Procedure Laterality Date   APPENDECTOMY  AS  CHILD   AUGMENTATION MAMMAPLASTY Bilateral    20 years ago   BILATERAL BREAST LIFT AND IMPLANT REDUCTION  12/2012   BREAST ENHANCEMENT SURGERY Bilateral 02/2000   CATARACT EXTRACTION W/ INTRAOCULAR LENS  IMPLANT, BILATERAL     COSMETIC SURGERY  1995   breast implants   DILATATION & CURRETTAGE/HYSTEROSCOPY WITH RESECTOCOPE N/A 04/18/2013   Procedure: DILATATION & CURETTAGE/HYSTEROSCOPY ;  Surgeon: Meriel Pica, MD;  Location: [facility] [place] [facility];  Service: Gynecology;  Laterality: N/A;   EYE SURGERY  2015   cataracts   JOINT REPLACEMENT  2014   Knee replacement   KNEE ARTHROSCOPY W/ MENISCECTOMY Right 2011   ROTATOR CUFF REPAIR Right 2005   skin surgery     TONSILLECTOMY  AS CHILD   TOTAL KNEE ARTHROPLASTY Right 05/03/2015   Procedure: RIGHT TOTAL KNEE ARTHROPLASTY;  Surgeon: Dannielle Huh, MD;  Location: [facility] OR;   Service: Orthopedics;  Laterality: Right;   Patient Active Problem List   Diagnosis Date Noted   Disturbance of skin sensation 11/01/2022   Tinnitus 11/01/2022   Night sweats 05/28/2022   Palpitations 09/01/2021   Essential hypertension 02/08/2021   Osteopenia 09/21/2020   Wrist fracture 03/21/2019   History of 2019 novel coronavirus disease (COVID-19) 02/15/2019   Vitamin D deficiency 02/15/2019   Spastic dysphonia 12/10/2018   Dizziness 09/24/2018   HLD (hyperlipidemia) 01/03/2018   Hyperglycemia 01/03/2018   Spasmodic dysphonia 10/11/2017   Allergic rhinitis 10/11/2017   GAD (generalized anxiety disorder) 10/11/2017   Laryngospasms 02/27/2017   Arthritis of midfoot 05/04/2016   S/P total knee arthroplasty 05/03/2015   Minor opacity of both corneas 08/16/2014   Osteoarthritis of knee 12/21/2011   Tear of medial meniscus of knee, current 12/21/2011   Status post cataract extraction and insertion of intraocular lens 11/14/2011   Status post LASIK surgery 10/11/2011    PCP: Everrett Coombe, DO   REFERRING PROVIDER:  Newman Pies, MD  REFERRING DIAG: R42 (ICD-10-CM) - Dizziness   THERAPY DIAG:  Dizziness and giddiness  Unsteadiness on feet  ONSET DATE: 10/23/2022   Rationale for Evaluation and Treatment: Rehabilitation  SUBJECTIVE:   SUBJECTIVE STATEMENT: Reports dizziness is worse in the morning and gets better as the day goes  on. Had an MRI on Friday and everything was normal. Took Meclizine this morning.  Still having buzzing in her ears.   Pt accompanied by:  Levon Hedger   PERTINENT HISTORY: PMH: Anxiety, HTN, HLD  Recurrent dizziness, symptomatic >6 months, dizziness is more light headed and off balance.   Per Dr. Suszanne Conners: recurrent dizziness, likely secondary to central vestibular dysfunction  PAIN:  Are you having pain? No  There were no vitals filed for this visit.    PRECAUTIONS: None  WEIGHT BEARING RESTRICTIONS: No  FALLS: Has patient fallen in  last 6 months? No  PLOF: Independent and Leisure: getting back to being more active. Enjoys working out with weights 2x a week and doing a stretch class   PATIENT GOALS: Improve dizziness   OBJECTIVE:   DIAGNOSTIC FINDINGS: MRI brain 11/03/22: IMPRESSION: 1. No evidence of an acute intracranial abnormality. 2. No cerebellopontine angle or internal auditory canal mass. 3. No specific etiology of hearing loss or tinnitus is identified. 4. Moderate chronic small vessel ischemic changes within the cerebral white matter.      COGNITION: Overall cognitive status: Within functional limits for tasks assessed    GAIT: Gait pattern: step through pattern Distance walked: Clinic distances  Assistive device utilized: None Level of assistance: Modified independence Comments: No unsteadiness noted during ambulation during clinic    PATIENT SURVEYS:  FOTO DFS: 56, DPS: 66.7    VESTIBULAR TREATMENT:     Sensory Organization Test performed with following results: Conditions: 1: 3 trials WNL 2: 3 trials WNL 3: 2 trials WNL, 1 below 4: 3 below normal  5: 3 falls  6: 3 falls  Composite score: 43 (below normal)  Sensory Analysis Som: Above normal  Vis: Below normal   Vest: Significantly below normal  Pref: Above normal   Strategy analysis: Ankle > hip strategy       COG alignment: Posteriorly and to the L                                                                                                     VESTIBULAR - OCULAR REFLEX:   Dynamic Visual Acuity: Static: Line 8 Dynamic: Line 7   Pt reporting no incr in symptoms   Access Code: [email or web address] URL: [email or web address] Date: 11/06/2022  Prepared by: Sherlie Ban  Initiated HEP, see MedBridge for more details   Exercises - Romberg Stance Eyes Closed on Foam Pad  - 1-2 x daily - 5 x weekly - 3 sets - 30 hold - with some space between feet  - Wide Stance with Eyes Closed on Foam Pad  - 1-2 x daily -  5 x weekly - 2 sets - 10 reps - 10 reps head turns, 10 reps head nods   PATIENT EDUCATION: Education details: Initial HEP, results of SOT and DVA, not taking Meclizine before next session  Person educated: Patient and Spouse Education method: Explanation, Demonstration, Verbal cues, and Handouts Education comprehension: verbalized understanding  HOME EXERCISE PROGRAM: Access Code: [email or web address] URL: [email or web address] Date: 11/06/2022 Prepared by: Sherlie Ban  Exercises - Romberg Stance Eyes  Closed on Foam Pad  - 1-2 x daily - 5 x weekly - 3 sets - 30 hold - with some space between feet  - Wide Stance with Eyes Closed on Foam Pad  - 1-2 x daily - 5 x weekly - 2 sets - 10 reps - 10 reps head turns, 10 reps head nods   GOALS: Goals reviewed with patient? Yes  SHORT TERM GOALS: ALL STGS = LTGS   LONG TERM GOALS: Target date: 11/28/2022  Pt will be independent with final HEP for balance/dizziness in order to build upon functional gains made in therapy.  Baseline:  Goal status: INITIAL  2.  Pt will improve composite score of SOT to WNL in order to demo improved vestibular and visual system for balance.  Baseline: 43 (below normal) Goal status: INITIAL  3.  DVA to be assessed with goal written as needed  Baseline:  Goal status: INITIAL  4.  Pt will improve FGA to at least a 29/30 in order to demo improvements with head motions and EC  Baseline:  Goal status: INITIAL  5.  Pt will improve DFS to at least 59 in order to demo improved functional outcomes related to dizziness.   Baseline: 56 Goal status: INITIAL    ASSESSMENT:  CLINICAL IMPRESSION: Assessed DVA today with pt only having a 1 line difference and pt reporting no incr in symptoms afterwards. Assessed SOT with pt with composite and visual sensory analysis below age related norms. Pt also with significantly decr vestibular input for balance (~2). See SOT section above for further information.  Remainder of session focused on initiating HEP for vestibular input. Will continue per POC.    OBJECTIVE IMPAIRMENTS: decreased activity tolerance, decreased balance, and dizziness.   ACTIVITY LIMITATIONS: locomotion level and looking up/down, head movements   PARTICIPATION LIMITATIONS: driving, community activity, and exercising   PERSONAL FACTORS: Age, Behavior pattern, Past/current experiences, Time since onset of injury/illness/exacerbation, and 1-2 comorbidities: Anxiety, HTN, HLD  are also affecting patient's functional outcome.   REHAB POTENTIAL: Good  CLINICAL DECISION MAKING: Evolving/moderate complexity  EVALUATION COMPLEXITY: Moderate   PLAN:  PT FREQUENCY: 2x/week  PT DURATION: 8 weeks  PLANNED INTERVENTIONS: Therapeutic exercises, Therapeutic activity, Neuromuscular re-education, Balance training, Gait training, Patient/Family education, Self Care, Joint mobilization, Vestibular training, Canalith repositioning, and Re-evaluation  PLAN FOR NEXT SESSION:  balance, VOR , head motions. Re-assess positional testing with pt not on meclizine or dramamine    Drake Leach, PT, DPT 11/06/2022, 12:35 PM

## 2022-11-07 ENCOUNTER — Other Ambulatory Visit (HOSPITAL_BASED_OUTPATIENT_CLINIC_OR_DEPARTMENT_OTHER): Payer: Self-pay | Admitting: Family

## 2022-11-07 NOTE — Telephone Encounter (Signed)
Rx request sent to pharmacy.  

## 2022-11-08 ENCOUNTER — Encounter: Payer: Self-pay | Admitting: Physical Therapy

## 2022-11-08 ENCOUNTER — Ambulatory Visit: Payer: Medicare Other | Admitting: Physical Therapy

## 2022-11-08 VITALS — BP 137/76 | HR 78

## 2022-11-08 DIAGNOSIS — R2681 Unsteadiness on feet: Secondary | ICD-10-CM

## 2022-11-08 DIAGNOSIS — R42 Dizziness and giddiness: Secondary | ICD-10-CM | POA: Diagnosis not present

## 2022-11-08 NOTE — Patient Instructions (Signed)
Gaze Stabilization: Sitting    Keeping eyes on target on wall a few feet away, tilt head down 15-30 and move head side to side for __30__ seconds. Repeat while moving head up and down for __30__ seconds.  Perform 2-3 reps of each.   Do __2__ sessions per day.   Gaze Stabilization: Tip Card  1.Target must remain in focus, not blurry, and appear stationary while head is in motion. 2.Perform exercises with small head movements (45 to either side of midline). 3.Increase speed of head motion so long as target is in focus. 4.If you wear eyeglasses, be sure you can see target through lens (therapist will give specific instructions for bifocal / progressive lenses). 5.These exercises may provoke dizziness or nausea. Work through these symptoms. If too dizzy, slow head movement slightly. Rest between each exercise. 6.Exercises demand concentration; avoid distractions. 7.For safety, perform standing exercises close to a counter, wall, corner, or next to someone.  Copyright  VHI. All rights reserved.

## 2022-11-08 NOTE — Therapy (Signed)
OUTPATIENT PHYSICAL THERAPY VESTIBULAR TREATMENT     Patient Name: Tamara Pope MRN: [id number] DOB:04-19-1945, 77 y.o., female Today's Date: 11/08/2022  END OF SESSION:  PT End of Session - 11/08/22 1404     Visit Number 3    Number of Visits 9    Date for PT Re-Evaluation 12/30/22   due to potential delay in scheduling   Authorization Type Medicare    PT Start Time 1401    PT Stop Time 1446    PT Time Calculation (min) 45 min    Equipment Utilized During Treatment Gait belt    Activity Tolerance Patient tolerated treatment well    Behavior During Therapy WFL for tasks assessed/performed             Past Medical History:  Diagnosis Date   Allergy    Anxiety 1990   Arthritis    Dysphonia, spasmodic    Essential hypertension 02/08/2021   GERD (gastroesophageal reflux disease)    History of DVT (deep vein thrombosis)    JAN 2002-- S/P BREAST IMPLANTS  RIGHT UPPER ARM (AXILLARY/ SUBCLAVIN VEIN)   HLD (hyperlipidemia) 01/03/2018   PMB (postmenopausal bleeding)    Past Surgical History:  Procedure Laterality Date   APPENDECTOMY  AS  CHILD   AUGMENTATION MAMMAPLASTY Bilateral    20 years ago   BILATERAL BREAST LIFT AND IMPLANT REDUCTION  12/2012   BREAST ENHANCEMENT SURGERY Bilateral 02/2000   CATARACT EXTRACTION W/ INTRAOCULAR LENS  IMPLANT, BILATERAL     COSMETIC SURGERY  1995   breast implants   DILATATION & CURRETTAGE/HYSTEROSCOPY WITH RESECTOCOPE N/A 04/18/2013   Procedure: DILATATION & CURETTAGE/HYSTEROSCOPY ;  Surgeon: Meriel Pica, MD;  Location: [facility] [place];  Service: Gynecology;  Laterality: N/A;   EYE SURGERY  2015   cataracts   JOINT REPLACEMENT  2014   Knee replacement   KNEE ARTHROSCOPY W/ MENISCECTOMY Right 2011   ROTATOR CUFF REPAIR Right 2005   skin surgery     TONSILLECTOMY  AS CHILD   TOTAL KNEE ARTHROPLASTY Right 05/03/2015   Procedure: RIGHT TOTAL KNEE ARTHROPLASTY;  Surgeon: Dannielle Huh, MD;  Location: [facility] OR;   Service: Orthopedics;  Laterality: Right;   Patient Active Problem List   Diagnosis Date Noted   Disturbance of skin sensation 11/01/2022   Tinnitus 11/01/2022   Night sweats 05/28/2022   Palpitations 09/01/2021   Essential hypertension 02/08/2021   Osteopenia 09/21/2020   Wrist fracture 03/21/2019   History of 2019 novel coronavirus disease (COVID-19) 02/15/2019   Vitamin D deficiency 02/15/2019   Spastic dysphonia 12/10/2018   Dizziness 09/24/2018   HLD (hyperlipidemia) 01/03/2018   Hyperglycemia 01/03/2018   Spasmodic dysphonia 10/11/2017   Allergic rhinitis 10/11/2017   GAD (generalized anxiety disorder) 10/11/2017   Laryngospasms 02/27/2017   Arthritis of midfoot 05/04/2016   S/P total knee arthroplasty 05/03/2015   Minor opacity of both corneas 08/16/2014   Osteoarthritis of knee 12/21/2011   Tear of medial meniscus of knee, current 12/21/2011   Status post cataract extraction and insertion of intraocular lens 11/14/2011   Status post LASIK surgery 10/11/2011    PCP: Everrett Coombe, DO   REFERRING PROVIDER:  Newman Pies, MD  REFERRING DIAG: R42 (ICD-10-CM) - Dizziness   THERAPY DIAG:  Dizziness and giddiness  Unsteadiness on feet  ONSET DATE: 10/23/2022   Rationale for Evaluation and Treatment: Rehabilitation  SUBJECTIVE:   SUBJECTIVE STATEMENT: Drove to therapy and took her Meclizine today. Feeling a little lightheaded today. Have  been trying her exercises with her eyes closed.   Pt accompanied by:  Self   PERTINENT HISTORY: PMH: Anxiety, HTN, HLD  Recurrent dizziness, symptomatic >6 months, dizziness is more light headed and off balance.   Per Dr. Suszanne Conners: recurrent dizziness, likely secondary to central vestibular dysfunction  PAIN:  Are you having pain? No  Vitals:   11/08/22 1409  BP: 137/76  Pulse: 78      PRECAUTIONS: None  WEIGHT BEARING RESTRICTIONS: No  FALLS: Has patient fallen in last 6 months? No  PLOF: Independent and Leisure:  getting back to being more active. Enjoys working out with weights 2x a week and doing a stretch class   PATIENT GOALS: Improve dizziness   OBJECTIVE:   DIAGNOSTIC FINDINGS: MRI brain 11/03/22: IMPRESSION: 1. No evidence of an acute intracranial abnormality. 2. No cerebellopontine angle or internal auditory canal mass. 3. No specific etiology of hearing loss or tinnitus is identified. 4. Moderate chronic small vessel ischemic changes within the cerebral white matter.      COGNITION: Overall cognitive status: Within functional limits for tasks assessed    GAIT: Gait pattern: step through pattern Distance walked: Clinic distances  Assistive device utilized: None Level of assistance: Modified independence Comments: No unsteadiness noted during ambulation during clinic    PATIENT SURVEYS:  FOTO DFS: 56, DPS: 66.7    VESTIBULAR TREATMENT:   DHI:      Gaze Adaptation: x1 Viewing Horizontal: Position: Seated, Time: 30 seconds, Reps: 3, and Comment: Initially tried in standing with pt reporting dizziness as 7/10. So went to a seated position, then pt reporting dizziness as 6/10. Cued for slower pace with pt able to tolerate better with less dizziness. More difficulty with keeping eyes on target when going to the L  x1 Viewing Vertical:  Position: Seated, Time: 30 seconds, Reps: 2, and Comment: 5/10 dizziness, not as symptomatic as side to side                                      Gait with head turns Gait with head nods     PATIENT EDUCATION: Education details: Initial HEP, results of SOT and DVA, not taking Meclizine before next session  Person educated: Patient and Spouse Education method: Explanation, Demonstration, Verbal cues, and Handouts Education comprehension: verbalized understanding  HOME EXERCISE PROGRAM: Access Code: [email or web address] URL: [email or web address] Date: 11/06/2022 Prepared by: Sherlie Ban  Exercises - Romberg Stance Eyes Closed  on Foam Pad  - 1-2 x daily - 5 x weekly - 3 sets - 30 hold - with some space between feet  - Wide Stance with Eyes Closed on Foam Pad  - 1-2 x daily - 5 x weekly - 2 sets - 10 reps - 10 reps head turns, 10 reps head nods  - Walking with Head Rotation  - 1-2 x daily - 5 x weekly - 3 sets  GOALS: Goals reviewed with patient? Yes  SHORT TERM GOALS: ALL STGS = LTGS   LONG TERM GOALS: Target date: 11/28/2022  Pt will be independent with final HEP for balance/dizziness in order to build upon functional gains made in therapy.  Baseline:  Goal status: INITIAL  2.  Pt will improve composite score of SOT to WNL in order to demo improved vestibular and visual system for balance.  Baseline: 43 (below normal) Goal status: INITIAL  3.  DVA to be assessed with goal  written as needed  Baseline: 1 line difference, goal not needed  Goal status: N/A  4.  Pt will improve FGA to at least a 29/30 in order to demo improvements with head motions and EC  Baseline:  Goal status: INITIAL  5.  Pt will improve DFS to at least 59 in order to demo improved functional outcomes related to dizziness.   Baseline: 56 Goal status: INITIAL    ASSESSMENT:  CLINICAL IMPRESSION: Assessed DVA today with pt only having a 1 line difference and pt reporting no incr in symptoms afterwards. Assessed SOT with pt with composite and visual sensory analysis below age related norms. Pt also with significantly decr vestibular input for balance (~2). See SOT section above for further information. Remainder of session focused on initiating HEP for vestibular input. Will continue per POC.    OBJECTIVE IMPAIRMENTS: decreased activity tolerance, decreased balance, and dizziness.   ACTIVITY LIMITATIONS: locomotion level and looking up/down, head movements   PARTICIPATION LIMITATIONS: driving, community activity, and exercising   PERSONAL FACTORS: Age, Behavior pattern, Past/current experiences, Time since onset of  injury/illness/exacerbation, and 1-2 comorbidities: Anxiety, HTN, HLD  are also affecting patient's functional outcome.   REHAB POTENTIAL: Good  CLINICAL DECISION MAKING: Evolving/moderate complexity  EVALUATION COMPLEXITY: Moderate   PLAN:  PT FREQUENCY: 2x/week  PT DURATION: 8 weeks  PLANNED INTERVENTIONS: Therapeutic exercises, Therapeutic activity, Neuromuscular re-education, Balance training, Gait training, Patient/Family education, Self Care, Joint mobilization, Vestibular training, Canalith repositioning, and Re-evaluation  PLAN FOR NEXT SESSION:  balance, VOR , head motions. Re-assess positional testing with pt not on meclizine or dramamine    Drake Leach, PT, DPT 11/08/2022, 2:48 PM

## 2022-11-10 ENCOUNTER — Other Ambulatory Visit: Payer: Self-pay | Admitting: Family Medicine

## 2022-11-14 ENCOUNTER — Ambulatory Visit: Payer: Medicare Other | Admitting: Physical Therapy

## 2022-11-14 DIAGNOSIS — R42 Dizziness and giddiness: Secondary | ICD-10-CM

## 2022-11-14 DIAGNOSIS — R2681 Unsteadiness on feet: Secondary | ICD-10-CM

## 2022-11-14 NOTE — Therapy (Unsigned)
OUTPATIENT PHYSICAL THERAPY VESTIBULAR TREATMENT     Patient Name: Tamara Pope MRN: [id number] DOB:09-24-1945, 77 y.o., female Today's Date: 11/15/2022  END OF SESSION:  PT End of Session - 11/15/22 1839     Visit Number 4    Number of Visits 9    Date for PT Re-Evaluation 12/30/22   due to potential delay in scheduling   Authorization Type Medicare    PT Start Time 1535    PT Stop Time 1615    PT Time Calculation (min) 40 min    Activity Tolerance Patient tolerated treatment well    Behavior During Therapy [facility] for tasks assessed/performed              Past Medical History:  Diagnosis Date   Allergy    Anxiety 1990   Arthritis    Dysphonia, spasmodic    Essential hypertension 02/08/2021   GERD (gastroesophageal reflux disease)    History of DVT (deep vein thrombosis)    JAN 2002-- S/P BREAST IMPLANTS  RIGHT UPPER ARM (AXILLARY/ SUBCLAVIN VEIN)   HLD (hyperlipidemia) 01/03/2018   PMB (postmenopausal bleeding)    Past Surgical History:  Procedure Laterality Date   APPENDECTOMY  AS  CHILD   AUGMENTATION MAMMAPLASTY Bilateral    20 years ago   BILATERAL BREAST LIFT AND IMPLANT REDUCTION  12/2012   BREAST ENHANCEMENT SURGERY Bilateral 02/2000   CATARACT EXTRACTION W/ INTRAOCULAR LENS  IMPLANT, BILATERAL     COSMETIC SURGERY  1995   breast implants   DILATATION & CURRETTAGE/HYSTEROSCOPY WITH RESECTOCOPE N/A 04/18/2013   Procedure: DILATATION & CURETTAGE/HYSTEROSCOPY ;  Surgeon: Meriel Pica, MD;  Location: [facility];  Service: Gynecology;  Laterality: N/A;   EYE SURGERY  2015   cataracts   JOINT REPLACEMENT  2014   Knee replacement   KNEE ARTHROSCOPY W/ MENISCECTOMY Right 2011   ROTATOR CUFF REPAIR Right 2005   skin surgery     TONSILLECTOMY  AS CHILD   TOTAL KNEE ARTHROPLASTY Right 05/03/2015   Procedure: RIGHT TOTAL KNEE ARTHROPLASTY;  Surgeon: Dannielle Huh, MD;  Location: [facility];  Service: Orthopedics;  Laterality: Right;    Patient Active Problem List   Diagnosis Date Noted   Disturbance of skin sensation 11/01/2022   Tinnitus 11/01/2022   Night sweats 05/28/2022   Palpitations 09/01/2021   Essential hypertension 02/08/2021   Osteopenia 09/21/2020   Wrist fracture 03/21/2019   History of 2019 novel coronavirus disease (COVID-19) 02/15/2019   Vitamin D deficiency 02/15/2019   Spastic dysphonia 12/10/2018   Dizziness 09/24/2018   HLD (hyperlipidemia) 01/03/2018   Hyperglycemia 01/03/2018   Spasmodic dysphonia 10/11/2017   Allergic rhinitis 10/11/2017   GAD (generalized anxiety disorder) 10/11/2017   Laryngospasms 02/27/2017   Arthritis of midfoot 05/04/2016   S/P total knee arthroplasty 05/03/2015   Minor opacity of both corneas 08/16/2014   Osteoarthritis of knee 12/21/2011   Tear of medial meniscus of knee, current 12/21/2011   Status post cataract extraction and insertion of intraocular lens 11/14/2011   Status post LASIK surgery 10/11/2011    PCP: Everrett Coombe, DO   REFERRING PROVIDER:  Newman Pies, MD  REFERRING DIAG: R42 (ICD-10-CM) - Dizziness   THERAPY DIAG:  Dizziness and giddiness  Unsteadiness on feet  ONSET DATE: 10/23/2022   Rationale for Evaluation and Treatment: Rehabilitation  SUBJECTIVE:   SUBJECTIVE STATEMENT: Pt has not had Meclizine in 2 days; pt states she feels light-headed in the mornings but it subsides after a couple  of hours  Pt accompanied by:  Levon Hedger  PERTINENT HISTORY: PMH: Anxiety, HTN, HLD  Recurrent dizziness, symptomatic >6 months, dizziness is more light headed and off balance.   Per Dr. Suszanne Conners: recurrent dizziness, likely secondary to central vestibular dysfunction  PAIN:  Are you having pain? No  There were no vitals filed for this visit.     PRECAUTIONS: None  WEIGHT BEARING RESTRICTIONS: No  FALLS: Has patient fallen in last 6 months? No  PLOF: Independent and Leisure: getting back to being more active. Enjoys working out  with weights 2x a week and doing a stretch class   PATIENT GOALS: Improve dizziness   OBJECTIVE:   DIAGNOSTIC FINDINGS: MRI brain 11/03/22: IMPRESSION: 1. No evidence of an acute intracranial abnormality. 2. No cerebellopontine angle or internal auditory canal mass. 3. No specific etiology of hearing loss or tinnitus is identified. 4. Moderate chronic small vessel ischemic changes within the cerebral white matter.      COGNITION: Overall cognitive status: Within functional limits for tasks assessed    GAIT: Gait pattern: step through pattern Distance walked: Clinic distances  Assistive device utilized: None Level of assistance: Modified independence Comments: No unsteadiness noted during ambulation during clinic    PATIENT SURVEYS:  FOTO DFS: 56, DPS: 66.7    VESTIBULAR TREATMENT:   NeuroRe-ed:  Positional testing - Rt and Lt sidelying tests (-) with no nystagmus and no c/o vertigo in test position  Rt Dix-Hallpike test (-) with no nystagmus and no c/o vertigo in test position Lt Dix-Hallpike test (-) with no nystagmus and no c/o vertigo in test position  Pt amb. With horizontal head turns 35' x 2 reps with mild unsteadiness and deviation in path - 2nd rep was improved compared to 1st rep Amb. With vertical head turns 35' x 2 reps with very min. Deviation in path - pt reported vertical head turns easier than horizontal head turns  Access Code: [email or web address] URL: [email or web address] Date: 11/15/2022 Prepared by: Maebelle Munroe  Exercises - Romberg Stance Eyes Closed on Foam Pad  - 1-2 x daily - 5 x weekly - 3 sets - 30 hold - Wide Stance with Eyes Closed on Foam Pad  - 1-2 x daily - 5 x weekly - 2 sets - 10 reps - Walking with Head Rotation  - 1-2 x daily - 5 x weekly - 3 sets  PATIENT EDUCATION: Education details: updated HEP  - see above Person educated: Patient Education method: Explanation, Demonstration, Verbal cues, and Handouts Education  comprehension: verbalized understanding, returned demonstration, and verbal cues required  HOME EXERCISE PROGRAM: Access Code: [email or web address] URL: [email or web address] Date: 11/06/2022 Prepared by: Sherlie Ban  Exercises - Romberg Stance Eyes Closed on Foam Pad  - 1-2 x daily - 5 x weekly - 3 sets - 30 hold - with some space between feet  - Wide Stance with Eyes Closed on Foam Pad  - 1-2 x daily - 5 x weekly - 2 sets - 10 reps - 10 reps head turns, 10 reps head nods  - Walking with Head Rotation  - 1-2 x daily - 5 x weekly - 3 sets  GOALS: Goals reviewed with patient? Yes  SHORT TERM GOALS: ALL STGS = LTGS   LONG TERM GOALS: Target date: 11/28/2022  Pt will be independent with final HEP for balance/dizziness in order to build upon functional gains made in therapy.  Baseline:  Goal status: INITIAL  2.  Pt will improve composite score of SOT to WNL  in order to demo improved vestibular and visual system for balance.  Baseline: 43 (below normal) Goal status: INITIAL  3.  DVA to be assessed with goal written as needed  Baseline: 1 line difference, goal not needed  Goal status: N/A  4.  Pt will improve FGA to at least a 29/30 in order to demo improvements with head motions and EC  Baseline:  Goal status: INITIAL  5.  Pt will improve DFS to at least 59 in order to demo improved functional outcomes related to dizziness.   Baseline: 56 Goal status: INITIAL    ASSESSMENT:  CLINICAL IMPRESSION: PT session focused on positional testing for BPPV as pt has not taken Meclizine in past 2 days.  All positional testing  (-) with no nystagmus and no c/o vertigo.  Pt appears to have vestibular hypofunction as evidenced by postural instability on compliant surface with head turns and also unsteadiness with amb. With horizontal head turns.  Continue per POC.    OBJECTIVE IMPAIRMENTS: decreased activity tolerance, decreased balance, and dizziness.   ACTIVITY LIMITATIONS:  locomotion level and looking up/down, head movements   PARTICIPATION LIMITATIONS: driving, community activity, and exercising   PERSONAL FACTORS: Age, Behavior pattern, Past/current experiences, Time since onset of injury/illness/exacerbation, and 1-2 comorbidities: Anxiety, HTN, HLD  are also affecting patient's functional outcome.   REHAB POTENTIAL: Good  CLINICAL DECISION MAKING: Evolving/moderate complexity  EVALUATION COMPLEXITY: Moderate   PLAN:  PT FREQUENCY: 2x/week  PT DURATION: 8 weeks  PLANNED INTERVENTIONS: Therapeutic exercises, Therapeutic activity, Neuromuscular re-education, Balance training, Gait training, Patient/Family education, Self Care, Joint mobilization, Vestibular training, Canalith repositioning, and Re-evaluation  PLAN FOR NEXT SESSION: check updated HEP balance, VOR , head motions, vestibular input for balance, EC     Shericka Johnstone, Donavan Burnet, PT 11/15/2022, 6:40 PM

## 2022-11-15 ENCOUNTER — Encounter: Payer: Self-pay | Admitting: Physical Therapy

## 2022-11-16 ENCOUNTER — Ambulatory Visit: Payer: Medicare Other | Admitting: Physical Therapy

## 2022-12-05 ENCOUNTER — Ambulatory Visit: Payer: Medicare Other | Attending: Family Medicine | Admitting: Physical Therapy

## 2022-12-05 DIAGNOSIS — R2681 Unsteadiness on feet: Secondary | ICD-10-CM | POA: Diagnosis not present

## 2022-12-05 DIAGNOSIS — R42 Dizziness and giddiness: Secondary | ICD-10-CM | POA: Diagnosis not present

## 2022-12-05 NOTE — Therapy (Signed)
OUTPATIENT PHYSICAL THERAPY VESTIBULAR TREATMENT/DISCHARGE SUMMARY     Patient Name: Tamara Pope MRN: [id number] DOB:05/20/1945, 77 y.o., female Today's Date: 12/06/2022  END OF SESSION:  PT End of Session - 12/06/22 1819     Visit Number 5    Number of Visits 9    Date for PT Re-Evaluation 12/30/22   due to potential delay in scheduling   Authorization Type Medicare    PT Start Time 1534    PT Stop Time 1610    PT Time Calculation (min) 36 min    Activity Tolerance Patient tolerated treatment well    Behavior During Therapy [facility] for tasks assessed/performed               Past Medical History:  Diagnosis Date   Allergy    Anxiety 1990   Arthritis    Dysphonia, spasmodic    Essential hypertension 02/08/2021   GERD (gastroesophageal reflux disease)    History of DVT (deep vein thrombosis)    JAN 2002-- S/P BREAST IMPLANTS  RIGHT UPPER ARM (AXILLARY/ SUBCLAVIN VEIN)   HLD (hyperlipidemia) 01/03/2018   PMB (postmenopausal bleeding)    Past Surgical History:  Procedure Laterality Date   APPENDECTOMY  AS  CHILD   AUGMENTATION MAMMAPLASTY Bilateral    20 years ago   BILATERAL BREAST LIFT AND IMPLANT REDUCTION  12/2012   BREAST ENHANCEMENT SURGERY Bilateral 02/2000   CATARACT EXTRACTION W/ INTRAOCULAR LENS  IMPLANT, BILATERAL     COSMETIC SURGERY  1995   breast implants   DILATATION & CURRETTAGE/HYSTEROSCOPY WITH RESECTOCOPE N/A 04/18/2013   Procedure: DILATATION & CURETTAGE/HYSTEROSCOPY ;  Surgeon: Meriel Pica, MD;  Location: [facility];  Service: Gynecology;  Laterality: N/A;   EYE SURGERY  2015   cataracts   JOINT REPLACEMENT  2014   Knee replacement   KNEE ARTHROSCOPY W/ MENISCECTOMY Right 2011   ROTATOR CUFF REPAIR Right 2005   skin surgery     TONSILLECTOMY  AS CHILD   TOTAL KNEE ARTHROPLASTY Right 05/03/2015   Procedure: RIGHT TOTAL KNEE ARTHROPLASTY;  Surgeon: Dannielle Huh, MD;  Location: [facility];  Service: Orthopedics;   Laterality: Right;   Patient Active Problem List   Diagnosis Date Noted   Disturbance of skin sensation 11/01/2022   Tinnitus 11/01/2022   Night sweats 05/28/2022   Palpitations 09/01/2021   Essential hypertension 02/08/2021   Osteopenia 09/21/2020   Wrist fracture 03/21/2019   History of 2019 novel coronavirus disease (COVID-19) 02/15/2019   Vitamin D deficiency 02/15/2019   Spastic dysphonia 12/10/2018   Dizziness 09/24/2018   HLD (hyperlipidemia) 01/03/2018   Hyperglycemia 01/03/2018   Spasmodic dysphonia 10/11/2017   Allergic rhinitis 10/11/2017   GAD (generalized anxiety disorder) 10/11/2017   Laryngospasms 02/27/2017   Arthritis of midfoot 05/04/2016   S/P total knee arthroplasty 05/03/2015   Minor opacity of both corneas 08/16/2014   Osteoarthritis of knee 12/21/2011   Tear of medial meniscus of knee, current 12/21/2011   Status post cataract extraction and insertion of intraocular lens 11/14/2011   Status post LASIK surgery 10/11/2011    PCP: Everrett Coombe, DO   REFERRING PROVIDER:  Newman Pies, MD  REFERRING DIAG: R42 (ICD-10-CM) - Dizziness   THERAPY DIAG:  Dizziness and giddiness  Unsteadiness on feet  ONSET DATE: 10/23/2022   Rationale for Evaluation and Treatment: Rehabilitation  SUBJECTIVE:   SUBJECTIVE STATEMENT: Pt reports she felt great on their trip (to [place] [facility]) - had no dizziness or symptoms; got  back in town Wed. Night - states the fullness started to occur again 2-3 days ago. Reports she took Meclizine 2 days ago, says she has a cold and allergies at this time. Feels like her head is full. Feels something on both sides of head, like pressure  Pt accompanied by:  Husband, Jim  PERTINENT HISTORY: PMH: Anxiety, HTN, HLD  Recurrent dizziness, symptomatic >6 months, dizziness is more light headed and off balance.   Per Dr. Suszanne Conners: recurrent dizziness, likely secondary to central vestibular dysfunction  PAIN:  Are you having  pain? No  There were no vitals filed for this visit.     PRECAUTIONS: None  WEIGHT BEARING RESTRICTIONS: No  FALLS: Has patient fallen in last 6 months? No  PLOF: Independent and Leisure: getting back to being more active. Enjoys working out with weights 2x a week and doing a stretch class   PATIENT GOALS: Improve dizziness   OBJECTIVE:   DIAGNOSTIC FINDINGS: MRI brain 11/03/22: IMPRESSION: 1. No evidence of an acute intracranial abnormality. 2. No cerebellopontine angle or internal auditory canal mass. 3. No specific etiology of hearing loss or tinnitus is identified. 4. Moderate chronic small vessel ischemic changes within the cerebral white matter.      COGNITION: Overall cognitive status: Within functional limits for tasks assessed    GAIT: Gait pattern: step through pattern Distance walked: Clinic distances  Assistive device utilized: None Level of assistance: Modified independence Comments: No unsteadiness noted during ambulation during clinic    PATIENT SURVEYS:  FOTO DFS: 56, DPS: 66.7    VESTIBULAR TREATMENT:   NeuroRe-ed:  Standing Balance: Surface: Airex Position: Narrow Base of Support Completed with: Eyes Open and Eyes Closed; Head Turns x 5 Reps and Head Nods x 5 Reps    SVA - line 8:  DVA line 8  Sensory Organization Test: Composite score 74/100; N= 70/100  Somatosensory, visual and vestibular WNL's  Condition 1 - all 3 trials WNL's Condition 2 - all 3 trials WNL's Condition 3 - trial 1 below N slightly:  trials 2 & 3 WNL's Condition 4 - all 3 trials WNL's Condition 5 - all 3 trials WNL's Condition 6 - all 3 trials WNL's  Access Code: [email or web address] URL: [email or web address] Date: 11/15/2022 Prepared by: Maebelle Munroe  Exercises - Romberg Stance Eyes Closed on Foam Pad  - 1-2 x daily - 5 x weekly - 3 sets - 30 hold - Wide Stance with Eyes Closed on Foam Pad  - 1-2 x daily - 5 x weekly - 2 sets - 10 reps - Walking with  Head Rotation  - 1-2 x daily - 5 x weekly - 3 sets  PATIENT EDUCATION: Education details: updated HEP  - see above Person educated: Patient Education method: Explanation, Demonstration, Verbal cues, and Handouts Education comprehension: verbalized understanding, returned demonstration, and verbal cues required  HOME EXERCISE PROGRAM: Access Code: [email or web address] URL: [email or web address] Date: 11/06/2022 Prepared by: Sherlie Ban  Exercises - Romberg Stance Eyes Closed on Foam Pad  - 1-2 x daily - 5 x weekly - 3 sets - 30 hold - with some space between feet  - Wide Stance with Eyes Closed on Foam Pad  - 1-2 x daily - 5 x weekly - 2 sets - 10 reps - 10 reps head turns, 10 reps head nods  - Walking with Head Rotation  - 1-2 x daily - 5 x weekly - 3 sets  GOALS: Goals reviewed with patient? Yes  SHORT TERM GOALS: ALL STGS =  LTGS   LONG TERM GOALS: Target date: 11/28/2022  Pt will be independent with final HEP for balance/dizziness in order to build upon functional gains made in therapy.  Baseline:  Goal status: Goal met 12-05-22  2.  Pt will improve composite score of SOT to WNL in order to demo improved vestibular and visual system for balance.  Baseline: 43 (below normal); 74/100 on 12-05-22 (WNL's) Goal status: Goal met 12-05-22  3.  DVA to be assessed with goal written as needed  Baseline: 1 line difference, goal not needed  Goal status: N/A  4.  Pt will improve FGA to at least a 29/30 in order to demo improvements with head motions and EC  Baseline:  Goal status: Deferred - pt reports no problems with balance at this time  5.  Pt will improve DFS to at least 59 in order to demo improved functional outcomes related to dizziness.   Baseline: 56 Goal status: score 67 - Goal met 12-05-22    ASSESSMENT:  CLINICAL IMPRESSION: Pt has met LTG's #1, 2, and 5:  LTG's #3 and 4 were deferred due to being WNL's.  Pt requests to be placed on hold for 1 month in order to  return if needed, should dizziness re-occur.  Pt will be discharged if symptoms remain resolved during next 30 days.     OBJECTIVE IMPAIRMENTS: decreased activity tolerance, decreased balance, and dizziness.   ACTIVITY LIMITATIONS: locomotion level and looking up/down, head movements   PARTICIPATION LIMITATIONS: driving, community activity, and exercising   PERSONAL FACTORS: Age, Behavior pattern, Past/current experiences, Time since onset of injury/illness/exacerbation, and 1-2 comorbidities: Anxiety, HTN, HLD  are also affecting patient's functional outcome.   REHAB POTENTIAL: Good  CLINICAL DECISION MAKING: Evolving/moderate complexity  EVALUATION COMPLEXITY: Moderate   PLAN:  PT FREQUENCY: 2x/week  PT DURATION: 8 weeks  PLANNED INTERVENTIONS: Therapeutic exercises, Therapeutic activity, Neuromuscular re-education, Balance training, Gait training, Patient/Family education, Self Care, Joint mobilization, Vestibular training, Canalith repositioning, and Re-evaluation  PLAN FOR NEXT SESSION: hold for 30 days - D/C if pt does not need follow up pending re-occurrence of symptoms   Chantal Worthey, Derrie Romaine, PT 12/06/2022, 6:21 PM

## 2022-12-06 ENCOUNTER — Encounter: Payer: Self-pay | Admitting: Physical Therapy

## 2022-12-06 ENCOUNTER — Other Ambulatory Visit: Payer: Self-pay | Admitting: Family Medicine

## 2022-12-07 ENCOUNTER — Encounter: Payer: Medicare Other | Admitting: Physical Therapy

## 2022-12-12 ENCOUNTER — Encounter: Payer: Medicare Other | Admitting: Physical Therapy

## 2022-12-14 ENCOUNTER — Encounter: Payer: Medicare Other | Admitting: Physical Therapy

## 2022-12-14 ENCOUNTER — Encounter (HOSPITAL_BASED_OUTPATIENT_CLINIC_OR_DEPARTMENT_OTHER): Payer: Medicare Other | Admitting: Family

## 2022-12-14 NOTE — Progress Notes (Deleted)
Advanced Hypertension Assessment:    Date:  12/14/2022   ID:  Tamara Pope, DOB 06-20-1945, MRN [id number]  PCP:  Everrett Coombe, DO  Cardiologist:  None  Nephrologist:  Referring MD: Everrett Coombe, DO   CC: Hypertension  History of Present Illness:    Tamara Pope is a 77 y.o. female with a hx of hypertension, anxiety, arthritis, GERD, DVT (04/2020 in the setting of breast implant), spasmodic dysphonia here for follow up of hypertension.  Family history notable for maternal grandmother had heart attack and CAD.  Established with [facility] 08/30/21. She was diagnosed with hypertension three years prior with BP labile based on stress. PCP had trialed Propranolol for BP and anxiety benefit but she did not tolerate as felt 'jittery', later started on Valsartan. 14 day ZIO placed due to palpations revealed NSR, 31 episodes of atrial tachycardia up to 11 beats, rare PVC/PAC. BMP, TSH were unremarkable. She was started on Metoprolol and had visit with the EP who felt her rare PVC/PACs were predominantly related to anxiety. Valsartan was later reduced to 20mg  daily due to lightheadedness.   Last seen 07/05/2022 with chief complaint of anxiety and subsequent fluctuating BP.  24-hour BP monitor 06/2022 revealed that BP overall controlled with overall average BP 126/62 (awake average 136/66, asleep average 96/46).  Noted to be higher during the day and very low at night with presumed anxiety contributory.  She is recommended to start taking metoprolol and valsartan in the morning.  She was planning to reengage with her therapist and seek out psychiatrist.  She presents today for follow up with her husband. ***  She notes her bilateral lower extremities have been tingling particularly at night. It is worse at night. Notes her feet are "ice cold". No pain with ambulation. She tried heat which helped some. No swelling to her lower extremities. Notes her toes in the  evening will look much lighter.    Previous antihypertensives: Propranolol - felt jittery  Past Medical History:  Diagnosis Date   Allergy    Anxiety 1990   Arthritis    Dysphonia, spasmodic    Essential hypertension 02/08/2021   GERD (gastroesophageal reflux disease)    History of DVT (deep vein thrombosis)    JAN 2002-- S/P BREAST IMPLANTS  RIGHT UPPER ARM (AXILLARY/ SUBCLAVIN VEIN)   HLD (hyperlipidemia) 01/03/2018   PMB (postmenopausal bleeding)     Past Surgical History:  Procedure Laterality Date   APPENDECTOMY  AS  CHILD   AUGMENTATION MAMMAPLASTY Bilateral    20 years ago   BILATERAL BREAST LIFT AND IMPLANT REDUCTION  12/2012   BREAST ENHANCEMENT SURGERY Bilateral 02/2000   CATARACT EXTRACTION W/ INTRAOCULAR LENS  IMPLANT, BILATERAL     COSMETIC SURGERY  1995   breast implants   DILATATION & CURRETTAGE/HYSTEROSCOPY WITH RESECTOCOPE N/A 04/18/2013   Procedure: DILATATION & CURETTAGE/HYSTEROSCOPY ;  Surgeon: Meriel Pica, MD;  Location: [facility];  Service: Gynecology;  Laterality: N/A;   EYE SURGERY  2015   cataracts   JOINT REPLACEMENT  2014   Knee replacement   KNEE ARTHROSCOPY W/ MENISCECTOMY Right 2011   ROTATOR CUFF REPAIR Right 2005   skin surgery     TONSILLECTOMY  AS CHILD   TOTAL KNEE ARTHROPLASTY Right 05/03/2015   Procedure: RIGHT TOTAL KNEE ARTHROPLASTY;  Surgeon: Dannielle Huh, MD;  Location: [facility];  Service: Orthopedics;  Laterality: Right;    Current Medications: No outpatient medications have been  marked as taking for the 12/14/22 encounter (Appointment) with Alver Sorrow, NP.     Allergies:   Bee venom and Wasp venom   Social History   Socioeconomic History   Marital status: Married    Spouse name: Tamara Pope   Number of children: 2   Years of education: 14   Highest education level: Associate degree: occupational, Scientist, product/process development, or vocational program  Occupational History    Employer: [employer]    Occupation: Retired  Tobacco Use   Smoking status: Never   Smokeless tobacco: Never   Tobacco comments:    None  Vaping Use   Vaping status: Never Used  Substance and Sexual Activity   Alcohol use: Yes    Alcohol/week: 4.0 standard drinks of alcohol    Types: 4 Glasses of wine per week    Comment: 4 glasses of wine a week   Drug use: No   Sexual activity: Not Currently    Partners: Male  Other Topics Concern   Not on file  Social History Narrative   Lives with her husband. She has two daughters. She enjoys dancing and going out to different restaurants.   Social Determinants of Health   Financial Resource Strain: Low Risk  (06/25/2022)   Overall Financial Resource Strain (CARDIA)    Difficulty of Paying Living Expenses: Not hard at all  Food Insecurity: No Food Insecurity (07/27/2022)   Hunger Vital Sign    Worried About Running Out of Food in the Last Year: Never true    Ran Out of Food in the Last Year: Never true  Transportation Needs: No Transportation Needs (07/27/2022)   PRAPARE - Administrator, Civil Service (Medical): No    Lack of Transportation (Non-Medical): No  Physical Activity: Insufficiently Active (07/27/2022)   Exercise Vital Sign    Days of Exercise per Week: 2 days    Minutes of Exercise per Session: 50 min  Stress: No Stress Concern Present (07/27/2022)   [employer] of Occupational Health - Occupational Stress Questionnaire    Feeling of Stress : Only a little  Social Connections: Socially Integrated (07/27/2022)   Social Connection and Isolation Panel [NHANES]    Frequency of Communication with Friends and Family: More than three times a week    Frequency of Social Gatherings with Friends and Family: Three times a week    Attends Religious Services: More than 4 times per year    Active Member of Clubs or Organizations: Yes    Attends Engineer, structural: More than 4 times per year    Marital Status: Married     Family  History: The patient's family history includes Arthritis in her maternal grandmother and mother; Birth defects in her brother; Cancer in her father; Diabetes in her brother; Heart attack in her paternal grandmother; Heart disease in her maternal grandmother; Hypertension in her mother; Lung cancer in her father. There is no history of Colon cancer or Stomach cancer.  ROS:   Please see the history of present illness.     All other systems reviewed and are negative.  EKGs/Labs/Other Studies Reviewed:         ZIO 08/2021 Quality: Fair.  Baseline artifact. Predominant rhythm:sinus rhythm Average heart rate: 82 bpm Max heart rate: 137 bpm in sinus rhythm.  176 bpm in atrial tachycardia Min heart rate: 53 bpm Pauses >2.5 seconds: none   Rare PACs and PVCs 31 episodes of atrial tachycardia up to 11 beats.  Fastest rate 176 bpm   Recent Labs: 05/26/2022: ALT 20; BUN 20; Creat 0.60; Hemoglobin 13.8; Platelets 313; Potassium 4.6; Sodium 137; TSH 1.63   Recent Lipid Panel    Component Value Date/Time   CHOL 260 (H) 09/19/2021 0000   TRIG 107 09/19/2021 0000   HDL 115 09/19/2021 0000   CHOLHDL 2.3 09/19/2021 0000   VLDL 28.8 02/18/2019 1116   LDLCALC 124 (H) 09/19/2021 0000    Physical Exam:   VS:  There were no vitals taken for this visit. , BMI There is no height or weight on file to calculate BMI. GENERAL:  Well appearing HEENT: Pupils equal round and reactive, fundi not visualized, oral mucosa unremarkable NECK:  No jugular venous distention, waveform within normal limits, carotid upstroke brisk and symmetric, no bruits, no thyromegaly LYMPHATICS:  No cervical adenopathy LUNGS:  Clear to auscultation bilaterally HEART:  RRR.  PMI not displaced or sustained,S1 and S2 within normal limits, no S3, no S4, no clicks, no rubs,  murmurs ABD:  Flat, positive bowel sounds normal in frequency in pitch, no bruits, no rebound, no guarding, no midline pulsatile mass, no hepatomegaly, no  splenomegaly EXT:  2 plus pulses throughout, no edema, no cyanosis no clubbing SKIN:  No rashes no nodules NEURO:  Cranial nerves II through XII grossly intact, motor grossly intact throughout PSYCH:  Cognitively intact, oriented to person place and time  ASSESSMENT/PLAN:    Lightheadedness / Dizziness - 2 week history of lightheadedness and dizziness.  Note sometimes feels lightheaded and sometimes feels as if she is spinning.  Encouraged to trial OTC meclizine 25 mg TID PRN.  She has had some hypotensive readings at home and has inadvertently been taking a whole tablet of valsartan instead of half, she will again reduce to valsartan 20 mg daily. No hematuria, melena suggestive of bleeding. Plan for carotid duplex as dentist noted calcifications (no bruit on exam, no amaurosis fugax). If stenosis noted, will plan to add statin. ***  Leg numbness - Bilateral LE numbness with cold toes. Worse at night.  Strong 2+ DP pulses with no evidence of PAD. Reports no claudication. No indication for ABI. Consider etiology venous insufficiency for cold toes. Consider etiology neuropathy vs RLS for leg numbness, encouraged to discuss with PCP.***  HTN - BP has been labile often related to anxiety/stress. Did not tolerate Propranolol.  Valsartan previously reduced to 20 mg daily but has inadvertently been taking 40 mg for unknown amount of time - reduce Valsartan to 20mg  QD. Will order 24h BP monitor for better assessment. ***  Palpitations / PAT - Monitor 08/2021 with 31 episodes of atrial tachycardia (up to 11 beats, fastest rate 176 bpm). Quiescent on Metoprolol succinate 25mg  daily.***  Anxiety - Follows with primary care. ***  Spasmodic dysphonia -Continue to follow with PCP. ***  Screening for Secondary Hypertension:     08/30/2021    5:06 PM  Causes  Drugs/Herbals Screened  Sleep Apnea Screened     - Comments No snoring.  Thyroid Disease Screened     - Comments 02/2019 TSH 1.86. Update TSH  08/30/21.  Pheochromocytoma Screened     - Comments Notes palpitations, diaphoresis. Consider urine metanephrines at follow up.  Cushing's Syndrome Screened     - Comments No cushinoid appearance.  Coarctation of the Aorta Screened     - Comments R arm BP 163/84 and L arm 144/81.  Compliance Screened    Relevant Labs/Studies:    Latest Ref Rng &  Units 05/26/2022    1:20 PM 09/19/2021   12:00 AM 08/30/2021    3:10 PM  Basic Labs  Sodium 135 - 146 mmol/L 137  132  134   Potassium 3.5 - 5.3 mmol/L 4.6  4.0  5.1   Creatinine 0.60 - 1.00 mg/dL 4.40  3.47  4.25        Latest Ref Rng & Units 05/26/2022    1:20 PM 08/30/2021    3:10 PM  Thyroid   TSH 0.40 - 4.50 mIU/L 1.63  2.310      Disposition:    FU with Dr. Duke Salvia***   Medication Adjustments/Labs and Tests Ordered: Current medicines are reviewed at length with the patient today.  Concerns regarding medicines are outlined above.  No orders of the defined types were placed in this encounter.  No orders of the defined types were placed in this encounter.  Signed, Alver Sorrow, NP  12/14/2022 8:12 AM    [facility]

## 2022-12-20 DIAGNOSIS — J383 Other diseases of vocal cords: Secondary | ICD-10-CM | POA: Diagnosis not present

## 2022-12-20 DIAGNOSIS — J385 Laryngeal spasm: Secondary | ICD-10-CM | POA: Diagnosis not present

## 2022-12-20 DIAGNOSIS — R498 Other voice and resonance disorders: Secondary | ICD-10-CM | POA: Diagnosis not present

## 2022-12-20 DIAGNOSIS — R490 Dysphonia: Secondary | ICD-10-CM | POA: Diagnosis not present

## 2022-12-29 DIAGNOSIS — J385 Laryngeal spasm: Secondary | ICD-10-CM | POA: Diagnosis not present

## 2023-01-05 DIAGNOSIS — Z23 Encounter for immunization: Secondary | ICD-10-CM | POA: Diagnosis not present

## 2023-01-22 DIAGNOSIS — R498 Other voice and resonance disorders: Secondary | ICD-10-CM | POA: Diagnosis not present

## 2023-01-22 DIAGNOSIS — J385 Laryngeal spasm: Secondary | ICD-10-CM | POA: Diagnosis not present

## 2023-01-22 DIAGNOSIS — J383 Other diseases of vocal cords: Secondary | ICD-10-CM | POA: Diagnosis not present

## 2023-01-22 DIAGNOSIS — R490 Dysphonia: Secondary | ICD-10-CM | POA: Diagnosis not present

## 2023-02-08 ENCOUNTER — Ambulatory Visit (INDEPENDENT_AMBULATORY_CARE_PROVIDER_SITE_OTHER): Payer: Medicare Other | Admitting: Family

## 2023-02-08 ENCOUNTER — Encounter (HOSPITAL_BASED_OUTPATIENT_CLINIC_OR_DEPARTMENT_OTHER): Payer: Self-pay | Admitting: Family

## 2023-02-08 VITALS — BP 128/74 | HR 83 | Resp 18 | Ht 64.0 in | Wt 141.2 lb

## 2023-02-08 DIAGNOSIS — I4719 Other supraventricular tachycardia: Secondary | ICD-10-CM

## 2023-02-08 DIAGNOSIS — I10 Essential (primary) hypertension: Secondary | ICD-10-CM | POA: Diagnosis not present

## 2023-02-08 DIAGNOSIS — J383 Other diseases of vocal cords: Secondary | ICD-10-CM | POA: Diagnosis not present

## 2023-02-08 MED ORDER — METOPROLOL SUCCINATE ER 25 MG PO TB24: 12.5000 mg | ORAL_TABLET | Freq: Every day | ORAL | 1 refills | Status: DC

## 2023-02-08 NOTE — Progress Notes (Signed)
Advanced Hypertension Assessment:    Date:  02/08/2023   ID:  Tamara Pope, DOB 03-22-1946, MRN [id number]  PCP:  Tamara Coombe, DO  Cardiologist:  None  Nephrologist:  Referring MD: Tamara Coombe, DO   CC: Hypertension  History of Present Illness:    Tamara Pope is a 77 y.o. female with a hx of hypertension, anxiety, arthritis, GERD, DVT (04/2020 in the setting of breast implant), spasmodic dysphonia here for follow up of hypertension. Her maternal grandmother had heart attack and CAD.  Established with [facility] 08/30/21. She was diagnosed with hypertension three years prior with BP labile based on stress. PCP had trialed Propranolol for BP and anxiety benefit but she did not tolerate. Limited salt in diet and stayed active with personal trainer, stretch exercises, walking. Did not tolerate Propranolol as felt 'jittery' and was started on Valsartan. 14 day ZIO placed due to palpations revealed NSR, 31 episodes of atrial tachycardia up to 11 beats, rare PVC/PAC. BMP, TSH were unremarkable. She was started on Metoprolol and had visit with the EP who felt her rare PVC/PACs were predominantly related to anxiety. Valsartan was later reduced to 20mg  daily due to lightheadedness.   Last seen 07/05/22 with chief complaint of anxiety and subsequent fluctuation in blood pressures. She was referred to psychiatry at her request.  24h BP monitor 06/2022 revealed BP controlled on average. She was recommended to move Valsartan and Metoprolol to morning instead of at night. Via MyChart message 10/2022 Valsartan was discontinued due to hypotension and lightheadedness.   She presents today for follow up independently. Home SBP most often 110s-120s. Notes she would like to be off Buspar due to concerns about some memory changes as she feels she cannot recall things as easily. Encouraged to discuss with PCP. Reports no shortness of breath nor dyspnea on exertion. Reports  no chest pain, pressure, or tightness. No edema, orthopnea, PND. Reports no palpitations.    Previous antihypertensives: Propranolol - felt jittery Valsartan  Past Medical History:  Diagnosis Date   Allergy    Anxiety 1990   Arthritis    Dysphonia, spasmodic    Essential hypertension 02/08/2021   GERD (gastroesophageal reflux disease)    History of DVT (deep vein thrombosis)    JAN 2002-- S/P BREAST IMPLANTS  RIGHT UPPER ARM (AXILLARY/ SUBCLAVIN VEIN)   HLD (hyperlipidemia) 01/03/2018   PMB (postmenopausal bleeding)     Past Surgical History:  Procedure Laterality Date   APPENDECTOMY  AS  CHILD   AUGMENTATION MAMMAPLASTY Bilateral    20 years ago   BILATERAL BREAST LIFT AND IMPLANT REDUCTION  12/2012   BREAST ENHANCEMENT SURGERY Bilateral 02/2000   CATARACT EXTRACTION W/ INTRAOCULAR LENS  IMPLANT, BILATERAL     COSMETIC SURGERY  1995   breast implants   DILATATION & CURRETTAGE/HYSTEROSCOPY WITH RESECTOCOPE N/A 04/18/2013   Procedure: DILATATION & CURETTAGE/HYSTEROSCOPY ;  Surgeon: Meriel Pica, MD;  Location: [facility];  Service: Gynecology;  Laterality: N/A;   EYE SURGERY  2015   cataracts   JOINT REPLACEMENT  2014   Knee replacement   KNEE ARTHROSCOPY W/ MENISCECTOMY Right 2011   ROTATOR CUFF REPAIR Right 2005   skin surgery     TONSILLECTOMY  AS CHILD   TOTAL KNEE ARTHROPLASTY Right 05/03/2015   Procedure: RIGHT TOTAL KNEE ARTHROPLASTY;  Surgeon: Dannielle Huh, MD;  Location: [facility] OR;  Service: Orthopedics;  Laterality: Right;    Current Medications: Current Meds  Medication Sig   Biotin 5000 MCG TABS Take 1 tablet by mouth daily.   busPIRone (BUSPAR) 10 MG tablet TAKE 1 TABLET BY MOUTH TWICE A DAY   Calcium Carb-Cholecalciferol 600-800 MG-UNIT TABS Take 1 tablet by mouth 2 (two) times daily.   Cholecalciferol (VITAMIN D3) 2000 units TABS Take 1 tablet by mouth daily.   clonazePAM (KLONOPIN) 0.5 MG tablet Take 0.5 tablets (0.25 mg total) by  mouth daily as needed for anxiety.   escitalopram (LEXAPRO) 10 MG tablet TAKE 1.5 TABLETS BY MOUTH EVERY NIGHT AT BEDTIME   estradiol (ESTRACE) 0.1 MG/GM vaginal cream Place vaginally as needed.   fluticasone (FLONASE) 50 MCG/ACT nasal spray Place 1 spray into both nostrils daily as needed for allergies or rhinitis.   metoprolol succinate (TOPROL-XL) 25 MG 24 hr tablet Take 12.5 mg by mouth daily.   Probiotic Product (ALIGN) 4 MG CAPS Take 1 capsule by mouth daily.   traZODone (DESYREL) 50 MG tablet TAKE ONE TO TWO TABLETS BY MOUTH EVERY NIGHT AT BEDTIME AS NEEDED FOR SLEEP   [DISCONTINUED] metoprolol succinate (TOPROL XL) 25 MG 24 hr tablet Take 0.5 tablets (12.5 mg total) by mouth daily.   [DISCONTINUED] metoprolol succinate (TOPROL-XL) 25 MG 24 hr tablet TAKE 1 TABLET BY MOUTH DAILY     Allergies:   Bee venom and Wasp venom   Social History   Socioeconomic History   Marital status: Married    Spouse name: Nanda Bittick   Number of children: 2   Years of education: 14   Highest education level: Associate degree: occupational, Scientist, product/process development, or vocational program  Occupational History    Employer: [employer]   Occupation: Retired  Tobacco Use   Smoking status: Never   Smokeless tobacco: Never   Tobacco comments:    None  Vaping Use   Vaping status: Never Used  Substance and Sexual Activity   Alcohol use: Yes    Alcohol/week: 4.0 standard drinks of alcohol    Types: 4 Glasses of wine per week    Comment: 4 glasses of wine a week   Drug use: No   Sexual activity: Not Currently    Partners: Male  Other Topics Concern   Not on file  Social History Narrative   Lives with her husband. She has two daughters. She enjoys dancing and going out to different restaurants.   Social Determinants of Health   Financial Resource Strain: Low Risk  (06/25/2022)   Overall Financial Resource Strain (CARDIA)    Difficulty of Paying Living Expenses: Not hard at all  Food Insecurity:  No Food Insecurity (07/27/2022)   Hunger Vital Sign    Worried About Running Out of Food in the Last Year: Never true    Ran Out of Food in the Last Year: Never true  Transportation Needs: No Transportation Needs (07/27/2022)   PRAPARE - Administrator, Civil Service (Medical): No    Lack of Transportation (Non-Medical): No  Physical Activity: Insufficiently Active (07/27/2022)   Exercise Vital Sign    Days of Exercise per Week: 2 days    Minutes of Exercise per Session: 50 min  Stress: No Stress Concern Present (07/27/2022)   [employer] of Occupational Health - Occupational Stress Questionnaire    Feeling of Stress : Only a little  Social Connections: Socially Integrated (07/27/2022)   Social Connection and Isolation Panel [NHANES]    Frequency of Communication with Friends and Family: More than three times a week  Frequency of Social Gatherings with Friends and Family: Three times a week    Attends Religious Services: More than 4 times per year    Active Member of Clubs or Organizations: Yes    Attends Engineer, structural: More than 4 times per year    Marital Status: Married     Family History: The patient's family history includes Arthritis in her maternal grandmother and mother; Birth defects in her brother; Cancer in her father; Diabetes in her brother; Heart attack in her paternal grandmother; Heart disease in her maternal grandmother; Hypertension in her mother; Lung cancer in her father. There is no history of Colon cancer or Stomach cancer.  ROS:   Please see the history of present illness.     All other systems reviewed and are negative.  EKGs/Labs/Other Studies Reviewed:    EKG Interpretation Date/Time:  Thursday February 08 2023 11:04:29 EST Ventricular Rate:  70 PR Interval:  156 QRS Duration:  80 QT Interval:  390 QTC Calculation: 421 R Axis:   34  Text Interpretation: Normal sinus rhythm Normal ECG Artifact noted Confirmed by Gillian Shields ([id number]) on 02/08/2023 11:08:05 AM    ZIO 08/2021 Quality: Fair.  Baseline artifact. Predominant rhythm:sinus rhythm Average heart rate: 82 bpm Max heart rate: 137 bpm in sinus rhythm.  176 bpm in atrial tachycardia Min heart rate: 53 bpm Pauses >2.5 seconds: none   Rare PACs and PVCs 31 episodes of atrial tachycardia up to 11 beats.  Fastest rate 176 bpm   Recent Labs: 05/26/2022: ALT 20; BUN 20; Creat 0.60; Hemoglobin 13.8; Platelets 313; Potassium 4.6; Sodium 137; TSH 1.63   Recent Lipid Panel    Component Value Date/Time   CHOL 260 (H) 09/19/2021 0000   TRIG 107 09/19/2021 0000   HDL 115 09/19/2021 0000   CHOLHDL 2.3 09/19/2021 0000   VLDL 28.8 02/18/2019 1116   LDLCALC 124 (H) 09/19/2021 0000    Physical Exam:   VS:  BP 128/74 (BP Location: Right Arm, Patient Position: Sitting, Cuff Size: Normal)   Pulse 83   Resp 18   Ht 5\' 4"  (1.626 m)   Wt 141 lb 3.2 oz (64 kg)   SpO2 96%   BMI 24.24 kg/m  , BMI Body mass index is 24.24 kg/m. GENERAL:  Well appearing HEENT: Pupils equal round and reactive, fundi not visualized, oral mucosa unremarkable NECK:  No jugular venous distention, waveform within normal limits, carotid upstroke brisk and symmetric, no bruits, no thyromegaly LYMPHATICS:  No cervical adenopathy LUNGS:  Clear to auscultation bilaterally HEART:  RRR.  PMI not displaced or sustained,S1 and S2 within normal limits, no S3, no S4, no clicks, no rubs,  murmurs ABD:  Flat, positive bowel sounds normal in frequency in pitch, no bruits, no rebound, no guarding, no midline pulsatile mass, no hepatomegaly, no splenomegaly EXT:  2 plus pulses throughout, no edema, no cyanosis no clubbing SKIN:  No rashes no nodules NEURO:  Cranial nerves II through XII grossly intact, motor grossly intact throughout PSYCH:  Cognitively intact, oriented to person place and time  ASSESSMENT/PLAN:    HTN - BP has been labile often related to anxiety/stress. Did not tolerate  Propranolol as it made her feel "jittery".  Valsartan previously reduced and subsequently discontinued due to hypotension. BP at goal today, no indication for antihypertensive regimen. Metoprolol Succinate for palpitations, below.    Palpitations / PAT - Monitor 08/2021 with 31 episodes of atrial tachycardia (up to 11 beats, fastest rate  176 bpm). Quiescent on Metoprolol succinate 25mg  daily. SHe is interested in being on fewer medications, reduce Metoprolol succinate to 12.5mg  daily. If she has recurrent palpitations, will simply return to 25mg  dose.   Anxiety - Follows with primary care. She is interested in being off Buspar as having some short term recall difficulties which she is concerned are related to Buspar.   Spasmodic dysphonia -Continue to follow with [facility] and speech pathologist.   Screening for Secondary Hypertension:     08/30/2021    5:06 PM  Causes  Drugs/Herbals Screened  Sleep Apnea Screened     - Comments No snoring.  Thyroid Disease Screened     - Comments 02/2019 TSH 1.86. Update TSH 08/30/21.  Pheochromocytoma Screened     - Comments Notes palpitations, diaphoresis. Consider urine metanephrines at follow up.  Cushing's Syndrome Screened     - Comments No cushinoid appearance.  Coarctation of the Aorta Screened     - Comments R arm BP 163/84 and L arm 144/81.  Compliance Screened    Relevant Labs/Studies:    Latest Ref Rng & Units 05/26/2022    1:20 PM 09/19/2021   12:00 AM 08/30/2021    3:10 PM  Basic Labs  Sodium 135 - 146 mmol/L 137  132  134   Potassium 3.5 - 5.3 mmol/L 4.6  4.0  5.1   Creatinine 0.60 - 1.00 mg/dL 6.21  3.08  6.57        Latest Ref Rng & Units 05/26/2022    1:20 PM 08/30/2021    3:10 PM  Thyroid   TSH 0.40 - 4.50 mIU/L 1.63  2.310      Disposition:    FU in 6 months   Medication Adjustments/Labs and Tests Ordered: Current medicines are reviewed at length with the patient today.  Concerns regarding medicines are  outlined above.  Orders Placed This Encounter  Procedures   EKG 12-Lead   EKG 12-Lead   Meds ordered this encounter  Medications   DISCONTD: metoprolol succinate (TOPROL XL) 25 MG 24 hr tablet    Sig: Take 0.5 tablets (12.5 mg total) by mouth daily.    Dispense:  90 tablet    Refill:  1   Signed, Alver Sorrow, NP  02/08/2023 11:39 AM    [facility]

## 2023-02-08 NOTE — Patient Instructions (Addendum)
Medication Instructions:  Your physician has recommended you make the following change in your medication:   Take Metoprolol Succinate 12.5mg  (if you have heart palpitations, you can go back to 1 whole tablet)  *If you need a refill on your cardiac medications before your next appointment, please call your pharmacy*  Follow-Up: At [facility], you and your health needs are our priority.  As part of our continuing mission to provide you with exceptional heart care, we have created designated Provider Care Teams.  These Care Teams include your primary Cardiologist (physician) and Advanced Practice Providers (APPs -  Physician Assistants and Nurse Practitioners) who all work together to provide you with the care you need, when you need it.  We recommend signing up for the patient portal called "MyChart".  Sign up information is provided on this After Visit Summary.  MyChart is used to connect with patients for Virtual Visits (Telemedicine).  Patients are able to view lab/test results, encounter notes, upcoming appointments, etc.  Non-urgent messages can be sent to your provider as well.   To learn more about what you can do with MyChart, go to [email or web address].    Your next appointment:   6 month(s) ([facility] CLIN[facility])  Provider:   Gillian Shields, NP

## 2023-02-10 ENCOUNTER — Other Ambulatory Visit: Payer: Self-pay | Admitting: Family Medicine

## 2023-02-20 DIAGNOSIS — R490 Dysphonia: Secondary | ICD-10-CM | POA: Diagnosis not present

## 2023-02-20 DIAGNOSIS — J383 Other diseases of vocal cords: Secondary | ICD-10-CM | POA: Diagnosis not present

## 2023-02-20 DIAGNOSIS — R498 Other voice and resonance disorders: Secondary | ICD-10-CM | POA: Diagnosis not present

## 2023-02-20 DIAGNOSIS — J385 Laryngeal spasm: Secondary | ICD-10-CM | POA: Diagnosis not present

## 2023-03-15 DIAGNOSIS — Z6824 Body mass index (BMI) 24.0-24.9, adult: Secondary | ICD-10-CM | POA: Diagnosis not present

## 2023-03-15 DIAGNOSIS — N959 Unspecified menopausal and perimenopausal disorder: Secondary | ICD-10-CM | POA: Diagnosis not present

## 2023-03-15 DIAGNOSIS — Z01419 Encounter for gynecological examination (general) (routine) without abnormal findings: Secondary | ICD-10-CM | POA: Diagnosis not present

## 2023-03-15 DIAGNOSIS — R350 Frequency of micturition: Secondary | ICD-10-CM | POA: Diagnosis not present

## 2023-04-09 ENCOUNTER — Other Ambulatory Visit: Payer: Self-pay | Admitting: Family Medicine

## 2023-04-27 ENCOUNTER — Other Ambulatory Visit: Payer: Self-pay | Admitting: Family Medicine

## 2023-04-27 DIAGNOSIS — J385 Laryngeal spasm: Secondary | ICD-10-CM | POA: Diagnosis not present

## 2023-04-27 NOTE — Telephone Encounter (Signed)
Last Fill: 04/10/23  Last OV: 11/01/22 Next OV: 07/02/23  Routing to provider for review/authorization.

## 2023-04-27 NOTE — Telephone Encounter (Signed)
Copied from CRM [id number]. Topic: Clinical - Medication Refill >> Apr 27, 2023  2:18 PM Eunice Blase wrote: Most Recent Primary Care Visit:  Provider: Everrett Coombe  Department: [facility]  Visit Type: OFFICE VISIT  Date: 11/01/2022  Medication: clonazePAM (KLONOPIN) 0.5 MG tablet  Has the patient contacted their pharmacy? Yes (Agent: If no, request that the patient contact the pharmacy for the refill. If patient does not wish to contact the pharmacy document the reason why and proceed with request.) (Agent: If yes, when and what did the pharmacy advise?)  Is this the correct pharmacy for this prescription? Yes If no, delete pharmacy and type the correct one.  This is the patient's preferred pharmacy:  [facility] [id number] [facility], [place] - [address] [place] [address] Phone: [phone] Fax: [phone]  Has the prescription been filled recently? Yes  Is the patient out of the medication? Yes  Has the patient been seen for an appointment in the last year OR does the patient have an upcoming appointment? Yes  Can we respond through MyChart? Yes  Agent: Please be advised that Rx refills may take up to 3 business days. We ask that you follow-up with your pharmacy.

## 2023-04-30 MED ORDER — CLONAZEPAM 0.5 MG PO TABS: ORAL_TABLET | ORAL | 1 refills | Status: DC

## 2023-05-15 ENCOUNTER — Other Ambulatory Visit: Payer: Self-pay | Admitting: Family Medicine

## 2023-07-05 ENCOUNTER — Encounter

## 2023-08-07 ENCOUNTER — Encounter (HOSPITAL_BASED_OUTPATIENT_CLINIC_OR_DEPARTMENT_OTHER): Payer: Self-pay | Admitting: Family

## 2023-08-07 ENCOUNTER — Ambulatory Visit (INDEPENDENT_AMBULATORY_CARE_PROVIDER_SITE_OTHER): Payer: Medicare Other | Admitting: Family

## 2023-08-07 VITALS — BP 148/80 | HR 74 | Ht 64.0 in | Wt 130.0 lb

## 2023-08-07 DIAGNOSIS — I10 Essential (primary) hypertension: Secondary | ICD-10-CM | POA: Diagnosis not present

## 2023-08-07 DIAGNOSIS — I4719 Other supraventricular tachycardia: Secondary | ICD-10-CM

## 2023-08-07 MED ORDER — VALSARTAN 40 MG PO TABS: 40.0000 mg | ORAL_TABLET | Freq: Every day | ORAL | 2 refills | Status: DC

## 2023-08-07 MED ORDER — METOPROLOL SUCCINATE ER 25 MG PO TB24
12.5000 mg | ORAL_TABLET | Freq: Every day | ORAL | 3 refills | Status: AC
Start: 2023-08-07 — End: ?

## 2023-08-07 NOTE — Progress Notes (Signed)
 Advanced Hypertension Assessment:    Date:  08/07/2023   ID:  Wyn Heater, DOB 26-Sep-1945, MRN [id number]  PCP:  Adela Holter, DO  Cardiologist:  None  Nephrologist:  Referring MD: Adela Holter, DO   CC: Hypertension  History of Present Illness:    Tamara Pope is a 78 y.o. female with a hx of hypertension, anxiety, arthritis, GERD, DVT (04/2020 in the setting of breast implant), spasmodic dysphonia here for follow up of hypertension. Her maternal grandmother had heart attack and CAD.  Established with [facility] 08/30/21. She was diagnosed with hypertension three years prior with BP labile based on stress. PCP had trialed Propranolol  for BP and anxiety benefit but she did not tolerate. Limited salt in diet and stayed active with personal trainer, stretch exercises, walking. Did not tolerate Propranolol  as felt 'jittery' and was started on Valsartan . 14 day ZIO placed due to palpations revealed NSR, 31 episodes of atrial tachycardia up to 11 beats, rare PVC/PAC. BMP, TSH were unremarkable. She was started on Metoprolol  and had visit with the EP who felt her rare PVC/PACs were predominantly related to anxiety. Valsartan  was later reduced to 20mg  daily due to lightheadedness.   Last seen 07/05/22 with chief complaint of anxiety and subsequent fluctuation in blood pressures. She was referred to psychiatry at her request.  24h BP monitor 06/2022 revealed BP controlled on average. She was recommended to move Valsartan  and Metoprolol  to morning instead of at night. Via MyChart message 10/2022 Valsartan  was discontinued due to hypotension and lightheadedness.   Last seen 02/12/2023.  She was interested in being fewer medications, metoprolol  reduced from 25 to 12.5 mg daily.  She presents today for follow up independently. Home BP routinely less than 140.  Was feeling good up until 2 days ago but reports family stressor which is triggered more anxiety.  She  requests a refill of metoprolol  and valsartan .  We discussed that valsartan  had previously been discontinued due to hypotension.  She notes she is still been taking valsartan  40 mg daily in the morning though ran out a couple of days ago and has since had some elevated blood pressure readings.  Notes tingling in bilateral legs at night that is not interrupting sleep.  She is exercising 2 days/week at the gym without claudication, chest pain, dyspnea.  Reassurance provided.   Previous antihypertensives: Propranolol  - felt jittery Valsartan   Past Medical History:  Diagnosis Date   Allergy    Anxiety 1990   Arthritis    Dysphonia, spasmodic    Essential hypertension 02/08/2021   GERD (gastroesophageal reflux disease)    History of DVT (deep vein thrombosis)    JAN 2002-- S/P BREAST IMPLANTS  RIGHT UPPER ARM (AXILLARY/ SUBCLAVIN VEIN)   HLD (hyperlipidemia) 01/03/2018   PMB (postmenopausal bleeding)     Past Surgical History:  Procedure Laterality Date   APPENDECTOMY  AS  CHILD   AUGMENTATION MAMMAPLASTY Bilateral    20 years ago   BILATERAL BREAST LIFT AND IMPLANT REDUCTION  12/2012   BREAST ENHANCEMENT SURGERY Bilateral 02/2000   CATARACT EXTRACTION W/ INTRAOCULAR LENS  IMPLANT, BILATERAL     COSMETIC SURGERY  1995   breast implants   DILATATION & CURRETTAGE/HYSTEROSCOPY WITH RESECTOCOPE N/A 04/18/2013   Procedure: DILATATION & CURETTAGE/HYSTEROSCOPY ;  Surgeon: Piedad Brewer, MD;  Location: [facility];  Service: Gynecology;  Laterality: N/A;   EYE SURGERY  2015   cataracts   JOINT REPLACEMENT  2014   Knee replacement   KNEE ARTHROSCOPY W/ MENISCECTOMY Right 2011   ROTATOR CUFF REPAIR Right 2005   skin surgery     TONSILLECTOMY  AS CHILD   TOTAL KNEE ARTHROPLASTY Right 05/03/2015   Procedure: RIGHT TOTAL KNEE ARTHROPLASTY;  Surgeon: Christie Cox, MD;  Location: [facility] OR;  Service: Orthopedics;  Laterality: Right;    Current Medications: Current Meds   Medication Sig   Biotin 5000 MCG TABS Take 1 tablet by mouth daily.   busPIRone  (BUSPAR ) 10 MG tablet TAKE 1 TABLET BY MOUTH 2 TIMES A DAY   Calcium  Carb-Cholecalciferol 600-800 MG-UNIT TABS Take 1 tablet by mouth 2 (two) times daily.   Cholecalciferol (VITAMIN D3) 2000 units TABS Take 1 tablet by mouth daily.   escitalopram  (LEXAPRO ) 10 MG tablet TAKE 1.5 TABLETS BY MOUTH EVERY NIGHT AT BEDTIME   estradiol (ESTRACE) 0.1 MG/GM vaginal cream Place vaginally as needed.   fluticasone  (FLONASE ) 50 MCG/ACT nasal spray Place 1 spray into both nostrils daily as needed for allergies or rhinitis.   metoprolol  succinate (TOPROL -XL) 25 MG 24 hr tablet Take 12.5 mg by mouth daily.   Probiotic Product (ALIGN) 4 MG CAPS Take 1 capsule by mouth daily.   traZODone  (DESYREL ) 50 MG tablet TAKE ONE TO TWO TABLETS BY MOUTH EVERY NIGHT AT BEDTIME AS NEEDED FOR SLEEP     Allergies:   Bee venom, Wasp venom, and Pollen extract   Social History   Socioeconomic History   Marital status: Married    Spouse name: Jashawn Telesco   Number of children: 2   Years of education: 14   Highest education level: Associate degree: occupational, Scientist, product/process development, or vocational program  Occupational History    Employer: [employer]   Occupation: Retired  Tobacco Use   Smoking status: Never   Smokeless tobacco: Never   Tobacco comments:    None  Vaping Use   Vaping status: Never Used  Substance and Sexual Activity   Alcohol use: Yes    Alcohol/week: 4.0 standard drinks of alcohol    Types: 4 Glasses of wine per week    Comment: 4 glasses of wine a week   Drug use: No   Sexual activity: Not Currently    Partners: Male  Other Topics Concern   Not on file  Social History Narrative   Lives with her husband. She has two daughters. She enjoys dancing and going out to different restaurants.   Social Drivers of Corporate investment banker Strain: Low Risk  (07/01/2023)   Overall Financial Resource Strain (CARDIA)     Difficulty of Paying Living Expenses: Not hard at all  Food Insecurity: No Food Insecurity (07/01/2023)   Hunger Vital Sign    Worried About Running Out of Food in the Last Year: Never true    Ran Out of Food in the Last Year: Never true  Transportation Needs: No Transportation Needs (07/01/2023)   PRAPARE - Administrator, Civil Service (Medical): No    Lack of Transportation (Non-Medical): No  Physical Activity: Insufficiently Active (07/01/2023)   Exercise Vital Sign    Days of Exercise per Week: 2 days    Minutes of Exercise per Session: 60 min  Stress: No Stress Concern Present (07/01/2023)   [employer] of Occupational Health - Occupational Stress Questionnaire    Feeling of Stress : Only a little  Social Connections: Socially Integrated (07/01/2023)   Social Connection and Isolation Panel [NHANES]    Frequency  of Communication with Friends and Family: More than three times a week    Frequency of Social Gatherings with Friends and Family: Twice a week    Attends Religious Services: More than 4 times per year    Active Member of [employer] or Organizations: Yes    Attends Engineer, structural: Not on file    Marital Status: Married     Family History: The patient's family history includes Arthritis in her maternal grandmother and mother; Birth defects in her brother; Cancer in her father; Diabetes in her brother; Heart attack in her paternal grandmother; Heart disease in her maternal grandmother; Hypertension in her mother; Lung cancer in her father. There is no history of Colon cancer or Stomach cancer.  ROS:   Please see the history of present illness.     All other systems reviewed and are negative.  EKGs/Labs/Other Studies Reviewed:         ZIO 08/2021 Quality: Fair.  Baseline artifact. Predominant rhythm:sinus rhythm Average heart rate: 82 bpm Max heart rate: 137 bpm in sinus rhythm.  176 bpm in atrial tachycardia Min heart rate: 53 bpm Pauses  >2.5 seconds: none   Rare PACs and PVCs 31 episodes of atrial tachycardia up to 11 beats.  Fastest rate 176 bpm   Recent Labs: No results found for requested labs within last 365 days.   Recent Lipid Panel    Component Value Date/Time   CHOL 260 (H) 09/19/2021 0000   TRIG 107 09/19/2021 0000   HDL 115 09/19/2021 0000   CHOLHDL 2.3 09/19/2021 0000   VLDL 28.8 02/18/2019 1116   LDLCALC 124 (H) 09/19/2021 0000    Physical Exam:   VS:  BP (!) 157/89 (BP Location: Left Arm, Patient Position: Sitting, Cuff Size: Normal)   Pulse 74   Ht 5\' 4"  (1.626 m)   Wt 130 lb (59 kg)   BMI 22.31 kg/m  , BMI Body mass index is 22.31 kg/m. Vitals:   08/07/23 1532 08/07/23 1555  BP: (!) 157/89 (!) 148/80  Pulse: 74   Height: 5\' 4"  (1.626 m)   Weight: 130 lb (59 kg)   BMI (Calculated): 22.3     GENERAL:  Well appearing HEENT: Pupils equal round and reactive, fundi not visualized, oral mucosa unremarkable NECK:  No jugular venous distention, waveform within normal limits, carotid upstroke brisk and symmetric, no bruits, no thyromegaly LYMPHATICS:  No cervical adenopathy LUNGS:  Clear to auscultation bilaterally HEART:  RRR.  PMI not displaced or sustained,S1 and S2 within normal limits, no S3, no S4, no clicks, no rubs,  murmurs ABD:  Flat, positive bowel sounds normal in frequency in pitch, no bruits, no rebound, no guarding, no midline pulsatile mass, no hepatomegaly, no splenomegaly EXT:  2 plus pulses throughout, no edema, no cyanosis no clubbing SKIN:  No rashes no nodules NEURO:  Cranial nerves II through XII grossly intact, motor grossly intact throughout PSYCH:  Cognitively intact, oriented to person place and time  ASSESSMENT/PLAN:    HTN -initial BP in clinic 157/89 with repeat 148/80.  Reports recent stressor.  BP has been labile often related to anxiety/stress. Did not tolerate Propranolol  as it made her feel "jittery".  Valsartan  previously reduced and subsequently discontinued  however clinic today notes she has continued to take.  As such, will refill valsartan  40 mg daily.  Update BMP today.  Discussed to monitor BP at home at least 2 hours after medications and sitting for 5-10 minutes.  If BP  routinely greater than 130/80 she will contact our office.  Palpitations / PAT - Monitor 08/2021 with 31 episodes of atrial tachycardia (up to 11 beats, fastest rate 176 bpm). Quiescent on Metoprolol  succinate 12.5mg  daily.  Refill provided  Anxiety - Follows with primary care.  Spasmodic dysphonia -Continue to follow with [facility].  Screening for Secondary Hypertension:     08/30/2021    5:06 PM  Causes  Drugs/Herbals Screened  Sleep Apnea Screened     - Comments No snoring.  Thyroid  Disease Screened     - Comments 02/2019 TSH 1.86. Update TSH 08/30/21.  Pheochromocytoma Screened     - Comments Notes palpitations, diaphoresis. Consider urine metanephrines at follow up.  Cushing's Syndrome Screened     - Comments No cushinoid appearance.  Coarctation of the Aorta Screened     - Comments R arm BP 163/84 and L arm 144/81.  Compliance Screened    Relevant Labs/Studies:    Latest Ref Rng & Units 05/26/2022    1:20 PM 09/19/2021   12:00 AM 08/30/2021    3:10 PM  Basic Labs  Sodium 135 - 146 mmol/L 137  132  134   Potassium 3.5 - 5.3 mmol/L 4.6  4.0  5.1   Creatinine 0.60 - 1.00 mg/dL 9.14  7.82  9.56        Latest Ref Rng & Units 05/26/2022    1:20 PM 08/30/2021    3:10 PM  Thyroid    TSH 0.40 - 4.50 mIU/L 1.63  2.310      Disposition:    FU in 6 months   Medication Adjustments/Labs and Tests Ordered: Current medicines are reviewed at length with the patient today.  Concerns regarding medicines are outlined above.  No orders of the defined types were placed in this encounter.  No orders of the defined types were placed in this encounter.  Signed, Clearnce Curia, NP  08/07/2023 3:41 PM    [facility]

## 2023-08-07 NOTE — Patient Instructions (Signed)
 Medication Instructions:  Continue Metoprolol  half tablet once per day  Resume Valsartan  40mg  daily  If blood pressure low and/or you feel lightheaded/dizzy okay to reduce to half tablet once per day   Labwork: Your physician recommends that you return for lab work today: BMET    Follow-Up: Please follow up in 6 months in [facility] with Dr. Theodis Fiscal, Neomi Banks, NP or Donivan Furry PharmD    Special Instructions:   Keep checking blood pressure once per week.

## 2023-08-08 ENCOUNTER — Encounter (HOSPITAL_BASED_OUTPATIENT_CLINIC_OR_DEPARTMENT_OTHER): Payer: Self-pay

## 2023-08-08 LAB — BASIC METABOLIC PANEL WITH GFR
BUN/Creatinine Ratio: 30 — ABNORMAL HIGH (ref 12–28)
BUN: 21 mg/dL (ref 8–27)
CO2: 23 mmol/L (ref 20–29)
Calcium: 9.7 mg/dL (ref 8.7–10.3)
Chloride: 100 mmol/L (ref 96–106)
Creatinine, Ser: 0.7 mg/dL (ref 0.57–1.00)
Glucose: 85 mg/dL (ref 70–99)
Potassium: 4.9 mmol/L (ref 3.5–5.2)
Sodium: 138 mmol/L (ref 134–144)
eGFR: 88 mL/min/{1.73_m2} (ref >59)

## 2023-08-10 DIAGNOSIS — J385 Laryngeal spasm: Secondary | ICD-10-CM | POA: Diagnosis not present

## 2023-08-11 ENCOUNTER — Other Ambulatory Visit: Payer: Self-pay | Admitting: Family Medicine

## 2023-08-14 ENCOUNTER — Encounter: Payer: Self-pay | Admitting: Family Medicine

## 2023-08-14 ENCOUNTER — Ambulatory Visit (INDEPENDENT_AMBULATORY_CARE_PROVIDER_SITE_OTHER): Admitting: Family Medicine

## 2023-08-14 VITALS — BP 120/71 | HR 93 | Ht 64.0 in | Wt 137.8 lb

## 2023-08-14 DIAGNOSIS — I10 Essential (primary) hypertension: Secondary | ICD-10-CM | POA: Diagnosis not present

## 2023-08-14 DIAGNOSIS — E785 Hyperlipidemia, unspecified: Secondary | ICD-10-CM

## 2023-08-14 DIAGNOSIS — R209 Unspecified disturbances of skin sensation: Secondary | ICD-10-CM

## 2023-08-14 DIAGNOSIS — F411 Generalized anxiety disorder: Secondary | ICD-10-CM

## 2023-08-14 DIAGNOSIS — R6889 Other general symptoms and signs: Secondary | ICD-10-CM | POA: Diagnosis not present

## 2023-08-14 MED ORDER — CLONAZEPAM 0.5 MG PO TABS: ORAL_TABLET | ORAL | 1 refills | Status: DC

## 2023-08-14 NOTE — Progress Notes (Signed)
 Tamara Pope - 78 y.o. female MRN [id number]  Date of birth: 19-Nov-1945  Subjective Chief Complaint  Patient presents with   Annual Exam   Anxiety    HPI Tamara Pope is a 78 y.o. female here today for follow up.   She reports that she continues to have anxiety.  Efforts to wean from alprazolam  have bene difficulty but she is doing ok overall at this time.  She remains on lexapro , buspar  and trazodone .   She feels that these are helping some.  She also had clonazepam  previously and this was quite helpful as well.  Requesting a renewal of this.   She does find that she remains forgetful at times.  MRI of the brain in August 2024 did show moderate chronic small vessel ischemic changes within the cerebral white matter.  Previous labs have been relatively unremarkable.  Review of Systems  Constitutional:  Negative for chills, fever, malaise/fatigue and weight loss.  HENT:  Negative for congestion, ear pain and sore throat.   Eyes:  Negative for blurred vision, double vision and pain.  Respiratory:  Negative for cough and shortness of breath.   Cardiovascular:  Negative for chest pain and palpitations.  Gastrointestinal:  Negative for abdominal pain, blood in stool, constipation, heartburn and nausea.  Genitourinary:  Negative for dysuria and urgency.  Musculoskeletal:  Negative for joint pain and myalgias.  Neurological:  Negative for dizziness and headaches.  Endo/Heme/Allergies:  Does not bruise/bleed easily.  Psychiatric/Behavioral:  Negative for depression. The patient is not nervous/anxious and does not have insomnia.     Allergies  Allergen Reactions   Bee Venom Anaphylaxis   Wasp Venom Anaphylaxis   Pollen Extract Cough    Past Medical History:  Diagnosis Date   Allergy    Anxiety 1990   Arthritis    Dysphonia, spasmodic    Essential hypertension 02/08/2021   GERD (gastroesophageal reflux disease)    History of DVT (deep vein thrombosis)    JAN  2002-- S/P BREAST IMPLANTS  RIGHT UPPER ARM (AXILLARY/ SUBCLAVIN VEIN)   HLD (hyperlipidemia) 01/03/2018   PMB (postmenopausal bleeding)     Past Surgical History:  Procedure Laterality Date   APPENDECTOMY  AS  CHILD   AUGMENTATION MAMMAPLASTY Bilateral    20 years ago   BILATERAL BREAST LIFT AND IMPLANT REDUCTION  12/2012   BREAST ENHANCEMENT SURGERY Bilateral 02/2000   CATARACT EXTRACTION W/ INTRAOCULAR LENS  IMPLANT, BILATERAL     COSMETIC SURGERY  1995   breast implants   DILATATION & CURRETTAGE/HYSTEROSCOPY WITH RESECTOCOPE N/A 04/18/2013   Procedure: DILATATION & CURETTAGE/HYSTEROSCOPY ;  Surgeon: Piedad Brewer, MD;  Location: [facility];  Service: Gynecology;  Laterality: N/A;   EYE SURGERY  2015   cataracts   JOINT REPLACEMENT  2014   Knee replacement   KNEE ARTHROSCOPY W/ MENISCECTOMY Right 2011   ROTATOR CUFF REPAIR Right 2005   skin surgery     TONSILLECTOMY  AS CHILD   TOTAL KNEE ARTHROPLASTY Right 05/03/2015   Procedure: RIGHT TOTAL KNEE ARTHROPLASTY;  Surgeon: Christie Cox, MD;  Location: [facility] OR;  Service: Orthopedics;  Laterality: Right;    Social History   Socioeconomic History   Marital status: Married    Spouse name: Jadalyn Oliveri   Number of children: 2   Years of education: 14   Highest education level: Associate degree: occupational, Scientist, product/process development, or vocational program  Occupational History    Employer: [employer]   Occupation: Retired  Tobacco Use   Smoking status: Never   Smokeless tobacco: Never   Tobacco comments:    None  Vaping Use   Vaping status: Never Used  Substance and Sexual Activity   Alcohol use: Yes    Alcohol/week: 4.0 standard drinks of alcohol    Types: 4 Glasses of wine per week    Comment: 4 glasses of wine a week   Drug use: No   Sexual activity: Not Currently    Partners: Male  Other Topics Concern   Not on file  Social History Narrative   Lives with her husband. She has two daughters. She  enjoys dancing and going out to different restaurants.   Social Drivers of Corporate investment banker Strain: Low Risk  (08/16/2023)   Overall Financial Resource Strain (CARDIA)    Difficulty of Paying Living Expenses: Not hard at all  Food Insecurity: No Food Insecurity (08/16/2023)   Hunger Vital Sign    Worried About Running Out of Food in the Last Year: Never true    Ran Out of Food in the Last Year: Never true  Transportation Needs: No Transportation Needs (08/16/2023)   PRAPARE - Administrator, Civil Service (Medical): No    Lack of Transportation (Non-Medical): No  Physical Activity: Sufficiently Active (08/16/2023)   Exercise Vital Sign    Days of Exercise per Week: 4 days    Minutes of Exercise per Session: 60 min  Recent Concern: Physical Activity - Insufficiently Active (08/12/2023)   Exercise Vital Sign    Days of Exercise per Week: 2 days    Minutes of Exercise per Session: 60 min  Stress: No Stress Concern Present (08/16/2023)   [employer] of Occupational Health - Occupational Stress Questionnaire    Feeling of Stress : Only a little  Social Connections: Socially Integrated (08/16/2023)   Social Connection and Isolation Panel [NHANES]    Frequency of Communication with Friends and Family: Three times a week    Frequency of Social Gatherings with Friends and Family: Twice a week    Attends Religious Services: More than 4 times per year    Active Member of Clubs or Organizations: Yes    Attends Engineer, structural: More than 4 times per year    Marital Status: Married    Family History  Problem Relation Age of Onset   Arthritis Mother    Hypertension Mother    Lung cancer Father    Cancer Father    Diabetes Brother        age 94   Birth defects Brother    Arthritis Maternal Grandmother    Heart disease Maternal Grandmother    Heart attack Paternal Grandmother    Colon cancer Neg Hx    Stomach cancer Neg Hx     Health Maintenance   Topic Date Due   DTaP/Tdap/Td (2 - Td or Tdap) 11/04/2019   MAMMOGRAM  07/08/2022   COVID-19 Vaccine (4 - 2024-25 season) 12/03/2022   INFLUENZA VACCINE  11/02/2023   Medicare Annual Wellness (AWV)  08/15/2024   Pneumonia Vaccine 46+ Years old  Completed   DEXA SCAN  Completed   Hepatitis C Screening  Completed   Zoster Vaccines- Shingrix  Completed   HPV VACCINES  Aged Out   Meningococcal B Vaccine  Aged Out   Colonoscopy  Discontinued     ----------------------------------------------------------------------------------------------------------------------------------------------------------------------------------------------------------------- Physical Exam BP 120/71 (BP Location: Left Arm, Patient Position: Sitting, Cuff Size: Small)   Pulse 93  Ht 5\' 4"  (1.626 m)   Wt 137 lb 12.8 oz (62.5 kg)   SpO2 98%   BMI 23.65 kg/m   Physical Exam Constitutional:      General: She is not in acute distress. HENT:     Head: Normocephalic and atraumatic.     Right Ear: Tympanic membrane and ear canal normal.     Left Ear: Tympanic membrane and ear canal normal.     Nose: Nose normal.  Eyes:     General: No scleral icterus.    Conjunctiva/sclera: Conjunctivae normal.  Neck:     Thyroid : No thyromegaly.  Cardiovascular:     Rate and Rhythm: Normal rate and regular rhythm.     Heart sounds: Normal heart sounds.  Pulmonary:     Effort: Pulmonary effort is normal.     Breath sounds: Normal breath sounds.  Abdominal:     General: Bowel sounds are normal. There is no distension.     Palpations: Abdomen is soft.     Tenderness: There is no abdominal tenderness. There is no guarding.  Musculoskeletal:        General: Normal range of motion.     Cervical back: Normal range of motion and neck supple.  Lymphadenopathy:     Cervical: No cervical adenopathy.  Skin:    General: Skin is warm and dry.     Findings: No rash.  Neurological:     General: No focal deficit present.      Mental Status: She is alert and oriented to person, place, and time.     Cranial Nerves: No cranial nerve deficit.     Coordination: Coordination normal.  Psychiatric:        Mood and Affect: Mood normal.        Behavior: Behavior normal.     ------------------------------------------------------------------------------------------------------------------------------------------------------------------------------------------------------------------- Assessment and Plan  Essential hypertension She has seen advanced hypertension clinic and blood pressures pretty well-controlled at this time.  Remains on valsartan  and metoprolol .  Recommend continuation at current strength.  GAD (generalized anxiety disorder) She continues to struggle with anxiety.  She did meet with counselor previously but did not find this particularly helpful.  Continue Lexapro  at current strength.  Continue BuSpar  at current strength.  Clonazepam  renewed.  Encouraged to use this sparingly.  Forgetfulness She had MRI in August 2024 which did show some moderate small vessel disease.  I think her chronic anxiety may be contributing to this some as well.  Referral placed to neuropsych for further evaluation.   Meds ordered this encounter  Medications   clonazePAM  (KLONOPIN ) 0.5 MG tablet    Sig: TAKE 1/2 TABLET BY MOUTH DAILY AS NEEDED FOR ANXIETY    Dispense:  30 tablet    Refill:  1    No follow-ups on file.

## 2023-08-16 ENCOUNTER — Ambulatory Visit

## 2023-08-16 VITALS — Ht 64.0 in | Wt 137.0 lb

## 2023-08-16 DIAGNOSIS — Z0000 Encounter for general adult medical examination without abnormal findings: Secondary | ICD-10-CM | POA: Diagnosis not present

## 2023-08-16 LAB — CBC WITH DIFFERENTIAL/PLATELET
Basophils Absolute: 0 10*3/uL (ref 0.0–0.2)
Basos: 1 %
EOS (ABSOLUTE): 0.1 10*3/uL (ref 0.0–0.4)
Eos: 1 %
Hematocrit: 43.3 % (ref 34.0–46.6)
Hemoglobin: 14.4 g/dL (ref 11.1–15.9)
Immature Grans (Abs): 0 10*3/uL (ref 0.0–0.1)
Immature Granulocytes: 0 %
Lymphocytes Absolute: 2.2 10*3/uL (ref 0.7–3.1)
Lymphs: 26 %
MCH: 31.4 pg (ref 26.6–33.0)
MCHC: 33.3 g/dL (ref 31.5–35.7)
MCV: 94 fL (ref 79–97)
Monocytes Absolute: 0.6 10*3/uL (ref 0.1–0.9)
Monocytes: 7 %
Neutrophils Absolute: 5.5 10*3/uL (ref 1.4–7.0)
Neutrophils: 65 %
Platelets: 308 10*3/uL (ref 150–450)
RBC: 4.59 x10E6/uL (ref 3.77–5.28)
RDW: 11.8 % (ref 11.7–15.4)
WBC: 8.5 10*3/uL (ref 3.4–10.8)

## 2023-08-16 LAB — CMP14+EGFR
ALT: 13 IU/L (ref 0–32)
AST: 18 IU/L (ref 0–40)
Albumin: 4.6 g/dL (ref 3.8–4.8)
Alkaline Phosphatase: 76 IU/L (ref 44–121)
BUN/Creatinine Ratio: 40 — ABNORMAL HIGH (ref 12–28)
BUN: 24 mg/dL (ref 8–27)
Bilirubin Total: 0.4 mg/dL (ref 0.0–1.2)
CO2: 22 mmol/L (ref 20–29)
Calcium: 10.1 mg/dL (ref 8.7–10.3)
Chloride: 102 mmol/L (ref 96–106)
Creatinine, Ser: 0.6 mg/dL (ref 0.57–1.00)
Globulin, Total: 2.2 g/dL (ref 1.5–4.5)
Glucose: 102 mg/dL — ABNORMAL HIGH (ref 70–99)
Potassium: 4.6 mmol/L (ref 3.5–5.2)
Sodium: 139 mmol/L (ref 134–144)
Total Protein: 6.8 g/dL (ref 6.0–8.5)
eGFR: 91 mL/min/{1.73_m2} (ref >59)

## 2023-08-16 LAB — LIPID PANEL WITH LDL/HDL RATIO
Cholesterol, Total: 290 mg/dL — ABNORMAL HIGH (ref 100–199)
HDL: 109 mg/dL (ref >39)
LDL Chol Calc (NIH): 161 mg/dL — ABNORMAL HIGH (ref 0–99)
LDL/HDL Ratio: 1.5 ratio (ref 0.0–3.2)
Triglycerides: 117 mg/dL (ref 0–149)
VLDL Cholesterol Cal: 20 mg/dL (ref 5–40)

## 2023-08-16 LAB — VITAMIN B12: Vitamin B-12: 1744 pg/mL — ABNORMAL HIGH (ref 232–1245)

## 2023-08-16 LAB — TSH: TSH: 2.54 u[IU]/mL (ref 0.450–4.500)

## 2023-08-16 NOTE — Patient Instructions (Signed)
  Ms. Tamara Pope , Thank you for taking time to come for your Medicare Wellness Visit. I appreciate your ongoing commitment to your health goals. Please review the following plan we discussed and let me know if I can assist you in the future.   These are the goals we discussed:  Goals       Maintain healthy active lifestyle.      Patient Stated (pt-stated)      06/24/2021 AWV Goal: Exercise for General Health  Patient will verbalize understanding of the benefits of increased physical activity: Exercising regularly is important. It will improve your overall fitness, flexibility, and endurance. Regular exercise also will improve your overall health. It can help you control your weight, reduce stress, and improve your bone density. Over the next year, patient will increase physical activity as tolerated with a goal of at least 150 minutes of moderate physical activity per week.  You can tell that you are exercising at a moderate intensity if your heart starts beating faster and you start breathing faster but can still hold a conversation. Moderate-intensity exercise ideas include: Walking 1 mile (1.6 km) in about 15 minutes Biking Hiking Golfing Dancing Water  aerobics Patient will verbalize understanding of everyday activities that increase physical activity by providing examples like the following: Yard work, such as: Insurance underwriter Gardening Washing windows or floors Patient will be able to explain general safety guidelines for exercising:  Before you start a new exercise program, talk with your health care provider. Do not exercise so much that you hurt yourself, feel dizzy, or get very short of breath. Wear comfortable clothes and wear shoes with good support. Drink plenty of water  while you exercise to prevent dehydration or heat stroke. Work out until your breathing and your heartbeat get faster.        Patient Stated (pt-stated)      Patient stated that she would like to continue doing the physical training with the trainer and add more walking to her daily routine.      Patient Stated      Patient would like to continue a healthy lifestyle.       Weight (lb) < 148 lb (67.1 kg)      Continue to exercise and eat healthy.         This is a list of the screening recommended for you and due dates:  Health Maintenance  Topic Date Due   DTaP/Tdap/Td vaccine (2 - Td or Tdap) 11/04/2019   Mammogram  07/08/2022   COVID-19 Vaccine (4 - 2024-25 season) 12/03/2022   Flu Shot  11/02/2023   Medicare Annual Wellness Visit  08/15/2024   Pneumonia Vaccine  Completed   DEXA scan (bone density measurement)  Completed   Hepatitis C Screening  Completed   Zoster (Shingles) Vaccine  Completed   HPV Vaccine  Aged Out   Meningitis B Vaccine  Aged Out   Colon Cancer Screening  Discontinued

## 2023-08-16 NOTE — Progress Notes (Signed)
 Subjective:   Tamara Pope is a 78 y.o. female who presents for Medicare Annual (Subsequent) preventive examination.  Visit Complete: Virtual I connected with  Tamara Pope on 08/16/23 by a audio enabled telemedicine application and verified that I am speaking with the correct person using two identifiers.  Patient Location: Home  Provider Location: Office/Clinic  I discussed the limitations of evaluation and management by telemedicine. The patient expressed understanding and agreed to proceed.  Vital Signs: Because this visit was a virtual/telehealth visit, some criteria may be missing or patient reported. Any vitals not documented were not able to be obtained and vitals that have been documented are patient reported.  Patient Medicare AWV questionnaire was completed by the patient on 08/12/2023; I have confirmed that all information answered by patient is correct and no changes since this date.  Cardiac Risk Factors include: advanced age (>51men, >67 women);hypertension     Objective:    Today's Vitals   08/16/23 0857  Weight: 137 lb (62.1 kg)  Height: 5\' 4"  (1.626 m)   Body mass index is 23.52 kg/m.     08/16/2023    9:03 AM 08/09/2022    2:01 PM 06/29/2022    9:05 AM 06/24/2021    2:48 PM 09/21/2020   10:23 AM 02/10/2020    8:22 AM 01/29/2019    1:56 PM  Advanced Directives  Does Patient Have a Medical Advance Directive? Yes Yes Yes Yes Yes Yes Yes  Type of Estate agent of [place];Living will Healthcare Power of [place];Living will Living will Living will Healthcare Power of [place];Living will;Out of facility DNR (pink MOST or yellow form) Healthcare Power of [place];Living will Healthcare Power of [place];Living will  Does patient want to make changes to medical advance directive? No - Patient declined No - Patient declined No - Patient declined No - Patient declined   No - Patient declined  Copy of Healthcare Power of Attorney  in Chart? No - copy requested    No - copy requested Yes - validated most recent copy scanned in chart (See row information) Yes - validated most recent copy scanned in chart (See row information)    Current Medications (verified) Outpatient Encounter Medications as of 08/16/2023  Medication Sig   Biotin 5000 MCG TABS Take 1 tablet by mouth daily.   busPIRone  (BUSPAR ) 10 MG tablet TAKE 1 TABLET BY MOUTH 2 TIMES A DAY   Calcium  Carb-Cholecalciferol 600-800 MG-UNIT TABS Take 1 tablet by mouth 2 (two) times daily.   Cholecalciferol (VITAMIN D3) 2000 units TABS Take 1 tablet by mouth daily.   clonazePAM  (KLONOPIN ) 0.5 MG tablet TAKE 1/2 TABLET BY MOUTH DAILY AS NEEDED FOR ANXIETY   escitalopram  (LEXAPRO ) 10 MG tablet TAKE 1.5 TABLETS BY MOUTH EVERY NIGHT AT BEDTIME   estradiol (ESTRACE) 0.1 MG/GM vaginal cream Place vaginally as needed.   fluticasone  (FLONASE ) 50 MCG/ACT nasal spray Place 1 spray into both nostrils daily as needed for allergies or rhinitis.   metoprolol  succinate (TOPROL -XL) 25 MG 24 hr tablet Take 0.5 tablets (12.5 mg total) by mouth daily.   Probiotic Product (ALIGN) 4 MG CAPS Take 1 capsule by mouth daily.   traZODone  (DESYREL ) 50 MG tablet TAKE 1 TO 2 TABLETS BY MOUTH EVERY NIGHT AT BEDTIME AS NEEDED FOR SLEEP   valsartan  (DIOVAN ) 40 MG tablet Take 1 tablet (40 mg total) by mouth daily.   No facility-administered encounter medications on file as of 08/16/2023.    Allergies (verified) Bee venom,  Wasp venom, and Pollen extract   History: Past Medical History:  Diagnosis Date   Allergy    Anxiety 1990   Arthritis    Dysphonia, spasmodic    Essential hypertension 02/08/2021   GERD (gastroesophageal reflux disease)    History of DVT (deep vein thrombosis)    JAN 2002-- S/P BREAST IMPLANTS  RIGHT UPPER ARM (AXILLARY/ SUBCLAVIN VEIN)   HLD (hyperlipidemia) 01/03/2018   PMB (postmenopausal bleeding)    Past Surgical History:  Procedure Laterality Date   APPENDECTOMY   AS  CHILD   AUGMENTATION MAMMAPLASTY Bilateral    20 years ago   BILATERAL BREAST LIFT AND IMPLANT REDUCTION  12/2012   BREAST ENHANCEMENT SURGERY Bilateral 02/2000   CATARACT EXTRACTION W/ INTRAOCULAR LENS  IMPLANT, BILATERAL     COSMETIC SURGERY  1995   breast implants   DILATATION & CURRETTAGE/HYSTEROSCOPY WITH RESECTOCOPE N/A 04/18/2013   Procedure: DILATATION & CURETTAGE/HYSTEROSCOPY ;  Surgeon: Piedad Brewer, MD;  Location: [facility];  Service: Gynecology;  Laterality: N/A;   EYE SURGERY  2015   cataracts   JOINT REPLACEMENT  2014   Knee replacement   KNEE ARTHROSCOPY W/ MENISCECTOMY Right 2011   ROTATOR CUFF REPAIR Right 2005   skin surgery     TONSILLECTOMY  AS CHILD   TOTAL KNEE ARTHROPLASTY Right 05/03/2015   Procedure: RIGHT TOTAL KNEE ARTHROPLASTY;  Surgeon: Christie Cox, MD;  Location: [facility] OR;  Service: Orthopedics;  Laterality: Right;   Family History  Problem Relation Age of Onset   Arthritis Mother    Hypertension Mother    Lung cancer Father    Cancer Father    Diabetes Brother        age 8   Birth defects Brother    Arthritis Maternal Grandmother    Heart disease Maternal Grandmother    Heart attack Paternal Grandmother    Colon cancer Neg Hx    Stomach cancer Neg Hx    Social History   Socioeconomic History   Marital status: Married    Spouse name: Dezi [place]   Number of children: 2   Years of education: 14   Highest education level: Associate degree: occupational, Scientist, product/process development, or vocational program  Occupational History    Employer: [employer]   Occupation: Retired  Tobacco Use   Smoking status: Never   Smokeless tobacco: Never   Tobacco comments:    None  Vaping Use   Vaping status: Never Used  Substance and Sexual Activity   Alcohol use: Yes    Alcohol/week: 4.0 standard drinks of alcohol    Types: 4 Glasses of wine per week    Comment: 4 glasses of wine a week   Drug use: No   Sexual activity: Not  Currently    Partners: Male  Other Topics Concern   Not on file  Social History Narrative   Lives with her husband. She has two daughters. She enjoys dancing and going out to different restaurants.   Social Drivers of Corporate investment banker Strain: Low Risk  (08/16/2023)   Overall Financial Resource Strain (CARDIA)    Difficulty of Paying Living Expenses: Not hard at all  Food Insecurity: No Food Insecurity (08/16/2023)   Hunger Vital Sign    Worried About Running Out of Food in the Last Year: Never true    Ran Out of Food in the Last Year: Never true  Transportation Needs: No Transportation Needs (08/16/2023)   PRAPARE - Transportation  Lack of Transportation (Medical): No    Lack of Transportation (Non-Medical): No  Physical Activity: Sufficiently Active (08/16/2023)   Exercise Vital Sign    Days of Exercise per Week: 4 days    Minutes of Exercise per Session: 60 min  Recent Concern: Physical Activity - Insufficiently Active (08/12/2023)   Exercise Vital Sign    Days of Exercise per Week: 2 days    Minutes of Exercise per Session: 60 min  Stress: No Stress Concern Present (08/16/2023)   [employer] of Occupational Health - Occupational Stress Questionnaire    Feeling of Stress : Only a little  Social Connections: Socially Integrated (08/16/2023)   Social Connection and Isolation Panel [NHANES]    Frequency of Communication with Friends and Family: Three times a week    Frequency of Social Gatherings with Friends and Family: Twice a week    Attends Religious Services: More than 4 times per year    Active Member of [employer] or Organizations: Yes    Attends Engineer, structural: More than 4 times per year    Marital Status: Married    Tobacco Counseling Counseling given: Not Answered Tobacco comments: None   Clinical Intake:  Pre-visit preparation completed: Yes  Pain : No/denies pain     BMI - recorded: 23.52 Nutritional Status: BMI of 19-24   Normal Nutritional Risks: None Diabetes: No  How often do you need to have someone help you when you read instructions, pamphlets, or other written materials from your doctor or pharmacy?: 1 - Never What is the last grade level you completed in school?: 14  Interpreter Needed?: No      Activities of Daily Living    08/16/2023    8:58 AM 08/12/2023    9:19 AM  In your present state of health, do you have any difficulty performing the following activities:  Hearing? 0 0  Vision? 0 0  Difficulty concentrating or making decisions? 1 1  Walking or climbing stairs? 0 0  Dressing or bathing? 0 0  Doing errands, shopping? 0 0  Preparing Food and eating ? N N  Using the Toilet? N N  In the past six months, have you accidently leaked urine? N N  Do you have problems with loss of bowel control? N N  Managing your Medications? N N  Managing your Finances? N N  Housekeeping or managing your Housekeeping? N N    Patient Care Team: Adela Holter, DO as PCP - General (Family Medicine) Woodrow Hazy, MD as Consulting Physician (Obstetrics and Gynecology) Iline Mallory, MD as Referring Physician (Otolaryngology) Maudine Sos, MD as Attending Physician (Cardiology)  Indicate any recent Medical Services you may have received from other than [facility] providers in the past year (date may be approximate).     Assessment:   This is a routine wellness examination for Tamara Pope.  Hearing/Vision screen No results found.   Goals Addressed             This Visit's Progress    Patient Stated       Patient would like to continue a healthy lifestyle.       Depression Screen    08/16/2023    9:02 AM 11/01/2022   10:11 AM 06/29/2022    9:05 AM 05/26/2022   12:07 PM 05/09/2022   10:59 AM 09/19/2021    2:56 PM 06/24/2021    2:49 PM  PHQ 2/9 Scores  PHQ - 2 Score 0 0 0 0 0 1  0  PHQ- 9 Score    3 1 4      Fall Risk    08/16/2023    9:04 AM 08/12/2023    9:19 AM 07/01/2023    9:01  AM 11/01/2022   10:11 AM 06/29/2022    9:06 AM  Fall Risk   Falls in the past year? 0 0 0 0 0  Number falls in past yr: 0   0 0  Injury with Fall? 0 0 0 0 0  Risk for fall due to : No Fall Risks   No Fall Risks No Fall Risks  Follow up Falls evaluation completed   Falls evaluation completed Falls evaluation completed    MEDICARE RISK AT HOME: Medicare Risk at Home Any stairs in or around the home?: Yes If so, are there any without handrails?: No Home free of loose throw rugs in walkways, pet beds, electrical cords, etc?: Yes Adequate lighting in your home to reduce risk of falls?: Yes Life alert?: No Use of a cane, walker or w/c?: No Grab bars in the bathroom?: No Shower chair or bench in shower?: No Elevated toilet seat or a handicapped toilet?: No  TIMED UP AND GO:  Was the test performed?  No    Cognitive Function:        08/16/2023    9:04 AM 06/29/2022    9:10 AM 06/24/2021    2:54 PM  6CIT Screen  What Year? 0 points 0 points 0 points  What month? 0 points 0 points 0 points  What time? 0 points 0 points 0 points  Count back from 20 0 points 0 points 0 points  Months in reverse 0 points 0 points 0 points  Repeat phrase 2 points 4 points 4 points  Total Score 2 points 4 points 4 points    Immunizations Immunization History  Administered Date(s) Administered   Influenza, High Dose Seasonal PF 01/14/2015, 01/03/2018, 01/07/2019, 01/06/2023   Influenza-Unspecified 02/19/2020, 01/20/2021, 02/01/2022   Moderna SARS-COV2 Booster Vaccination 08/03/2020   PFIZER Comirnaty(Gray Top)Covid-19 Tri-Sucrose Vaccine 05/05/2019, 05/27/2019   PFIZER(Purple Top)SARS-COV-2 Vaccination 02/08/2020   Pneumococcal Conjugate-13 10/11/2017   Pneumococcal Polysaccharide-23 02/25/2019   Respiratory Syncytial Virus Vaccine,Recomb Aduvanted(Arexvy) 02/01/2022   Tdap 11/03/2009   Zoster Recombinant(Shingrix) 07/30/2019, 11/17/2019   Zoster, Live 12/05/2019    TDAP status: Due, Education  has been provided regarding the importance of this vaccine. Advised may receive this vaccine at local pharmacy or Health Dept. Aware to provide a copy of the vaccination record if obtained from local pharmacy or Health Dept. Verbalized acceptance and understanding.  Flu Vaccine status: Up to date  Pneumococcal vaccine status: Up to date  Covid-19 vaccine status: Declined, Education has been provided regarding the importance of this vaccine but patient still declined. Advised may receive this vaccine at local pharmacy or Health Dept.or vaccine clinic. Aware to provide a copy of the vaccination record if obtained from local pharmacy or Health Dept. Verbalized acceptance and understanding.  Qualifies for Shingles Vaccine? Yes   Zostavax completed Yes   Shingrix Completed?: Yes  Screening Tests Health Maintenance  Topic Date Due   DTaP/Tdap/Td (2 - Td or Tdap) 11/04/2019   MAMMOGRAM  07/08/2022   COVID-19 Vaccine (4 - 2024-25 season) 12/03/2022   INFLUENZA VACCINE  11/02/2023   Medicare Annual Wellness (AWV)  08/15/2024   Pneumonia Vaccine 41+ Years old  Completed   DEXA SCAN  Completed   Hepatitis C Screening  Completed   Zoster Vaccines- Shingrix  Completed   HPV VACCINES  Aged Out   Meningococcal B Vaccine  Aged Out   Colonoscopy  Discontinued    Health Maintenance  Health Maintenance Due  Topic Date Due   DTaP/Tdap/Td (2 - Td or Tdap) 11/04/2019   MAMMOGRAM  07/08/2022   COVID-19 Vaccine (4 - 2024-25 season) 12/03/2022    Colorectal cancer screening: No longer required.   Mammogram status: Completed 07/07/2020. Repeat every year She will talk to her GYN.  Bone Density done in 2020. She will talk to her GYN.  Lung Cancer Screening: (Low Dose CT Chest recommended if Age 11-80 years, 20 pack-year currently smoking OR have quit w/in 15years.) does not qualify.   Lung Cancer Screening Referral: n/a  Additional Screening:  Hepatitis C Screening: does qualify; Completed  10/11/2017  Vision Screening: Recommended annual ophthalmology exams for early detection of glaucoma and other disorders of the eye. Is the patient up to date with their annual eye exam?  Yes  Who is the provider or what is the name of the office in which the patient attends annual eye exams?  If pt is not established with a provider, would they like to be referred to a provider to establish care? N/a.   Dental Screening: Recommended annual dental exams for proper oral hygiene   Community Resource Referral / Chronic Care Management: CRR required this visit?  No   CCM required this visit?  No     Plan:     I have personally reviewed and noted the following in the patient's chart:   Medical and social history Use of alcohol, tobacco or illicit drugs  Current medications and supplements including opioid prescriptions. Patient is not currently taking opioid prescriptions. Functional ability and status Nutritional status Physical activity Advanced directives List of other physicians Hospitalizations, surgeries, and ER visits in previous 12 months, None Vitals Screenings to include cognitive, depression, and falls Referrals and appointments  In addition, I have reviewed and discussed with patient certain preventive protocols, quality metrics, and best practice recommendations. A written personalized care plan for preventive services as well as general preventive health recommendations were provided to patient.     Aubrey Leaf, CMA   08/16/2023   After Visit Summary: (MyChart) Due to this being a telephonic visit, the after visit summary with patients personalized plan was offered to patient via MyChart   Nurse Notes:    Eleesha Crichton is a 78 y.o. female patient of Adela Holter, DO who had a Medicare Annual Wellness Visit today via telephone. Chavonne is Retired and lives with their spouse. She has 2 children. she reports that she is socially active and does  interact with friends/family regularly. She is moderately physically active and enjoys dancing and trying different restaurants.

## 2023-08-19 DIAGNOSIS — R6889 Other general symptoms and signs: Secondary | ICD-10-CM | POA: Insufficient documentation

## 2023-08-19 NOTE — Assessment & Plan Note (Signed)
 She continues to struggle with anxiety.  She did meet with counselor previously but did not find this particularly helpful.  Continue Lexapro  at current strength.  Continue BuSpar  at current strength.  Clonazepam  renewed.  Encouraged to use this sparingly.

## 2023-08-19 NOTE — Assessment & Plan Note (Signed)
 She had MRI in August 2024 which did show some moderate small vessel disease.  I think her chronic anxiety may be contributing to this some as well.  Referral placed to neuropsych for further evaluation.

## 2023-08-19 NOTE — Assessment & Plan Note (Signed)
 She has seen advanced hypertension clinic and blood pressures pretty well-controlled at this time.  Remains on valsartan  and metoprolol .  Recommend continuation at current strength.

## 2023-08-21 ENCOUNTER — Ambulatory Visit

## 2023-08-21 ENCOUNTER — Ambulatory Visit: Payer: Self-pay | Admitting: Family Medicine

## 2023-09-03 DIAGNOSIS — H04123 Dry eye syndrome of bilateral lacrimal glands: Secondary | ICD-10-CM | POA: Diagnosis not present

## 2023-09-03 DIAGNOSIS — H0011 Chalazion right upper eyelid: Secondary | ICD-10-CM | POA: Diagnosis not present

## 2023-09-03 DIAGNOSIS — H02882 Meibomian gland dysfunction right lower eyelid: Secondary | ICD-10-CM | POA: Diagnosis not present

## 2023-09-05 DIAGNOSIS — L570 Actinic keratosis: Secondary | ICD-10-CM | POA: Diagnosis not present

## 2023-09-05 DIAGNOSIS — L820 Inflamed seborrheic keratosis: Secondary | ICD-10-CM | POA: Diagnosis not present

## 2023-09-05 DIAGNOSIS — L720 Epidermal cyst: Secondary | ICD-10-CM | POA: Diagnosis not present

## 2023-09-05 DIAGNOSIS — L821 Other seborrheic keratosis: Secondary | ICD-10-CM | POA: Diagnosis not present

## 2023-09-05 DIAGNOSIS — D224 Melanocytic nevi of scalp and neck: Secondary | ICD-10-CM | POA: Diagnosis not present

## 2023-09-05 DIAGNOSIS — L814 Other melanin hyperpigmentation: Secondary | ICD-10-CM | POA: Diagnosis not present

## 2023-09-05 DIAGNOSIS — D225 Melanocytic nevi of trunk: Secondary | ICD-10-CM | POA: Diagnosis not present

## 2023-09-05 DIAGNOSIS — D2262 Melanocytic nevi of left upper limb, including shoulder: Secondary | ICD-10-CM | POA: Diagnosis not present

## 2023-09-05 DIAGNOSIS — D1801 Hemangioma of skin and subcutaneous tissue: Secondary | ICD-10-CM | POA: Diagnosis not present

## 2023-09-05 DIAGNOSIS — Z85828 Personal history of other malignant neoplasm of skin: Secondary | ICD-10-CM | POA: Diagnosis not present

## 2023-09-08 DIAGNOSIS — H0011 Chalazion right upper eyelid: Secondary | ICD-10-CM | POA: Diagnosis not present

## 2023-09-12 ENCOUNTER — Encounter: Payer: Self-pay | Admitting: Psychology

## 2023-09-15 ENCOUNTER — Other Ambulatory Visit: Payer: Self-pay | Admitting: Family Medicine

## 2023-10-18 ENCOUNTER — Encounter: Attending: Psychology | Admitting: Psychology

## 2023-10-18 ENCOUNTER — Encounter: Payer: Self-pay | Admitting: Psychology

## 2023-10-18 DIAGNOSIS — R413 Other amnesia: Secondary | ICD-10-CM | POA: Diagnosis not present

## 2023-10-18 DIAGNOSIS — F419 Anxiety disorder, unspecified: Secondary | ICD-10-CM | POA: Insufficient documentation

## 2023-10-18 NOTE — Progress Notes (Addendum)
 NEUROPSYCHOLOGICAL EVALUATION [place]. [facility]  Physical Medicine and Rehabilitation     Patient: Tamara Pope  MRN: [id number] DOB: 1945-12-21  Age: 78 y.o. Sex: female  Race/Ethnicity: White or Caucasian  Years of Education: 14 Handedness: Right  Collateral Information Source: Spouse [name]  Referring Provider: Alvia Bring, DO  Provider/Clinical Neuropsychologist: Evalene DOROTHA Riff, PsyD  Date of Service: 10/18/2023 Start Time: 10 AM End Time: 12 PM  Location of Service:  [facility] Physical Medicine & Rehabilitation Department [place]. [facility] [address], [place]. [address] [place], [place] [address] Phone: [phone]  Billing Code/Service:            96116/96121  Individuals Present: Patient was seen accompanied by her husband with her permission, in-person, by the provider. 1 hour and 15 minutes spent in face-to-face clinical interview and remaining 45 minutes was spent in record review, documentation, and testing protocol construction.    PATIENT CONSENT AND CONFIDENTIALITY The patient's understanding of the reason for referral was intact. Discussed limits of confidentiality including, but not limited to, posting of final evaluation report in the patient's electronic medical record for both the patient and for the referring provider and appropriate medical professionals. Patient was given the opportunity to have their questions answered. The neuropsychological evaluation process was discussed with the patient and they consented to proceed with the evaluation.  Consent for Evaluation and Treatment: Signed: Yes Explanation of Privacy Policies: Signed: Yes Discussion of Confidentiality Limits: Yes  REASON FOR REFERRAL:The patient was referred for neuropsychological evaluation by her primary care provider, Dr. Alvia, pm 08/19/2023 due to concerns for cognitive changes involving increased forgetfulness  Referring provider records indicated  in August 2024 MRI showed moderate small vessel disease. Chronic anxiety was also posed as a possible contributor to her cognitive difficulties as well. that chronic anxiety. The patient's medical history includes spasmodic Dysphonia, arthritis, essential hypertension, and hyperlipidemia.    Upon interview, the patient indicated she was interested in pursuing the evaluation to help determine the cause of her cognitive difficulties. Specifically, she wonders if difficulties are due to anxiety or if they reflect other etiology.   HISTORY OF PRESENTING CONCERNS:  Symptom Onset & Course: The patient indicated she first noticed cognitive changes approximately 6 months ago.  She described a gradual onset and is unsure whether course is progressively worsening. The patient's spouse indicated that he felt that cognitive symptoms began around 3 years ago with gradual onset and progressive course.  He noted that, per his perspective, anxiety also worsened around this time.  He felt that both cognitive and anxiety difficulties coincided with difficulties linked to spasmodic dysphonia.  The patient describes significant psychosocial struggles related to the condition as there are extended periods (sometimes weeks), post-botox treatment, in which her speech volume is limited dramatically. She is, per her and spouse report, a very social person and verbal communication difficulties are exceptionally frustrating and limiting to her quality of life.    The patient acknowledges some trait-like characteristics related to anxiety which have been long running; tendency towards perfectionism, emphasis on keeping the environment very neat and organized, and described herself as having some OCD tendencies. She described concerns about making errors driving her asking questions repeatedly and rechecking appointments etc.   Current Cognitive Complaints:  Memory: Minimal short-term memory difficulties overall. One instance of  repeating a bill payment (2x) but no other problems in managing finances were noted.  Based on follow-up questioning, problems with asking the same question  repeatedly to her spouse appear secondary to anxiety versus rapid information loss.  No significant difficulties noted with remembering conversations, recent events, getting lost in familiar places, or remembering medications.  Some slight difficulties with misplacing things that appear to be secondary to attention. Processing Speed: No changes. Attention & Concentration: Patient describes some mild difficulties with attention and concentration such that she recognizes that if she places something somewhere and does not attend adequately to where she placed it (in that moment) she will struggle to find it later.  She denies any significant difficulties with sustained attention, distraction, or frequently losing train of thought. Language: No significant difficulties with expressive language aside from interference from dysphonia.  No significant indications of receptive language difficulties or changes.  No problems with reading (reads frequently for enjoyment), writing, or basic mental arithmetic. Visual-Spatial: No clear indications of changes in visual spatial abilities. Executive Functioning: No significant indications of problems with problem solving, decision making, planning, organization, or indications of behavioral/personality changes.   Motor/Sensory Complaints:   Sensory changes: No significant changes in sense of smell or taste.  No hearing difficulties.  No unaccounted for changes in vision.  Balance/coordination difficulties: None. Frequent instances of dizziness/vertigo: Infrequent lightheadedness. Other motor difficulties: No difficulties with tremor.  Emotional and Behavioral Functioning:   Depression: She denied any indications or frequent feelings of sadness, hopelessness, anhedonia, suicidal ideation, or other indications of  depression.  She denied any significant depression history. Anxiety: First onset of difficulties with anxiety began around the age of 1, coinciding with the loss of her father.  She describes concern/worry about making mistakes, feeling to accomplish things, and describes some indications of self-doubt.  Anxiety appears to be elevated over the last 3 years, per husbands report, even outside more acute exacerbation linked to losing her voice.  She has a remote history of panic attacks but none in recent years. Other: She describes some minimal checking behavior at present but time spent on this behavior is minimal and that clearly causing clinically significant distress or impairment in functioning.  The patient denied any current or past suicidal ideation, homicidal ideation, hallucinations, or paranoia. Treatment: The patient has never been involved in individual therapy outside of couples therapy.  She has reportedly been trialed on multiple medications for anxiety and there was some concern for cognitive side effects of these medications and motion towards reducing doses as a result.   Sleep:  Estimated hours obtained each night: 8-10 Difficulties falling asleep: None Difficulties staying asleep: None Feels rested and refreshed upon awakening: Yes History of obstructive sleep apnea: No Instances of acting out her dreams: None Appetite: Good Caffeine: None Alcohol Use: One to two drinks approximately 4x per week when going out to eat.  Tobacco Use: None Recreational Substance Use: None  Level of Functional Independence: The patient is intact with basic activities of daily living.  Finances: As noted, one instance when a bill was paid to extra times.  Functioning is otherwise intact in this area.  Shopping / Meal Preparation: Intact Household Maintenance / Chores: Therapist, nutritional / Future Obligations: No difficulties with tracking and making it to medical appointments  independently. Medication Management: Intact Driving: Intact  Medical History/Record Review: Per records and patient report; History of traumatic brain injury/concussion: None History of stroke: None History of heart attack: None History of cancer/chemotherapy: None History of seizure activity: None Symptoms of chronic pain: None Experience of frequent headaches/migraines: None  Past Medical History:  Diagnosis Date  Allergy    Anxiety 1990   Arthritis    Dysphonia, spasmodic    Essential hypertension 02/08/2021   GERD (gastroesophageal reflux disease)    History of DVT (deep vein thrombosis)    JAN 2002-- S/P BREAST IMPLANTS  RIGHT UPPER ARM (AXILLARY/ SUBCLAVIN VEIN)   HLD (hyperlipidemia) 01/03/2018   PMB (postmenopausal bleeding)     Patient Active Problem List   Diagnosis Date Noted   Forgetfulness 08/19/2023   Disturbance of skin sensation 11/01/2022   Tinnitus 11/01/2022   Palpitations 09/01/2021   Essential hypertension 02/08/2021   Osteopenia 09/21/2020   Wrist fracture 03/21/2019   History of 2019 novel coronavirus disease (COVID-19) 02/15/2019   Spastic dysphonia 12/10/2018   HLD (hyperlipidemia) 01/03/2018   Spasmodic dysphonia 10/11/2017   Allergic rhinitis 10/11/2017   GAD (generalized anxiety disorder) 10/11/2017   Laryngospasms 02/27/2017   Arthritis of midfoot 05/04/2016   S/P total knee arthroplasty 05/03/2015   Minor opacity of both corneas 08/16/2014   Osteoarthritis of knee 12/21/2011   Tear of medial meniscus of knee, current 12/21/2011   Status post cataract extraction and insertion of intraocular lens 11/14/2011   Status post LASIK surgery 10/11/2011    Imaging/Lab Results:  11/03/22 EXAM: MRI HEAD WITHOUT AND WITH CONTRAST FINDINGS: Brain:  IMPRESSION: 1. No evidence of an acute intracranial abnormality. 2. No cerebellopontine angle or internal auditory canal mass. 3. No specific etiology of hearing loss or tinnitus is  identified. 4. Moderate chronic small vessel ischemic changes within the cerebral white matter.  Family Neurologic/Medical Hx: None Family History  Problem Relation Age of Onset   Arthritis Mother    Hypertension Mother    Lung cancer Father    Cancer Father    Diabetes Brother        age 89   Birth defects Brother    Arthritis Maternal Grandmother    Heart disease Maternal Grandmother    Heart attack Paternal Grandmother    Colon cancer Neg Hx    Stomach cancer Neg Hx    Medications:  Biotin 5000 MCG TABS busPIRone  (BUSPAR ) 10 MG tablet  Calcium  Carb-Cholecalciferol 600-800 MG-UNIT TABS Cholecalciferol (VITAMIN D3) 2000 units TABS clonazePAM  (KLONOPIN ) 0.5 MG tablet escitalopram  (LEXAPRO ) 10 MG tablet -  estradiol (ESTRACE) 0.1 MG/GM vaginal cream fluticasone  (FLONASE ) 50 MCG/ACT nasal spray metoprolol  succinate (TOPROL -XL) 25 MG 24 hr tablet Probiotic Product (ALIGN) 4 MG CAPS traZODone  (DESYREL ) 50 MG tablet  valsartan  (DIOVAN ) 40 MG tablet -  Academic/Vocational History: Highest level of educational attainment: Associates degree for medical assistant. Performed slightly above average in school.  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 Stepped down from those responsibilities a number of years ago.    Psychosocial: Marital Status: Married to her husband for 58 years. Children/Grandchildren: Two adult daughters and two grandchildren. Living Situation: Lives alone with spouse in their home. Daily Activities/Hobbies: Traveling, but [place], woman's club, going out, going to [employer], going to church.  Mental Status/Behavioral Observations: The patient was seen on an outpatient basis in the [facility] PM&R office for the clinical interview accompanied by her spouse with her permission. Sensorium/Arousal: No clear sensory difficulties.   Alert. Orientation: Full. Appearance: Appropriate dress and hygiene. Behavior: Within normal limits. Speech/Language: Expressive speech was prosodic, fluent, and well-articulated.  No difficulties with tone or volume.  Receptive language appeared grossly intact based on behavioral observations. Motor:  Ambulated independently without issue.  No tremor. Social Comportment: Appropriate for the setting. Mood: Neutral to anxious. Appeared more relaxed by end of interview.   Affect: Congruent. Thought Process/Content: Coherent, linear, and goal directed. Ability to Participate in Interview: Intact Insight: Within normal limits.   SUMMARY / CLINICAL IMPRESSIONS The patient was referred for neuropsychological evaluation by her primary care provider, Dr. Alvia, pm 08/19/2023 due to concerns for cognitive changes involving increased forgetfulness  Referring provider records indicated in August 2024 MRI showed moderate small vessel disease. Chronic anxiety was also posed as a possible contributor to her cognitive difficulties as well. that chronic anxiety. The patient's medical history includes spasmodic Dysphonia, arthritis, essential hypertension, and hyperlipidemia.  Upon interview, the patient indicated she was interested in pursuing the evaluation to help determine the cause of her cognitive difficulties. Specifically, she wonders if difficulties are due to anxiety or if they reflect other etiology.   The patient indicated she first noticed cognitive changes approximately 6 months ago.  She described a gradual onset and is unsure whether course is progressively worsening. The patient's spouse indicated that he felt that cognitive symptoms began around 3 years ago with gradual onset and progressive course.  He noted that, per his perspective, anxiety also worsened around this time.  He felt that both cognitive and anxiety difficulties coincided with difficulties linked to spasmodic dysphonia.  The  patient describes significant psychosocial struggles related to the condition as there are extended periods (sometimes weeks), post-botox treatment, in which her speech volume is limited dramatically. She is, per her and spouse report, a very social person and verbal communication difficulties are exceptionally frustrating and limiting to her quality of life.    The patient describes relatively minimal cognitive difficulties subjectively.  There are also some indications of anxiety related interference.  Functioning appears intact overall.  Given the patient's age and imaging findings would remain appropriate to proceed with cognitive testing to assess for any possible unidentified etiologies contributing to her difficulties.  DISPOSITION / PLAN  The patient has been set up for a formal neuropsychological assessment to objectively assess her cognitive functioning across domains to establish the patient's cognitive profile. This data, in conjunction with information obtained via clinical interview and medical record review, will help clarify likely etiology and guide treatment recommendations. Once data collection and interpretation have been completed, the findings / diagnosis and recommendations will be reviewed and discussed with the patient during a feedback appointment with the neuropsychologist. Based on the collaborative dialogue with the patient during the feedback, recommendations may be adjusted / tailored as needed. A formal report will be produced and provided to the patient and the referring provider.   Diagnosis:  Cognitive Changes   This report was generated using voice recognition software. While this document has been carefully reviewed, transcription errors may be present. I apologize in advance for any inconvenience. Please contact me if further clarification is needed.             Tamara DOROTHA Riff, PsyD             Neuropsychologist

## 2023-10-21 ENCOUNTER — Other Ambulatory Visit: Payer: Self-pay | Admitting: Family Medicine

## 2023-11-06 ENCOUNTER — Encounter: Attending: Psychology

## 2023-11-06 DIAGNOSIS — F419 Anxiety disorder, unspecified: Secondary | ICD-10-CM | POA: Diagnosis not present

## 2023-11-06 DIAGNOSIS — R413 Other amnesia: Secondary | ICD-10-CM | POA: Diagnosis not present

## 2023-11-06 NOTE — Progress Notes (Signed)
 Behavioral Observations:  The patient was mostly oriented to self, place, and time (could not provide exact name of medical organization). Her hearing and vision were adequate for testing. She ambulated independently and without issue. No hand tremor was noted during testing. Her speech was prosodic, fluent, and well-articulated. She displayed no clear indications of word-finding difficulties in conversational speech and no notable paraphasic errors were noted. Receptive language appeared intact. The patient's affect was congruent with mood and her mood was largely neutral to anxious. The patient was alert and participated in testing as instructed. She was cooperative throughout the session. Her pace was steady. She showed moderate difficulties with frustration tolerance throughout the testing session and reported significant feelings of anxiety related to the assessment. Other social interactions were unremarkable and consistent with the setting. No frank attentional lapses were appreciated.  Neuropsychology Note  Makayli Bracken Molner completed 105 minutes of neuropsychological testing with technician, Josue Ned, BA, under the supervision of Evalene Riff, PsyD., Clinical Neuropsychologist. The patient did not appear overtly distressed by the testing session, per behavioral observation or via self-report to the technician. Rest breaks were offered.   Clinical Decision Making: In considering the patient's current level of functioning, level of presumed impairment, nature of symptoms, emotional and behavioral responses during clinical interview, level of literacy, and observed level of motivation/effort, a battery of tests was selected by Dr. Riff during initial consultation on 10/18/2023. This was communicated to the technician. Communication between the neuropsychologist and technician was ongoing throughout the testing session and changes were made as deemed necessary based on patient performance  on testing, technician observations and additional pertinent factors such as those listed above.  Tests Administered: Brief Visuospatial Memory Test-Revised Building surveyor) Clock Drawing Environmental consultant Function System (D-KEFS), select subtests Hopkins Verbal Learning Test - Revised (HVLT-R) Repeatable Battery for the Assessment of Neuropsychological Status Update (RBANS) Trail Making Test (TMT; Part A & B) Wechsler Adult Intelligence Scale-Fourth Edition (WAIS-IV), select subtests Wechsler Memory Scale-Fourth Edition (WMS-IV) , select subtests Wechsler Memory Scale-Third Edition (WMS-III), select subtests  Wechsler Test of Adult Reading (WTAR) Geriatric Depression Scale-Short Form (GDS-SF) Geriatric Anxiety Inventory (GAI) COWAT (FAS, Animals) [employer] - 2nd Edition (BNT-2)  Results: Note: This summary of test scores accompanies the interpretive report and should not be interpreted by unqualified individuals or in isolation without reference to the report. Test scores are relative to age, gender, and educational history as available and appropriate.   Measurement properties of test scores: IQ, Index, and Standard Scores (SS): Mean = 100; Standard Deviation = 15; Scaled Scores (ss): Mean = 10; Standard Deviation = 3; Z scores (Z): Mean = 0; Standard Deviation = 1; T scores (T); Mean = 50; Standard Deviation = 10  Intellectual/Premorbid Functioning Estimate   Norm Score Percentile  Range  Wechsler Test of Adult Reading  SS = 113 81 %ile High Average   ATTENTION AND WORKING MEMORY    Norm Score Percentile  Range  WAIS-IV          Digit Span  ss = 9 37 %ile Average   DSF  ss = 8 25 %ile Average   Span:    5      DSB  ss = 10 50 %ile Average   Span:    5      DSS  ss = 9 37 %ile Average   Span:    4     WMS-III  Spatial Span  ss = 9 37 %ile Average   SSF  ss = 10 50 %ile Average   Span:    6      SSB  ss = 9 37 %ile Average   Span:    4       PROCESSING  SPEED    Norm Score Percentile  Range  RBANS          Coding  ss = 6 9 %ile Low Average   LANGUAGE    Norm Score Percentile  Range  Boston Naming Test (BNT-2)  t = 52 58 %ile Average  COWAT          FAS  t = 49 45 %ile Average   Animals  t = 49 45 %ile Average   EXECUTIVE FUNCTIONING    Norm Score Percentile  Range  DKEFS - Color-Word Interference          Color Naming  ss = 9 37 %ile Average   Word Reading  ss = 8 25 %ile Average   Inhibition  ss = 9 37 %ile Average   Errors  ss = 11 63 %ile Average   Inhibition Switching  ss = 8 25 %ile Average   Errors  ss = 3 1 %ile Exceptionally Low  Trails A  t = 45 30 %ile Average  Trails B  t = 45 30 %ile Average   MEMORY    Norm Score Percentile  Range  BVMT-R          Trial 1  t = 32.0 4 %ile Below Average   Trial 2  t = 22 0.2 %ile Exceptionally Low   Trial 3  t = <20 <0.1 %ile Exceptionally Low   Total Recall  t = 20.0 0.1 %ile Exceptionally Low   Learning  t = 30.0 2 %ile Below Average   Delayed Recall  t = <20 <0.1 %ile Exceptionally Low   % Retained    0 <1 %ile Exceptionally Low   Hits     >16 %ile WNL   False Alarms     11-16 %ile Low Average   Recognition Discriminability     11-16 %ile Low Average  HVLT          Total Recall  t = 29.0 2 %ile Exceptionally Low   Delayed Recall  t = <20 <0.1 %ile Exceptionally Low   %Retention  t = <20 <0.1 %ile Exceptionally Low   Recognition Discriminability  t = 32.0 4 %ile Below Average  Wechsler Memory Scale, 4th Edition (WMS-4)         Log. Mem. Immediate Recall  ss = 7 16 %ile Low Average   Logical Memory Delayed Recall  ss = 5 5 %ile Below Average   Logical Recognition    10th-16th  %ile Low Average   VISUAL-SPATIAL    Norm Score Percentile  Range  Clock                   RBANS Visuospatial Index          RBANS Figure Copy  ss = 7 16 %ile Low Average   RBANS Line Orientation      >75th %ile   WNL   PERSONALITY AND BEHAVIORAL FUNCTIONING      Score/Interpretation  GDS-SF Raw        2  GDS-SF Severity       Minimal.  GAI Raw  5  GAI Severity       Minimal.    Feedback to Patient: Keyonta Madrid will return on 11/13/2023 for an interactive feedback session with Dr. Hayden at which time her test performances, clinical impressions and treatment recommendations will be reviewed in detail. The patient understands she can contact our office should she require our assistance before this time.  105 minutes spent face-to-face with patient administering standardized tests, 30 minutes spent scoring Radiographer, therapeutic). [CPT [id number], [id number]]  Full report to follow.

## 2023-11-08 ENCOUNTER — Encounter: Admitting: Psychology

## 2023-11-08 DIAGNOSIS — G3184 Mild cognitive impairment, so stated: Secondary | ICD-10-CM | POA: Diagnosis not present

## 2023-11-08 DIAGNOSIS — F419 Anxiety disorder, unspecified: Secondary | ICD-10-CM | POA: Diagnosis not present

## 2023-11-08 DIAGNOSIS — R413 Other amnesia: Secondary | ICD-10-CM | POA: Diagnosis not present

## 2023-11-09 ENCOUNTER — Other Ambulatory Visit: Payer: Self-pay | Admitting: Family Medicine

## 2023-11-13 ENCOUNTER — Encounter: Admitting: Psychology

## 2023-11-13 DIAGNOSIS — G3184 Mild cognitive impairment, so stated: Secondary | ICD-10-CM | POA: Diagnosis not present

## 2023-11-13 DIAGNOSIS — F419 Anxiety disorder, unspecified: Secondary | ICD-10-CM

## 2023-11-24 ENCOUNTER — Encounter: Payer: Self-pay | Admitting: Family Medicine

## 2023-11-24 DIAGNOSIS — B029 Zoster without complications: Secondary | ICD-10-CM | POA: Diagnosis not present

## 2023-11-24 MED ORDER — PREDNISONE 10 MG (21) PO TBPK: ORAL_TABLET | ORAL | 0 refills | Status: DC

## 2023-11-24 MED ORDER — VALACYCLOVIR HCL 1 G PO TABS: 1000.0000 mg | ORAL_TABLET | Freq: Three times a day (TID) | ORAL | 0 refills | Status: AC

## 2023-12-06 DIAGNOSIS — Z1231 Encounter for screening mammogram for malignant neoplasm of breast: Secondary | ICD-10-CM | POA: Diagnosis not present

## 2023-12-06 DIAGNOSIS — M8588 Other specified disorders of bone density and structure, other site: Secondary | ICD-10-CM | POA: Diagnosis not present

## 2023-12-06 DIAGNOSIS — N958 Other specified menopausal and perimenopausal disorders: Secondary | ICD-10-CM | POA: Diagnosis not present

## 2023-12-13 ENCOUNTER — Other Ambulatory Visit: Payer: Self-pay | Admitting: Family Medicine

## 2023-12-14 DIAGNOSIS — J385 Laryngeal spasm: Secondary | ICD-10-CM | POA: Diagnosis not present

## 2023-12-14 NOTE — Telephone Encounter (Signed)
 Last fill: 09/17/23 Dispense Quantity: 135 tablet Refills: 1

## 2023-12-17 ENCOUNTER — Other Ambulatory Visit: Payer: Self-pay

## 2023-12-17 ENCOUNTER — Other Ambulatory Visit: Payer: Self-pay | Admitting: Family Medicine

## 2023-12-17 MED ORDER — CLONAZEPAM 0.5 MG PO TABS: ORAL_TABLET | ORAL | 1 refills | Status: DC

## 2023-12-17 NOTE — Telephone Encounter (Signed)
 Copied from CRM #[id number]. Topic: Clinical - Medication Refill >> Dec 17, 2023  9:03 AM [name] wrote: Medication: clonazePAM  (KLONOPIN ) 0.5 MG tablet [  Has the patient contacted their pharmacy? No (Agent: If no, request that the patient contact the pharmacy for the refill. If patient does not wish to contact the pharmacy document the reason why and proceed with request.) (Agent: If yes, when and what did the pharmacy advise?)  This is the patient's preferred pharmacy:  [facility] [id number] [place], [place] - [address] [place] [address] Phone: [phone] Fax: [phone]  Is this the correct pharmacy for this prescription? Yes If no, delete pharmacy and type the correct one.    Is the patient out of the medication? Yes  Has the patient been seen for an appointment in the last year OR does the patient have an upcoming appointment? Yes  Can we respond through MyChart? No  Agent: Please be advised that Rx refills may take up to 3 business days. We ask that you follow-up with your pharmacy. >> Dec 17, 2023  9:23 AM [name] wrote: Patient is calling in again to stress the urgency of receiving this prescription via her pharmacy [name] as soon as possible. The patient leaves to go out of [place] country on Wednesday early morning 7 am, so she needs it by Tuesday 12/18/2023.  Callback Number: [phone]

## 2023-12-17 NOTE — Telephone Encounter (Unsigned)
 Copied from CRM #[id number]. Topic: Clinical - Medication Refill >> Dec 17, 2023  9:03 AM [name] wrote: Medication: clonazePAM  (KLONOPIN ) 0.5 MG tablet [  Has the patient contacted their pharmacy? No (Agent: If no, request that the patient contact the pharmacy for the refill. If patient does not wish to contact the pharmacy document the reason why and proceed with request.) (Agent: If yes, when and what did the pharmacy advise?)  This is the patient's preferred pharmacy:  [facility] [id number] [place], [place] - [address] [place] [address] Phone: [phone] Fax: [phone]  Is this the correct pharmacy for this prescription? Yes If no, delete pharmacy and type the correct one.    Is the patient out of the medication? Yes  Has the patient been seen for an appointment in the last year OR does the patient have an upcoming appointment? Yes  Can we respond through MyChart? No  Agent: Please be advised that Rx refills may take up to 3 business days. We ask that you follow-up with your pharmacy.

## 2023-12-17 NOTE — Telephone Encounter (Signed)
 Patient traveling and needing refill asap Requesting rx rf of clonazepam  0.5mg  Last written 08/14/2023 Last OV 08/14/2023 Upcoming appt = none

## 2023-12-19 NOTE — Telephone Encounter (Signed)
 Last read by Rock Flossie Clamp at 4:38PM on 12/17/2023.

## 2024-01-02 DIAGNOSIS — Z23 Encounter for immunization: Secondary | ICD-10-CM | POA: Diagnosis not present

## 2024-01-13 NOTE — Progress Notes (Signed)
   NEUROPSYCHOLOGICAL EVALUATION [place]  Physical Medicine and Rehabilitation     Patient: Tamara Pope  MRN: [id number] DOB: 1945-05-19   Service Provider/Clinical Neuropsychologist: Evalene DOROTHA Riff, PsyD  Date of Service: 11/13/23 Start Time: 2 PM End Time: 3 PM  Location of Service:  [facility] Physical Medicine & Rehabilitation Department [place] [address], [place] [place], [place] [address] Phone: [phone]   Billing Code/Service: [phone]    Individuals present: Patient, spouse, Provider Laurier DOROTHA Riff, PsyD)  Provider conducted the 60-minute interactive feedback appointment in-person with the patient accompanied by her spouse with her permission.  The provider reviewed and discussed the results of neuropsychological evaluation. Follow-up interviewing was conducted as needed to refine interpretation of findings as needed. Review of results included overall findings, diagnosis, and treatment planning/recommendations that were derived from integration of patient data, interpretation of standardized rest results and clinical data, and clinical decision making, which are documented in the patient's electronic medical record with the full report (date listed below). A copy of the full report will also be mailed to the patient.   The patient expressed understanding of the information reviewed. The patient was provided opportunity to ask questions which were then answered by the provider. The provider worked collaboratively to tailor treatment recommendations to the patient when possible. The patient was informed they could reach out to the provider should additional questions related to the evaluation arise.    The final neuropsychological evaluation report, documented in the patient's chart (DATE 11/08/23), was amended to reflect any additional information obtained during the feedback appointment including treatment planning  collaboration.    This report was generated using voice recognition software. While this document has been carefully reviewed, transcription errors may be present. I apologize in advance for any inconvenience. Please contact me if further clarification is needed.             Evalene DOROTHA Riff, PsyD             Neuropsychologist

## 2024-01-13 NOTE — Progress Notes (Signed)
 NEUROPSYCHOLOGICAL EVALUATION Tamara Pope. [facility]  Physical Medicine and Rehabilitation     Patient: Tamara Pope  MRN: [id number] DOB: 1945/10/31  Age: 78 y.o. Sex: female  Race/Ethnicity: White or Caucasian  Years of Education: 14 Handedness: Right  Collateral Information Source: Spouse Tamara Pope)  Referring Provider: Alvia Bring, DO  Provider/Clinical Neuropsychologist: Evalene DOROTHA Riff, PsyD  Date of Service: 11/08/2023 Start Time: 8 AM End Time: 9 AM  Location of Service:  [facility] Physical Medicine & Rehabilitation Department [place] [address] [place], [place] [address] Phone: [phone]  Billing Code/Service: [phone]  Individuals Present: Tamara Riff, PsyD 1 hour was spent on interpretation of patient data, interpretation of standardized test results and clinical data, clinical decision making, initial treatment planning/recommendations, and report writing. The report will be amended as needed based on any additional information collected during interactive feedback session.   REASON FOR REFERRAL:The patient was referred for neuropsychological evaluation by her primary care provider, Dr. Alvia, pm 08/19/2023 due to concerns for cognitive changes involving increased forgetfulness  Referring provider records indicated in August 2024 MRI showed moderate small vessel disease. Chronic anxiety was also posed as a possible contributor to her cognitive difficulties as well. that chronic anxiety. The patient's medical history includes spasmodic Dysphonia, arthritis, essential hypertension, and hyperlipidemia.    Upon interview, the patient indicated she was interested in pursuing the evaluation to help determine the cause of her cognitive difficulties. Specifically, she wonders if difficulties are due to anxiety or if they reflect other etiology.   HISTORY OF PRESENTING CONCERNS:  Symptom Onset & Course: The patient  indicated she first noticed cognitive changes approximately 6 months ago.  She described a gradual onset and is unsure whether course is progressively worsening. The patient's spouse indicated that he felt that cognitive symptoms began around 3 years ago with gradual onset and progressive course.  He noted that, per his perspective, anxiety also worsened around this time.  He felt that both cognitive and anxiety difficulties coincided with difficulties linked to spasmodic dysphonia.  The patient describes significant psychosocial struggles related to the condition as there are extended periods (sometimes weeks), post-botox treatment, in which her speech volume is limited dramatically. She is, per her and spouse report, a very social person and verbal communication difficulties are exceptionally frustrating and limiting to her quality of life.  The patient acknowledges some trait-like characteristics related to anxiety which have been long running; tendency towards perfectionism, emphasis on keeping the environment very neat and organized, and described herself as having some OCD tendencies. She described concerns about making errors driving her to ask questions repeatedly or rechecking things repeatedly (appointments, etc.)   Current Cognitive Complaints:  Memory: Minimal short-term memory difficulties overall. One instance of repeating a bill payment (2x) but no other problems in managing finances were noted.  Based on follow-up questioning, problems with asking the same question repeatedly to her spouse appear secondary to anxiety rather than rapid information loss.  No significant difficulties noted with remembering conversations, recent events, getting lost in familiar places, or remembering medications.  Some slight difficulties with misplacing things that appear to be secondary to attention. Processing Speed: No changes. Attention & Concentration: Patient describes some mild difficulties with attention  and concentration such that she recognizes that if she places something somewhere and does not attend adequately to where she placed it (in that moment) she will struggle to find it later.  She denies any significant difficulties  with sustained attention, distraction, or frequently losing train of thought. Language: No significant difficulties with expressive language aside from interference from dysphonia.  No significant indications of receptive language difficulties or changes.  No problems with reading (reads frequently for enjoyment), writing, or basic mental arithmetic. Visual-Spatial: No clear indications of changes in visual spatial abilities. Executive Functioning: No significant indications of problems with problem solving, decision making, planning, organization, or indications of behavioral/personality changes.   Motor/Sensory Complaints:   Sensory changes: No significant changes in sense of smell or taste.  No hearing difficulties.  No unaccounted for changes in vision.  Balance/coordination difficulties: None. Frequent instances of dizziness/vertigo: Infrequent lightheadedness. Other motor difficulties: No difficulties with tremor.  Emotional and Behavioral Functioning:   Depression: She denied any indications or frequent feelings of sadness, hopelessness, anhedonia, suicidal ideation, or other indications of depression.  She denied any significant depression history. Anxiety: First onset of difficulties with anxiety began around the age of 13, coinciding with the loss of her father.  She describes concern/worry about making mistakes, feeling to accomplish things, and describes some indications of self-doubt.  Anxiety appears to be elevated over the last 3 years, per husbands report, even outside more acute exacerbation linked to losing her voice.  She has a remote history of panic attacks but none in recent years. Other: She describes some minimal checking behavior at present but time  spent on this behavior is minimal and that clearly causing clinically significant distress or impairment in functioning.  The patient denied any current or past suicidal ideation, homicidal ideation, hallucinations, or paranoia. Treatment: The patient has never been involved in individual therapy outside of couples therapy.  She has reportedly been trialed on multiple medications for anxiety and there was some concern for cognitive side effects of these medications and motion towards reducing doses as a result.   Sleep:  Estimated hours obtained each night: 8-10 Difficulties falling asleep: None Difficulties staying asleep: None Feels rested and refreshed upon awakening: Yes History of obstructive sleep apnea: No Instances of acting out her dreams: None Appetite: Good Caffeine: None Alcohol Use: One to two drinks approximately 4x per week when going out to eat.  Tobacco Use: None Recreational Substance Use: None  Level of Functional Independence: The patient is intact with basic activities of daily living.  Finances: As noted, one instance when a bill was paid to extra times.  Functioning is otherwise intact in this area.  Shopping / Meal Preparation: Intact Household Maintenance / Chores: Therapist, nutritional / Future Obligations: No difficulties with tracking and making it to medical appointments independently. Medication Management: Intact Driving: Intact  Medical History/Record Review: Per records and patient report; History of traumatic brain injury/concussion: None History of stroke: None History of heart attack: None History of cancer/chemotherapy: None History of seizure activity: None Symptoms of chronic pain: None Experience of frequent headaches/migraines: None  Past Medical History:  Diagnosis Date   Allergy    Anxiety 1990   Arthritis    Dysphonia, spasmodic    Essential hypertension 02/08/2021   GERD (gastroesophageal reflux disease)    History of DVT  (deep vein thrombosis)    JAN 2002-- S/P BREAST IMPLANTS  RIGHT UPPER ARM (AXILLARY/ SUBCLAVIN VEIN)   HLD (hyperlipidemia) 01/03/2018   PMB (postmenopausal bleeding)    Patient Active Problem List   Diagnosis Date Noted   Forgetfulness 08/19/2023   Disturbance of skin sensation 11/01/2022   Tinnitus 11/01/2022   Palpitations 09/01/2021   Essential hypertension 02/08/2021  Osteopenia 09/21/2020   Wrist fracture 03/21/2019   History of 2019 novel coronavirus disease (COVID-19) 02/15/2019   Spastic dysphonia 12/10/2018   HLD (hyperlipidemia) 01/03/2018   Spasmodic dysphonia 10/11/2017   Allergic rhinitis 10/11/2017   GAD (generalized anxiety disorder) 10/11/2017   Laryngospasms 02/27/2017   Arthritis of midfoot 05/04/2016   S/P total knee arthroplasty 05/03/2015   Minor opacity of both corneas 08/16/2014   Osteoarthritis of knee 12/21/2011   Tear of medial meniscus of knee, current 12/21/2011   Status post cataract extraction and insertion of intraocular lens 11/14/2011   Status post LASIK surgery 10/11/2011  Imaging/Lab Results:  11/03/22 EXAM: MRI HEAD WITHOUT AND WITH CONTRAST FINDINGS: Brain: IMPRESSION: 1. No evidence of an acute intracranial abnormality. 2. No cerebellopontine angle or internal auditory canal mass. 3. No specific etiology of hearing loss or tinnitus is identified. 4. Moderate chronic small vessel ischemic changes within the cerebral white matter. Family Neurologic/Medical Hx:  Family History  Problem Relation Age of Onset   Arthritis Mother    Hypertension Mother    Lung cancer Father    Cancer Father    Diabetes Brother        age 51   Birth defects Brother    Arthritis Maternal Grandmother    Heart disease Maternal Grandmother    Heart attack Paternal Grandmother    Colon cancer Neg Hx    Stomach cancer Neg Hx    Medications:  Biotin 5000 MCG TABS busPIRone  (BUSPAR ) 10 MG tablet  Calcium  Carb-Cholecalciferol 600-800 MG-UNIT  TABS Cholecalciferol (VITAMIN D3) 2000 units TABS clonazePAM  (KLONOPIN ) 0.5 MG tablet escitalopram  (LEXAPRO ) 10 MG tablet -  estradiol (ESTRACE) 0.1 MG/GM vaginal cream fluticasone  (FLONASE ) 50 MCG/ACT nasal spray metoprolol  succinate (TOPROL -XL) 25 MG 24 hr tablet Probiotic Product (ALIGN) 4 MG CAPS traZODone  (DESYREL ) 50 MG tablet  valsartan  (DIOVAN ) 40 MG tablet -  Academic/Vocational History: Highest level of educational attainment: Associates degree for medical assistant. Performed slightly above average in school.  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 Stepped down from those responsibilities a number of years ago.   Psychosocial: Marital Status: Married to her husband for 58 years. Children/Grandchildren: Two adult daughters and two grandchildren. Living Situation: Lives alone with spouse in their home. Daily Activities/Hobbies: Traveling, woman's club, going to [employer], going to church.  Mental Status/Behavioral Observations (10/18/23): The patient was seen on an outpatient basis in the [facility] PM&R office for the clinical interview accompanied by her spouse with her permission. Sensorium/Arousal: No clear sensory difficulties.  Alert. Orientation: Full. Appearance: Appropriate dress and hygiene. Behavior: Within normal limits. Speech/Language: Expressive speech was prosodic, fluent, and well-articulated.  No difficulties with tone or volume.  Receptive language appeared grossly intact based on behavioral observations. Motor: Ambulated independently without issue.  No tremor. Social Comportment: Appropriate for the setting. Mood: Neutral to anxious. Appeared more relaxed by end of interview.   Affect: Congruent. Thought Process/Content: Coherent, linear, and goal directed. Ability to Participate in Interview: Intact Insight: Within normal  limits.  NEUROPSYCHODIAGNOSTIC FINDINGS: Behavioral Observations (11/06/23)  The patient was mostly oriented to self, place, and time (could not provide exact name of medical organization). Her hearing and vision were adequate for testing. She ambulated independently and without issue. No hand tremor was noted during testing. Her speech was prosodic, fluent, and well-articulated. She displayed no clear indications of word-finding difficulties in  conversational speech and no notable paraphasic errors were noted. Receptive language appeared intact. The patient's affect was congruent with mood and her mood was largely neutral to anxious. The patient was alert and participated in testing as instructed. She was cooperative throughout the session. Her pace was steady. She showed moderate difficulties with frustration tolerance throughout the testing session and reported significant feelings of anxiety related to the assessment. Other social interactions were unremarkable and consistent with the setting. No frank attentional lapses were appreciated.  Tests Administered: Brief Visuospatial Memory Test-Revised (BVMT-R) Clock Drawing Environmental consultant Function System (D-KEFS), select subtests Hopkins Verbal Learning Test - Revised (HVLT-R) Repeatable Battery for the Assessment of Neuropsychological Status Update (RBANS) Trail Making Test (TMT; Part A & B) Wechsler Adult Intelligence Scale-Fourth Edition (WAIS-IV), select subtests Wechsler Memory Scale-Fourth Edition (WMS-IV) , select subtests Wechsler Memory Scale-Third Edition (WMS-III), select subtests  Wechsler Test of Adult Reading (WTAR) Geriatric Depression Scale-Short Form (GDS-SF) Geriatric Anxiety Inventory (GAI) COWAT (FAS, Animals) [employer] - 2nd Edition (BNT-2)  Results: Measurement properties of test scores: IQ, Index, and Standard Scores (SS): Mean = 100; Standard Deviation = 15; Scaled Scores (ss): Mean = 10; Standard  Deviation = 3; Z scores (Z): Mean = 0; Standard Deviation = 1; T scores (T); Mean = 50; Standard Deviation = 10  IQ/PREMORBID ESTIMATES   Norm Score Percentile  Range  Wechsler Test of Adult Reading  SS = 113 81 %ile High Average   ATTENTION AND WORKING MEMORY    Norm Score Percentile  Range  WAIS-IV          Digit Span  ss = 9 37 %ile Average   DSF  ss = 8 25 %ile Average   Span:    5      DSB  ss = 10 50 %ile Average   Span:    5      DSS  ss = 9 37 %ile Average   Span:    4     WMS-III          Spatial Span  ss = 9 37 %ile Average   SSF  ss = 10 50 %ile Average   Span:    6      SSB  ss = 9 37 %ile Average   Span:    4      PROCESSING SPEED    Norm Score Percentile  Range  RBANS          Coding  ss = 6 9 %ile Low Average   LANGUAGE    Norm Score Percentile  Range  Boston Naming Test (BNT-2)  t = 52 58 %ile Average  COWAT          FAS  t = 49 45 %ile Average   Animals  t = 49 45 %ile Average   EXECUTIVE FUNCTIONING    Norm Score Percentile  Range  DKEFS - Color-Word Interference          Color Naming  ss = 9 37 %ile Average   Word Reading  ss = 8 25 %ile Average   Inhibition  ss = 9 37 %ile Average   Errors  ss = 11 63 %ile Average   Inhibition Switching  ss = 8 25 %ile Average   Errors  ss = 3 1 %ile Exceptionally Low  Trails A  t = 45 30 %ile Average  Trails B  t = 45 30 %ile Average  MEMORY    Norm Score Percentile  Range  BVMT-R          Trial 1  t = 32.0 4 %ile Below Average   Trial 2  t = 22 0.2 %ile Exceptionally Low   Trial 3  t = <20 <0.1 %ile Exceptionally Low   Total Recall  t = 20.0 0.1 %ile Exceptionally Low   Learning  t = 30.0 2 %ile Below Average   Delayed Recall  t = <20 <0.1 %ile Exceptionally Low   % Retained    0 <1 %ile Exceptionally Low   Hits     >16 %ile WNL   False Alarms     11-16 %ile Low Average   Recognition Discriminability     11-16 %ile Low Average  HVLT          Total Recall  t = 29.0 2 %ile Exceptionally Low   Delayed Recall   t = <20 <0.1 %ile Exceptionally Low   %Retention  t = <20 <0.1 %ile Exceptionally Low   Recognition Discriminability  t = 32.0 4 %ile Below Average  Wechsler Memory Scale, 4th Edition (WMS-4)         Log. Mem. Immediate Recall  ss = 7 16 %ile Low Average   Logical Memory Delayed Recall  ss = 5 5 %ile Below Average   Logical Recognition    10th-16th  %ile Low Average   VISUAL-SPATIAL    Norm Score Percentile  Range  Clock       (See Below)            RBANS Visuospatial Index          RBANS Figure Copy  ss = 7 16 %ile Low Average   RBANS Line Orientation      >75th %ile   WNL   PERSONALITY AND BEHAVIORAL FUNCTIONING      Score/Interpretation  GDS-SF Raw       2  GDS-SF Severity       Minimal.  GAI Raw       5  GAI Severity       Minimal.   SUMMARY / CLINICAL IMPRESSIONS The patient was referred for neuropsychological evaluation by her primary care provider, Dr. Alvia, pm 08/19/2023 due to concerns for cognitive changes involving increased forgetfulness  Referring provider records indicated in August 2024 MRI showed moderate small vessel disease. Chronic anxiety was also posed as a possible contributor to her cognitive difficulties as well. The patient's medical history includes spasmodic Dysphonia, arthritis, essential hypertension, and hyperlipidemia.  Upon interview, the patient indicated she was interested in pursuing the evaluation to help determine the cause of her cognitive difficulties. Specifically, she wonders if difficulties are due to anxiety or if they reflect other etiology.   The patient indicated she first noticed cognitive changes approximately 6 months ago.  She described a gradual onset and is unsure whether course is progressively worsening. The patient's spouse indicated that he felt that cognitive symptoms began around 3 years ago with gradual onset and progressive course.  He noted that, per his perspective, anxiety also worsened around this time.  He felt that both  cognitive and anxiety difficulties coincided with difficulties linked to spasmodic dysphonia.  The patient describes significant psychosocial struggles related to the condition as there are extended periods (sometimes weeks), post-botox treatment, in which her speech volume is limited dramatically. She is, per her and spouse report, a very social person and verbal communication difficulties are exceptionally  frustrating and limiting to her quality of life.  The patient describes relatively minimal cognitive difficulties subjectively.  There are some indications of anxiety related interference. Instrumental and basic activities of daily living appear intact overall.  Given the patient's age and imaging findings would remain appropriate to proceed with cognitive testing to assess for any possible unidentified etiologies contributing to her difficulties.  On testing, the patient's performances on validity metrics were within normal limits, although there are some concerns for anxiety related interference which could limit test reliability. Details and implications are discussed in the paragraph below. In the current evaluation, the patient's premorbid cognitive abilities are estimated to be average to high average.  Her scores were consistently within the average range on both auditory-verbal and visual attention tests.  Her performance on a measure of processing speed involving rapid symbol digit translation was scored in the low average range.  On measures of executive functioning she had no difficulty with speed or accuracy on a basic response inhibition task.  Her accuracy fell to a surprising/atypical degree when a set shifting element was added to response inhibition.  However, she had no difficulty with sequencing with or without set shifting.  The patient's performance on a basic visual perception task was high average although her figure copy was scored in the low average range.  Examination of her figure  copy showed primarily issues related to minor attention to detail errors.  She had some difficulty with her clock drawing in terms of time designation but not visual planning or construction. Self-report measures of depression and anxiety were score din the minimal score range, although there is concern for under-report within anxiety. The patient's scores on memory testing were variable, but largely well below expectations. On the visual memory test, she showed no improvement from repetition and was below expectations on all metrics (immediate recall/total recall, delayed free recall, % retention) but her performance improved significantly on the recognition task. Her immediate recall was better on a prose (story) auditory-verbal memory test, albeit still below expectations with a low average score. She had difficulty with delayed free recall, but again showed slight improvement with cueing on the recognition task. Her performance on the word-list memory test showed difficulties with learning, absence of gains from repetition, and problems with delayed free recall. She showed minor improvement on the recognition trial, but she had several false positive errors as well.   Performance validity metrics were within normal limits, but level of inconsistency between memory deficits implied by testing and the patient's functioning (instrumental activities of daily living, behavioral observations during interview and feedback) is considerable. Behavioral observations during cognitive testing are notable for significant anxiety throughout the testing session and even periods of tearfulness. There is a reasonable likelihood that test scores were impacted by anxiety to some degree. However, given that difficulties were consistent across all memory measures, and given the presence of cerebrovascular changes seen in imaging which could readily impact cognitive abilities, a minor neurocognitive disorder diagnosis is made out  of an abundance of caution. Indications of slowed processing and memory difficulties that appear more dysexecutive than amnestic fit with cerebrovascular disease. However, scores within executive functioning and attention are a bit better than would be expected. These inconsistencies could reflect variable impacts of anxiety. It is possible that through improvements in anxiety cognitive functioning improves significantly, even such that a minor neurocognitive disorder diagnosis is not appropriate. Working towards reducing symptoms of anxiety is therefore recommended, both for improving overall quality of  life, but also for reducing the potential impact on cognitive functioning. Re-evaluation (~75-months) would be appropriate for both monitoring for any indications of cognitive decline and increasing certainty around diagnosis. Working on mental health in the interim is recommended. Concerns for Alzheimer's disease warrants mention given prominent memory difficulties. Test data do not show compelling evidence of AD at this time. Memory deficits appear more dysexecutive than amnestic, and language performances are intact for both naming and verbal fluency. Overall, I suspect difficulties are more likely a combination of significant anxiety and cerebrovascular to some degree. Monitoring and re-evaluation would be appropriate if desired. Reductions in anxiety through treatment is recommended.    Diagnosis: Minor neurocognitive disorder, unspecified Anxiety disorder, unspecified  Recommendations:  Follow-up with the referring provider as planned.  At this point, a minor neurocognitive disorder diagnosis is made with some caution. The consistency of difficulties within memory, possible declines in processing speed, possible consistency with cerebrovascular changes, but intact instrumental activities of daily living align with the diagnosis. However, slightly atypical focal nature of difficulties in testing  (dysexecutive type memory difficulties, but largely intact executive test scores. Intact attention performances), indications of acute anxiety during testing, and incongruence between scores on testing and real-world functioning raise suspicion that difficulties may be related to interference from secondary factors (I.e., anxiety). If desired, a re-evaluation in 20-months could be conducted to provide additional clarity. Engaging with mental health providers for individual therapy is recommended, as discussed during feedback, given concerns that anxiety maybe a significant factor in cognitive difficulties. The patient indicated plan to connected with her current physician to inquire about possible options/recommendations for individual therapy. If there are any difficulties finding a provider, www.psychologytoday.com has a Proofreader of mental health providers that may be useful.  As discussed during feedback, if the patient has any concerns about cognitive side effects from medications, discussing these concerns with the prescribing physician is appropriate. Decisions regarding medication are deferred to the prescribing physician and patient. Continued engagement with medical health providers for management of chronic medical conditions is recommended. Management of cardiovascular health conditions is particualrly important recommended the relationship between cardiovascular health (heart health) and cerebrovascular health (brain health).  The MIND diet, a modified mediterranean diet, has been found to be beneficial for reducing risk of cognitive decline and may be beneficial for promoting brain health. An internet search for MIND diet will provide many resources with additional information if interested.  Engaging in cognitively stimulating activities is beneficial for cognitive health and reducing risk of increased cognitive decline. Cognitively stimulating activities are essentially  anything that requires your active participation and some level of concentration. It can take many forms. Social interaction is particularly stimulating, for example. Reading, puzzles, and even day to day problem solving are also some examples. Find what works for you!    It was a pleasure meeting with the patient and her spouse. Please don't hesitate to reach out with any questions upon receipt of this report!             Tamara DOROTHA Riff, PsyD             Neuropsychologist This report was generated using voice recognition software. While this document has been carefully reviewed, transcription errors may be present. I apologize in advance for any inconvenience. Please contact me if further clarification is needed.

## 2024-03-06 DIAGNOSIS — F0280 Dementia in other diseases classified elsewhere without behavioral disturbance: Secondary | ICD-10-CM | POA: Diagnosis not present

## 2024-03-06 DIAGNOSIS — G3184 Mild cognitive impairment, so stated: Secondary | ICD-10-CM | POA: Diagnosis not present

## 2024-03-06 DIAGNOSIS — G309 Alzheimer's disease, unspecified: Secondary | ICD-10-CM | POA: Diagnosis not present

## 2024-03-06 DIAGNOSIS — R58 Hemorrhage, not elsewhere classified: Secondary | ICD-10-CM | POA: Diagnosis not present

## 2024-03-06 DIAGNOSIS — Z1331 Encounter for screening for depression: Secondary | ICD-10-CM | POA: Diagnosis not present

## 2024-03-12 ENCOUNTER — Other Ambulatory Visit: Payer: Self-pay | Admitting: Family Medicine

## 2024-04-07 ENCOUNTER — Other Ambulatory Visit: Payer: Self-pay | Admitting: Family Medicine

## 2024-04-07 NOTE — Telephone Encounter (Unsigned)
 Copied from CRM [id number]. Topic: Clinical - Medication Refill >> Apr 07, 2024  1:13 PM Dominique A wrote: Medication: traZODone  (DESYREL ) 50 MG tablet  Has the patient contacted their pharmacy? Yes Pharmacy stated the PCP would need to send the prescription over.   This is the patient's preferred pharmacy:  [facility] [id number] [place], [place] - [address] [place] [address] Phone: [phone] Fax: [phone]  Is this the correct pharmacy for this prescription? Yes   Has the prescription been filled recently? Yes  Is the patient out of the medication? No  Has the patient been seen for an appointment in the last year OR does the patient have an upcoming appointment? Yes  Can we respond through MyChart? No. Patient would like a phone call.   Agent: Please be advised that Rx refills may take up to 3 business days. We ask that you follow-up with your pharmacy.

## 2024-04-08 NOTE — Telephone Encounter (Signed)
 Requesting rx rf of Trazadone 50mg   Last written 11/09/2023 Last OV 08/14/2023 Upcoming appt = none

## 2024-04-09 MED ORDER — TRAZODONE HCL 50 MG PO TABS: 50.0000 mg | ORAL_TABLET | Freq: Every evening | ORAL | 0 refills | Status: AC | PRN

## 2024-04-15 ENCOUNTER — Other Ambulatory Visit: Payer: Self-pay | Admitting: Family Medicine

## 2024-04-18 ENCOUNTER — Other Ambulatory Visit: Payer: Self-pay | Admitting: Family Medicine

## 2024-04-20 ENCOUNTER — Other Ambulatory Visit: Payer: Self-pay | Admitting: Family Medicine

## 2024-04-20 DIAGNOSIS — G300 Alzheimer's disease with early onset: Secondary | ICD-10-CM

## 2024-04-30 ENCOUNTER — Other Ambulatory Visit (HOSPITAL_COMMUNITY): Payer: Self-pay | Admitting: Geriatric Medicine

## 2024-04-30 DIAGNOSIS — F0280 Dementia in other diseases classified elsewhere without behavioral disturbance: Secondary | ICD-10-CM | POA: Insufficient documentation

## 2024-04-30 DIAGNOSIS — G3184 Mild cognitive impairment, so stated: Secondary | ICD-10-CM | POA: Insufficient documentation

## 2024-04-30 DIAGNOSIS — Z1589 Genetic susceptibility to other disease: Secondary | ICD-10-CM | POA: Insufficient documentation

## 2024-04-30 NOTE — Progress Notes (Signed)
 Received referral for [facility] for [facility].  Dx code: G30.1, Z00.6 Dose: 350 mg IV at Week 0, 700 mg IV at Week 4, 1050 mg IV at Week 8,  1400 mg IV at Week 12 every 4 weeks Premedications: acetaminophen  500mg  p.o. and loratidine 10mg  p.o.  Trial registry: CMS: The Anti-A-beta MAb CED Study ([id number]) ID: [id number]  Baseline brain MRI completed on 04/09/2024 (attached to referral) Brain MRIs to be completed prior to 2nd, 3rd, 4th, and 7th infusions, and periodically thereafter   Sherry Pennant, PharmD, MPH, BCPS, CPP Clinical Pharmacist

## 2024-05-01 ENCOUNTER — Telehealth (HOSPITAL_COMMUNITY): Payer: Self-pay | Admitting: Pharmacy Technician

## 2024-05-01 NOTE — Telephone Encounter (Signed)
 Auth Submission: NO AUTH NEEDED Site of care: [facility] Payer: Medicare A/B, BCBS Supp   Medication & CPT/J Code(s) submitted: J0175 - KISUNLA (donanemab-azbt)  Diagnosis Code: G31.84, G30.1, F02.80 Route of submission (phone, fax, portal):  Phone # Fax # Auth type: Buy/Bill HB Units/visits requested: 350MG  every 4 weeks x 1, 700mg  every 4 weeks x 1, 1050mg  every 4 weeks x 1, then 1400mg  every 4 weeks thereafter Reference number:  Approval from: 05/01/2024 to 05/03/25   Tamara Pope, CPhT [facility] Phone: [phone] 05/01/2024

## 2024-05-03 ENCOUNTER — Other Ambulatory Visit (HOSPITAL_BASED_OUTPATIENT_CLINIC_OR_DEPARTMENT_OTHER): Payer: Self-pay | Admitting: Family

## 2024-05-03 DIAGNOSIS — I10 Essential (primary) hypertension: Secondary | ICD-10-CM

## 2024-05-07 ENCOUNTER — Ambulatory Visit: Admitting: Family Medicine

## 2024-05-07 NOTE — Progress Notes (Unsigned)
 " Tamara Pope - 79 y.o. female MRN [id number]  Date of birth: Feb 26, 1946  Subjective Chief Complaint  Patient presents with   Memory Loss    HPI Tamara Pope is a 79 y.o. female  Allergies[1]  Past Medical History:  Diagnosis Date   Allergy    Anxiety 1990   Arthritis    Dysphonia, spasmodic    Essential hypertension 02/08/2021   GERD (gastroesophageal reflux disease)    History of DVT (deep vein thrombosis)    JAN 2002-- S/P BREAST IMPLANTS  RIGHT UPPER ARM (AXILLARY/ SUBCLAVIN VEIN)   HLD (hyperlipidemia) 01/03/2018   PMB (postmenopausal bleeding)     Past Surgical History:  Procedure Laterality Date   APPENDECTOMY  AS  CHILD   AUGMENTATION MAMMAPLASTY Bilateral    20 years ago   BILATERAL BREAST LIFT AND IMPLANT REDUCTION  12/2012   BREAST ENHANCEMENT SURGERY Bilateral 02/2000   CATARACT EXTRACTION W/ INTRAOCULAR LENS  IMPLANT, BILATERAL     COSMETIC SURGERY  1995   breast implants   DILATATION & CURRETTAGE/HYSTEROSCOPY WITH RESECTOCOPE N/A 04/18/2013   Procedure: DILATATION & CURETTAGE/HYSTEROSCOPY ;  Surgeon: Charlie CHRISTELLA Croak, MD;  Location: [facility];  Service: Gynecology;  Laterality: N/A;   EYE SURGERY  2015   cataracts   JOINT REPLACEMENT  2014   Knee replacement   KNEE ARTHROSCOPY W/ MENISCECTOMY Right 2011   ROTATOR CUFF REPAIR Right 2005   skin surgery     TONSILLECTOMY  AS CHILD   TOTAL KNEE ARTHROPLASTY Right 05/03/2015   Procedure: RIGHT TOTAL KNEE ARTHROPLASTY;  Surgeon: Marcey Raman, MD;  Location: [facility];  Service: Orthopedics;  Laterality: Right;    Social History   Socioeconomic History   Marital status: Married    Spouse name: Kyla Duffy   Number of children: 2   Years of education: 14   Highest education level: Associate degree: occupational, scientist, product/process development, or vocational program  Occupational History    Employer: [employer]   Occupation: Retired  Tobacco Use   Smoking status: Never    Smokeless tobacco: Never   Tobacco comments:    None  Vaping Use   Vaping status: Never Used  Substance and Sexual Activity   Alcohol use: Yes    Alcohol/week: 4.0 standard drinks of alcohol    Types: 4 Glasses of wine per week    Comment: 4 glasses of wine a week   Drug use: No   Sexual activity: Not Currently    Partners: Male  Other Topics Concern   Not on file  Social History Narrative   Lives with her husband. She has two daughters. She enjoys dancing and going out to different restaurants.   Social Drivers of Health   Tobacco Use: Low Risk  (04/18/2024)   Received from [facility]   Patient History    Smoking Tobacco Use: Never    Smokeless Tobacco Use: Never    Passive Exposure: Not on file  Financial Resource Strain: Low Risk (08/16/2023)   Overall Financial Resource Strain (CARDIA)    Difficulty of Paying Living Expenses: Not hard at all  Food Insecurity: No Food Insecurity (08/16/2023)   Hunger Vital Sign    Worried About Running Out of Food in the Last Year: Never true    Ran Out of Food in the Last Year: Never true  Transportation Needs: No Transportation Needs (08/16/2023)   PRAPARE - Administrator, Civil Service (Medical): No  Lack of Transportation (Non-Medical): No  Physical Activity: Sufficiently Active (08/16/2023)   Exercise Vital Sign    Days of Exercise per Week: 4 days    Minutes of Exercise per Session: 60 min  Recent Concern: Physical Activity - Insufficiently Active (08/12/2023)   Exercise Vital Sign    Days of Exercise per Week: 2 days    Minutes of Exercise per Session: 60 min  Stress: No Stress Concern Present (08/16/2023)   [employer] of Occupational Health - Occupational Stress Questionnaire    Feeling of Stress : Only a little  Social Connections: Socially Integrated (08/16/2023)   Social Connection and Isolation Panel    Frequency of Communication with Friends and Family: Three times a week    Frequency  of Social Gatherings with Friends and Family: Twice a week    Attends Religious Services: More than 4 times per year    Active Member of Clubs or Organizations: Yes    Attends Banker Meetings: More than 4 times per year    Marital Status: Married  Depression (PHQ2-9): Low Risk (05/07/2024)   Depression (PHQ2-9)    PHQ-2 Score: 1  Alcohol Screen: Low Risk (08/16/2023)   Alcohol Screen    Last Alcohol Screening Score (AUDIT): 3  Housing: Unknown (03/06/2024)   Received from [facility]   Epic    Unable to Pay for Housing in the Last Year: Not on file    Number of Times Moved in the Last Year: Not on file    At any time in the past 12 months, were you homeless or living in a shelter (including now)?: No  Utilities: Not At Risk (08/16/2023)   [facility] Utilities    Threatened with loss of utilities: No  Health Literacy: Adequate Health Literacy (08/16/2023)   B1300 Health Literacy    Frequency of need for help with medical instructions: Never    Family History  Problem Relation Age of Onset   Arthritis Mother    Hypertension Mother    Lung cancer Father    Cancer Father    Diabetes Brother        age 54   Birth defects Brother    Arthritis Maternal Grandmother    Heart disease Maternal Grandmother    Heart attack Paternal Grandmother    Colon cancer Neg Hx    Stomach cancer Neg Hx     Health Maintenance  Topic Date Due   DTaP/Tdap/Td (2 - Td or Tdap) 11/04/2019   Mammogram  07/08/2022   COVID-19 Vaccine (4 - 2025-26 season) 12/01/2024 (Originally 12/03/2023)   Medicare Annual Wellness (AWV)  08/15/2024   Pneumococcal Vaccine: 50+ Years  Completed   Influenza Vaccine  Completed   Bone Density Scan  Completed   Zoster Vaccines- Shingrix  Completed   Meningococcal B Vaccine  Aged Out   Colonoscopy  Discontinued   Hepatitis C Screening  Discontinued      ----------------------------------------------------------------------------------------------------------------------------------------------------------------------------------------------------------------- Physical Exam BP (!) 182/85   Pulse 78   Ht 5' 4 (1.626 m)   Wt 141 lb (64 kg)   SpO2 96%   BMI 24.20 kg/m   Physical Exam  ------------------------------------------------------------------------------------------------------------------------------------------------------------------------------------------------------------------- Assessment and Plan  No problem-specific Assessment & Plan notes found for this encounter.   No orders of the defined types were placed in this encounter.   No follow-ups on file.        [1]  Allergies Allergen Reactions   Bee Venom Anaphylaxis   Wasp Venom Anaphylaxis  Pollen Extract Cough   "

## 2024-06-16 ENCOUNTER — Encounter (HOSPITAL_COMMUNITY)

## 2024-06-17 ENCOUNTER — Encounter (HOSPITAL_COMMUNITY)

## 2024-07-17 ENCOUNTER — Encounter (HOSPITAL_COMMUNITY)

## 2024-08-15 ENCOUNTER — Encounter (HOSPITAL_COMMUNITY)

## 2024-08-19 ENCOUNTER — Ambulatory Visit

## 2024-09-15 ENCOUNTER — Encounter (HOSPITAL_COMMUNITY)

## 2024-10-15 ENCOUNTER — Encounter (HOSPITAL_COMMUNITY)

## 2024-11-14 ENCOUNTER — Encounter (HOSPITAL_COMMUNITY)
